# Patient Record
Sex: Male | Born: 1940
Health system: Southern US, Community
[De-identification: ages and names within clinical notes are randomized; demographics above are authoritative.]

## PROBLEM LIST (undated history)

## (undated) DIAGNOSIS — R319 Hematuria, unspecified: Secondary | ICD-10-CM

## (undated) DIAGNOSIS — N529 Male erectile dysfunction, unspecified: Secondary | ICD-10-CM

## (undated) DIAGNOSIS — M1611 Unilateral primary osteoarthritis, right hip: Secondary | ICD-10-CM

## (undated) DIAGNOSIS — M1612 Unilateral primary osteoarthritis, left hip: Secondary | ICD-10-CM

## (undated) DIAGNOSIS — K219 Gastro-esophageal reflux disease without esophagitis: Secondary | ICD-10-CM

## (undated) DIAGNOSIS — N281 Cyst of kidney, acquired: Secondary | ICD-10-CM

## (undated) DIAGNOSIS — R519 Headache, unspecified: Secondary | ICD-10-CM

## (undated) DIAGNOSIS — N400 Enlarged prostate without lower urinary tract symptoms: Secondary | ICD-10-CM

## (undated) DIAGNOSIS — R51 Headache: Secondary | ICD-10-CM

## (undated) DIAGNOSIS — C4490 Unspecified malignant neoplasm of skin, unspecified: Secondary | ICD-10-CM

## (undated) DIAGNOSIS — J45909 Unspecified asthma, uncomplicated: Secondary | ICD-10-CM

## (undated) DIAGNOSIS — M1990 Unspecified osteoarthritis, unspecified site: Secondary | ICD-10-CM

## (undated) DIAGNOSIS — I10 Essential (primary) hypertension: Secondary | ICD-10-CM

## (undated) DIAGNOSIS — C61 Malignant neoplasm of prostate: Secondary | ICD-10-CM

## (undated) DIAGNOSIS — R911 Solitary pulmonary nodule: Secondary | ICD-10-CM

## (undated) DIAGNOSIS — D179 Benign lipomatous neoplasm, unspecified: Secondary | ICD-10-CM

## (undated) DIAGNOSIS — Z974 Presence of external hearing-aid: Secondary | ICD-10-CM

## (undated) DIAGNOSIS — J302 Other seasonal allergic rhinitis: Secondary | ICD-10-CM

## (undated) DIAGNOSIS — Z87438 Personal history of other diseases of male genital organs: Secondary | ICD-10-CM

## (undated) HISTORY — PX: LIPOMA EXCISION: SHX5283

## (undated) HISTORY — PX: SHOULDER ARTHROSCOPY: SHX128

## (undated) HISTORY — PX: TONSILLECTOMY: SUR1361

## (undated) HISTORY — PX: KNEE ARTHROSCOPY: SUR90

---

## 1998-01-17 ENCOUNTER — Ambulatory Visit (HOSPITAL_BASED_OUTPATIENT_CLINIC_OR_DEPARTMENT_OTHER): Admission: RE | Admit: 1998-01-17 | Discharge: 1998-01-17 | Payer: Self-pay | Admitting: Orthopedic Surgery

## 1999-01-09 ENCOUNTER — Ambulatory Visit (HOSPITAL_BASED_OUTPATIENT_CLINIC_OR_DEPARTMENT_OTHER): Admission: RE | Admit: 1999-01-09 | Discharge: 1999-01-09 | Payer: Self-pay | Admitting: General Surgery

## 2003-01-12 DIAGNOSIS — J45909 Unspecified asthma, uncomplicated: Secondary | ICD-10-CM

## 2003-01-12 HISTORY — DX: Unspecified asthma, uncomplicated: J45.909

## 2003-02-16 ENCOUNTER — Ambulatory Visit (HOSPITAL_COMMUNITY): Admission: RE | Admit: 2003-02-16 | Discharge: 2003-02-16 | Payer: Self-pay | Admitting: Family Medicine

## 2008-04-15 DIAGNOSIS — C4491 Basal cell carcinoma of skin, unspecified: Secondary | ICD-10-CM

## 2008-04-15 HISTORY — DX: Basal cell carcinoma of skin, unspecified: C44.91

## 2009-01-27 DIAGNOSIS — C4492 Squamous cell carcinoma of skin, unspecified: Secondary | ICD-10-CM

## 2009-01-27 HISTORY — DX: Squamous cell carcinoma of skin, unspecified: C44.92

## 2012-05-05 ENCOUNTER — Other Ambulatory Visit: Payer: Self-pay | Admitting: Orthopedic Surgery

## 2012-05-17 ENCOUNTER — Encounter (HOSPITAL_COMMUNITY): Payer: Self-pay | Admitting: Pharmacy Technician

## 2012-05-17 ENCOUNTER — Other Ambulatory Visit (HOSPITAL_COMMUNITY): Payer: Self-pay | Admitting: Orthopedic Surgery

## 2012-05-17 NOTE — Patient Instructions (Addendum)
20 URAL ACREE  05/17/2012   Your procedure is scheduled on: 05-23-2012  Report to [facility] at 530 AM.  Call this number if you have problems the morning of surgery [phone]   Remember:   Do not eat food or drink liquids :After Midnight.   Monday night     Take these medicines the morning of surgery with A SIP OF WATER: Zantac                                SEE [facility] PREPARING FOR SURGERY SHEET   Do not wear jewelry, make-up or nail polish.  Do not wear lotions, powders, or perfumes. You may wear deodorant.   Men may shave face and neck.  Do not bring valuables to the hospital.  Contacts, dentures or bridgework may not be worn into surgery.  Leave suitcase in the car. After surgery it may be brought to your room.  For patients admitted to the hospital, checkout time is 11:00 AM the day of discharge.   Patients discharged the day of surgery will not be allowed to drive home.  Name and phone number of your driver:  Special Instructions: N/A   Please read over the following fact sheets that you were given: MRSA Information., blood fact sheet, incentive spirometer fact sheet Call Theodis Aguas RN pre op nurse if needed [phone]    FAILURE TO FOLLOW THESE INSTRUCTIONS MAY RESULT IN THE CANCELLATION OF YOUR SURGERY. PATIENT SIGNATURE___________________________________________

## 2012-05-18 ENCOUNTER — Ambulatory Visit (HOSPITAL_COMMUNITY)
Admission: RE | Admit: 2012-05-18 | Discharge: 2012-05-18 | Disposition: A | Discharge status: Home / Self Care | Payer: Medicare Other | Source: Ambulatory Visit | Attending: Orthopedic Surgery | Admitting: Orthopedic Surgery

## 2012-05-18 ENCOUNTER — Encounter (HOSPITAL_COMMUNITY): Payer: Self-pay

## 2012-05-18 ENCOUNTER — Encounter (HOSPITAL_COMMUNITY)
Admission: RE | Admit: 2012-05-18 | Discharge: 2012-05-18 | Disposition: A | Discharge status: Home / Self Care | Payer: Medicare Other | Source: Ambulatory Visit | Attending: Orthopedic Surgery | Admitting: Orthopedic Surgery

## 2012-05-18 DIAGNOSIS — Z01810 Encounter for preprocedural cardiovascular examination: Secondary | ICD-10-CM | POA: Insufficient documentation

## 2012-05-18 DIAGNOSIS — Z01818 Encounter for other preprocedural examination: Secondary | ICD-10-CM | POA: Insufficient documentation

## 2012-05-18 DIAGNOSIS — Z01812 Encounter for preprocedural laboratory examination: Secondary | ICD-10-CM | POA: Insufficient documentation

## 2012-05-18 DIAGNOSIS — I10 Essential (primary) hypertension: Secondary | ICD-10-CM | POA: Insufficient documentation

## 2012-05-18 DIAGNOSIS — Z0183 Encounter for blood typing: Secondary | ICD-10-CM | POA: Insufficient documentation

## 2012-05-18 HISTORY — DX: Essential (primary) hypertension: I10

## 2012-05-18 HISTORY — DX: Gastro-esophageal reflux disease without esophagitis: K21.9

## 2012-05-18 HISTORY — DX: Unspecified asthma, uncomplicated: J45.909

## 2012-05-18 HISTORY — DX: Unspecified osteoarthritis, unspecified site: M19.90

## 2012-05-18 LAB — CBC
HCT: 43 % (ref 39.0–52.0)
Hemoglobin: 14 g/dL (ref 13.0–17.0)
MCV: 95.1 fL (ref 78.0–100.0)
WBC: 7.2 10*3/uL (ref 4.0–10.5)

## 2012-05-18 LAB — BASIC METABOLIC PANEL
BUN: 15 mg/dL (ref 6–23)
CO2: 25 mEq/L (ref 19–32)
Chloride: 105 mEq/L (ref 96–112)
GFR calc Af Amer: >90 mL/min (ref >90)
Potassium: 3.9 mEq/L (ref 3.5–5.1)

## 2012-05-18 LAB — URINALYSIS, ROUTINE W REFLEX MICROSCOPIC
Ketones, ur: NEGATIVE mg/dL
Leukocytes, UA: NEGATIVE
Nitrite: NEGATIVE
Protein, ur: NEGATIVE mg/dL
Urobilinogen, UA: 0.2 mg/dL (ref 0.0–1.0)

## 2012-05-18 LAB — PROTIME-INR: INR: 0.89 (ref 0.00–1.49)

## 2012-05-18 NOTE — Progress Notes (Signed)
Faxed request for antibiotic clarification pre op to Dr Dion Saucier with confirmation that patient had anaphylatic  Reaction  to Penicillin as a child and Ancef is ordered pre op

## 2012-05-22 LAB — TYPE AND SCREEN: ABO/RH(D): B POS

## 2012-05-23 ENCOUNTER — Encounter (HOSPITAL_COMMUNITY): Payer: Self-pay | Admitting: *Deleted

## 2012-05-23 ENCOUNTER — Inpatient Hospital Stay (HOSPITAL_COMMUNITY)
Admission: RE | Admit: 2012-05-23 | Discharge: 2012-05-25 | DRG: 470 | Disposition: A | Discharge status: Home Health | Payer: Medicare Other | Source: Ambulatory Visit | Attending: Orthopedic Surgery | Admitting: Orthopedic Surgery

## 2012-05-23 ENCOUNTER — Encounter (HOSPITAL_COMMUNITY)
Admission: RE | Disposition: A | Discharge status: Home Health | Payer: Self-pay | Source: Ambulatory Visit | Attending: Orthopedic Surgery

## 2012-05-23 ENCOUNTER — Inpatient Hospital Stay (HOSPITAL_COMMUNITY): Payer: Medicare Other

## 2012-05-23 ENCOUNTER — Inpatient Hospital Stay (HOSPITAL_COMMUNITY): Payer: Medicare Other | Admitting: Certified Registered"

## 2012-05-23 ENCOUNTER — Encounter (HOSPITAL_COMMUNITY): Payer: Self-pay | Admitting: Certified Registered"

## 2012-05-23 DIAGNOSIS — Z79899 Other long term (current) drug therapy: Secondary | ICD-10-CM

## 2012-05-23 DIAGNOSIS — M1610 Unilateral primary osteoarthritis, unspecified hip: Principal | ICD-10-CM | POA: Diagnosis present

## 2012-05-23 DIAGNOSIS — K219 Gastro-esophageal reflux disease without esophagitis: Secondary | ICD-10-CM | POA: Diagnosis present

## 2012-05-23 DIAGNOSIS — M169 Osteoarthritis of hip, unspecified: Principal | ICD-10-CM | POA: Diagnosis present

## 2012-05-23 DIAGNOSIS — Z87891 Personal history of nicotine dependence: Secondary | ICD-10-CM

## 2012-05-23 DIAGNOSIS — I10 Essential (primary) hypertension: Secondary | ICD-10-CM | POA: Diagnosis present

## 2012-05-23 DIAGNOSIS — M1611 Unilateral primary osteoarthritis, right hip: Secondary | ICD-10-CM

## 2012-05-23 HISTORY — DX: Unilateral primary osteoarthritis, right hip: M16.11

## 2012-05-23 HISTORY — PX: TOTAL HIP ARTHROPLASTY: SHX124

## 2012-05-23 LAB — CBC
Hemoglobin: 13 g/dL (ref 13.0–17.0)
MCH: 31.6 pg (ref 26.0–34.0)
MCHC: 33 g/dL (ref 30.0–36.0)
RDW: 15.1 % (ref 11.5–15.5)

## 2012-05-23 LAB — CREATININE, SERUM: Creatinine, Ser: 0.78 mg/dL (ref 0.50–1.35)

## 2012-05-23 SURGERY — ARTHROPLASTY, HIP, TOTAL,POSTERIOR APPROACH
Anesthesia: General | Site: Hip | Laterality: Right | Wound class: Clean

## 2012-05-23 MED ORDER — ACETAMINOPHEN 10 MG/ML IV SOLN: 1000.0000 mg | Freq: Once | INTRAVENOUS | Status: DC | PRN

## 2012-05-23 MED ORDER — WARFARIN SODIUM 5 MG PO TABS
5.0000 mg | ORAL_TABLET | Freq: Once | ORAL | Status: AC
  Administered 2012-05-23: 5 mg via ORAL
  Filled 2012-05-23: qty 1

## 2012-05-23 MED ORDER — LIDOCAINE HCL (PF) 2 % IJ SOLN
INTRAMUSCULAR | Status: DC | PRN
  Administered 2012-05-23: 20 mg

## 2012-05-23 MED ORDER — ZOLPIDEM TARTRATE 5 MG PO TABS: 5.0000 mg | ORAL_TABLET | Freq: Every evening | ORAL | Status: DC | PRN

## 2012-05-23 MED ORDER — SENNA-DOCUSATE SODIUM 8.6-50 MG PO TABS: 1.0000 | ORAL_TABLET | Freq: Every day | ORAL | Status: DC

## 2012-05-23 MED ORDER — OXYCODONE HCL 5 MG PO TABS
5.0000 mg | ORAL_TABLET | ORAL | Status: DC | PRN
  Administered 2012-05-23 – 2012-05-25 (×15): 10 mg via ORAL
  Filled 2012-05-23 (×14): qty 2

## 2012-05-23 MED ORDER — LACTATED RINGERS IV SOLN
INTRAVENOUS | Status: DC | PRN
  Administered 2012-05-23 (×2): via INTRAVENOUS

## 2012-05-23 MED ORDER — SUCCINYLCHOLINE CHLORIDE 20 MG/ML IJ SOLN
INTRAMUSCULAR | Status: DC | PRN
  Administered 2012-05-23: 100 mg via INTRAVENOUS

## 2012-05-23 MED ORDER — POLYETHYLENE GLYCOL 3350 17 G PO PACK
17.0000 g | PACK | Freq: Every day | ORAL | Status: DC | PRN
  Administered 2012-05-25: 17 g via ORAL

## 2012-05-23 MED ORDER — POTASSIUM CHLORIDE IN NACL 20-0.45 MEQ/L-% IV SOLN
INTRAVENOUS | Status: DC
  Administered 2012-05-23: 12:00:00 via INTRAVENOUS
  Filled 2012-05-23 (×8): qty 1000

## 2012-05-23 MED ORDER — ENOXAPARIN SODIUM 30 MG/0.3ML ~~LOC~~ SOLN
30.0000 mg | Freq: Two times a day (BID) | SUBCUTANEOUS | Status: DC
  Filled 2012-05-23 (×3): qty 0.3

## 2012-05-23 MED ORDER — DOCUSATE SODIUM 100 MG PO CAPS
100.0000 mg | ORAL_CAPSULE | Freq: Two times a day (BID) | ORAL | Status: DC
  Administered 2012-05-23 – 2012-05-25 (×4): 100 mg via ORAL

## 2012-05-23 MED ORDER — ALUM & MAG HYDROXIDE-SIMETH 200-200-20 MG/5ML PO SUSP: 30.0000 mL | ORAL | Status: DC | PRN

## 2012-05-23 MED ORDER — CEFAZOLIN SODIUM-DEXTROSE 2-3 GM-% IV SOLR
2.0000 g | INTRAVENOUS | Status: DC
  Administered 2012-05-23: 2 g via INTRAVENOUS

## 2012-05-23 MED ORDER — BISACODYL 10 MG RE SUPP: 10.0000 mg | Freq: Every day | RECTAL | Status: DC | PRN

## 2012-05-23 MED ORDER — HYDROMORPHONE HCL PF 1 MG/ML IJ SOLN
0.2500 mg | INTRAMUSCULAR | Status: DC | PRN
  Administered 2012-05-23 (×4): 0.5 mg via INTRAVENOUS

## 2012-05-23 MED ORDER — METOCLOPRAMIDE HCL 10 MG PO TABS: 5.0000 mg | ORAL_TABLET | Freq: Three times a day (TID) | ORAL | Status: DC | PRN

## 2012-05-23 MED ORDER — BISACODYL 5 MG PO TBEC: 5.0000 mg | DELAYED_RELEASE_TABLET | Freq: Every day | ORAL | Status: DC | PRN

## 2012-05-23 MED ORDER — 0.9 % SODIUM CHLORIDE (POUR BTL) OPTIME
TOPICAL | Status: DC | PRN
  Administered 2012-05-23: 1000 mL

## 2012-05-23 MED ORDER — DEXAMETHASONE SODIUM PHOSPHATE 10 MG/ML IJ SOLN
INTRAMUSCULAR | Status: DC | PRN
  Administered 2012-05-23: 10 mg via INTRAVENOUS

## 2012-05-23 MED ORDER — CALCIUM CARBONATE 600 MG PO TABS
600.0000 mg | ORAL_TABLET | Freq: Every day | ORAL | Status: DC
  Filled 2012-05-23: qty 1

## 2012-05-23 MED ORDER — FENTANYL CITRATE 0.05 MG/ML IJ SOLN
INTRAMUSCULAR | Status: DC | PRN
  Administered 2012-05-23: 100 ug via INTRAVENOUS
  Administered 2012-05-23 (×2): 50 ug via INTRAVENOUS

## 2012-05-23 MED ORDER — ACETAMINOPHEN 650 MG RE SUPP: 650.0000 mg | Freq: Four times a day (QID) | RECTAL | Status: DC | PRN

## 2012-05-23 MED ORDER — ONDANSETRON HCL 4 MG PO TABS: 4.0000 mg | ORAL_TABLET | Freq: Four times a day (QID) | ORAL | Status: DC | PRN

## 2012-05-23 MED ORDER — LACTATED RINGERS IV SOLN: INTRAVENOUS | Status: DC

## 2012-05-23 MED ORDER — CALCIUM CARBONATE 1250 (500 CA) MG PO TABS
1.0000 | ORAL_TABLET | Freq: Every day | ORAL | Status: DC
  Administered 2012-05-24 – 2012-05-25 (×2): 500 mg via ORAL
  Filled 2012-05-23 (×4): qty 1

## 2012-05-23 MED ORDER — ONDANSETRON HCL 4 MG/2ML IJ SOLN
INTRAMUSCULAR | Status: DC | PRN
  Administered 2012-05-23: 4 mg via INTRAVENOUS

## 2012-05-23 MED ORDER — DIPHENHYDRAMINE HCL 12.5 MG/5ML PO ELIX
12.5000 mg | ORAL_SOLUTION | ORAL | Status: DC | PRN
  Administered 2012-05-24: 12.5 mg via ORAL
  Filled 2012-05-23: qty 10

## 2012-05-23 MED ORDER — OXYCODONE HCL 5 MG/5ML PO SOLN: 5.0000 mg | Freq: Once | ORAL | Status: DC | PRN

## 2012-05-23 MED ORDER — CEFAZOLIN SODIUM-DEXTROSE 2-3 GM-% IV SOLR
2.0000 g | Freq: Four times a day (QID) | INTRAVENOUS | Status: AC
  Administered 2012-05-23 (×2): 2 g via INTRAVENOUS
  Filled 2012-05-23 (×2): qty 50

## 2012-05-23 MED ORDER — MEPERIDINE HCL 50 MG/ML IJ SOLN: 6.2500 mg | INTRAMUSCULAR | Status: DC | PRN

## 2012-05-23 MED ORDER — LISINOPRIL 20 MG PO TABS
20.0000 mg | ORAL_TABLET | Freq: Every morning | ORAL | Status: DC
  Administered 2012-05-24 – 2012-05-25 (×2): 20 mg via ORAL
  Filled 2012-05-23 (×3): qty 1

## 2012-05-23 MED ORDER — EPHEDRINE SULFATE 50 MG/ML IJ SOLN
INTRAMUSCULAR | Status: DC | PRN
  Administered 2012-05-23 (×2): 10 mg via INTRAVENOUS

## 2012-05-23 MED ORDER — SENNA 8.6 MG PO TABS
1.0000 | ORAL_TABLET | Freq: Two times a day (BID) | ORAL | Status: DC
  Administered 2012-05-23 – 2012-05-25 (×4): 8.6 mg via ORAL
  Filled 2012-05-23 (×4): qty 1

## 2012-05-23 MED ORDER — OXYCODONE-ACETAMINOPHEN 10-325 MG PO TABS: 1.0000 | ORAL_TABLET | Freq: Four times a day (QID) | ORAL | Status: DC | PRN

## 2012-05-23 MED ORDER — ONDANSETRON HCL 4 MG/2ML IJ SOLN: 4.0000 mg | Freq: Four times a day (QID) | INTRAMUSCULAR | Status: DC | PRN

## 2012-05-23 MED ORDER — METOCLOPRAMIDE HCL 5 MG/ML IJ SOLN: 5.0000 mg | Freq: Three times a day (TID) | INTRAMUSCULAR | Status: DC | PRN

## 2012-05-23 MED ORDER — HYDROMORPHONE HCL PF 1 MG/ML IJ SOLN: 0.5000 mg | INTRAMUSCULAR | Status: DC | PRN

## 2012-05-23 MED ORDER — NEOSTIGMINE METHYLSULFATE 1 MG/ML IJ SOLN
INTRAMUSCULAR | Status: DC | PRN
  Administered 2012-05-23: 3.5 mg via INTRAVENOUS

## 2012-05-23 MED ORDER — PROMETHAZINE HCL 25 MG/ML IJ SOLN: 6.2500 mg | INTRAMUSCULAR | Status: DC | PRN

## 2012-05-23 MED ORDER — ADULT MULTIVITAMIN W/MINERALS CH
1.0000 | ORAL_TABLET | Freq: Every day | ORAL | Status: DC
  Administered 2012-05-24 – 2012-05-25 (×2): 1 via ORAL
  Filled 2012-05-23 (×3): qty 1

## 2012-05-23 MED ORDER — GLYCOPYRROLATE 0.2 MG/ML IJ SOLN
INTRAMUSCULAR | Status: DC | PRN
  Administered 2012-05-23: .5 mg via INTRAVENOUS

## 2012-05-23 MED ORDER — WARFARIN - PHARMACIST DOSING INPATIENT: Freq: Every day | Status: DC

## 2012-05-23 MED ORDER — OXYCODONE HCL 5 MG PO TABS: 5.0000 mg | ORAL_TABLET | Freq: Once | ORAL | Status: DC | PRN

## 2012-05-23 MED ORDER — MENTHOL 3 MG MT LOZG: 1.0000 | LOZENGE | OROMUCOSAL | Status: DC | PRN

## 2012-05-23 MED ORDER — PROPOFOL 10 MG/ML IV BOLUS
INTRAVENOUS | Status: DC | PRN
  Administered 2012-05-23: 200 mg via INTRAVENOUS

## 2012-05-23 MED ORDER — ACETAMINOPHEN 10 MG/ML IV SOLN
1000.0000 mg | Freq: Four times a day (QID) | INTRAVENOUS | Status: AC
  Administered 2012-05-23 – 2012-05-24 (×4): 1000 mg via INTRAVENOUS
  Filled 2012-05-23 (×5): qty 100

## 2012-05-23 MED ORDER — DEXTROSE 5 % IV SOLN
500.0000 mg | Freq: Four times a day (QID) | INTRAVENOUS | Status: DC | PRN
  Filled 2012-05-23: qty 5

## 2012-05-23 MED ORDER — WARFARIN SODIUM 5 MG PO TABS: 5.0000 mg | ORAL_TABLET | Freq: Every day | ORAL | Status: DC

## 2012-05-23 MED ORDER — MIDAZOLAM HCL 5 MG/5ML IJ SOLN
INTRAMUSCULAR | Status: DC | PRN
  Administered 2012-05-23: 2 mg via INTRAVENOUS

## 2012-05-23 MED ORDER — FAMOTIDINE 20 MG PO TABS
20.0000 mg | ORAL_TABLET | Freq: Every day | ORAL | Status: DC
  Administered 2012-05-24 – 2012-05-25 (×2): 20 mg via ORAL
  Filled 2012-05-23 (×4): qty 1

## 2012-05-23 MED ORDER — ACETAMINOPHEN 325 MG PO TABS: 650.0000 mg | ORAL_TABLET | Freq: Four times a day (QID) | ORAL | Status: DC | PRN

## 2012-05-23 MED ORDER — COUMADIN BOOK
Freq: Once | Status: AC
  Administered 2012-05-23: 18:00:00
  Filled 2012-05-23: qty 1

## 2012-05-23 MED ORDER — KETOROLAC TROMETHAMINE 15 MG/ML IJ SOLN
7.5000 mg | Freq: Four times a day (QID) | INTRAMUSCULAR | Status: AC
  Administered 2012-05-23 – 2012-05-24 (×4): 7.5 mg via INTRAVENOUS
  Filled 2012-05-23 (×4): qty 1

## 2012-05-23 MED ORDER — ROCURONIUM BROMIDE 100 MG/10ML IV SOLN
INTRAVENOUS | Status: DC | PRN
  Administered 2012-05-23: 10 mg via INTRAVENOUS
  Administered 2012-05-23: 50 mg via INTRAVENOUS

## 2012-05-23 MED ORDER — PHENOL 1.4 % MT LIQD: 1.0000 | OROMUCOSAL | Status: DC | PRN

## 2012-05-23 MED ORDER — ACETAMINOPHEN 10 MG/ML IV SOLN
INTRAVENOUS | Status: DC | PRN
  Administered 2012-05-23: 1000 mg via INTRAVENOUS

## 2012-05-23 MED ORDER — WARFARIN VIDEO: Freq: Once | Status: DC

## 2012-05-23 MED ORDER — HYDROMORPHONE HCL PF 1 MG/ML IJ SOLN
INTRAMUSCULAR | Status: DC | PRN
  Administered 2012-05-23 (×2): 1 mg via INTRAVENOUS

## 2012-05-23 MED ORDER — METHOCARBAMOL 500 MG PO TABS
500.0000 mg | ORAL_TABLET | Freq: Four times a day (QID) | ORAL | Status: DC | PRN
  Administered 2012-05-23 – 2012-05-25 (×7): 500 mg via ORAL
  Filled 2012-05-23 (×7): qty 1

## 2012-05-23 SURGICAL SUPPLY — 74 items
BENZOIN TINCTURE PRP APPL 2/3 (GAUZE/BANDAGES/DRESSINGS) ×2 IMPLANT
BIT DRILL 2.4X128 (BIT) ×2 IMPLANT
BLADE SAW SAG 73X25 THK (BLADE) ×1
BLADE SAW SGTL 73X25 THK (BLADE) ×1 IMPLANT
BLADE SURG SZ10 CARB STEEL (BLADE) ×2 IMPLANT
BRUSH FEMORAL CANAL (MISCELLANEOUS) IMPLANT
CANISTER SUCTION 2500CC (MISCELLANEOUS) ×2 IMPLANT
CLOTH BEACON ORANGE TIMEOUT ST (SAFETY) ×2 IMPLANT
DRAPE INCISE IOBAN 66X45 STRL (DRAPES) ×2 IMPLANT
DRAPE ORTHO SPLIT 77X108 STRL (DRAPES) ×2
DRAPE POUCH INSTRU U-SHP 10X18 (DRAPES) ×2 IMPLANT
DRAPE SURG ORHT 6 SPLT 77X108 (DRAPES) ×2 IMPLANT
DRAPE U-SHAPE 47X51 STRL (DRAPES) ×2 IMPLANT
DRSG MEPILEX BORDER 4X8 (GAUZE/BANDAGES/DRESSINGS) ×2 IMPLANT
DRSG PAD ABDOMINAL 8X10 ST (GAUZE/BANDAGES/DRESSINGS) ×4 IMPLANT
DURAPREP 26ML APPLICATOR (WOUND CARE) ×2 IMPLANT
ELECT BLADE 6.5 EXT (BLADE) ×2 IMPLANT
ELECT BLADE TIP CTD 4 INCH (ELECTRODE) ×2 IMPLANT
ELECT CAUTERY BLADE 6.4 (BLADE) IMPLANT
ELECT REM PT RETURN 9FT ADLT (ELECTROSURGICAL) ×2
ELECTRODE REM PT RTRN 9FT ADLT (ELECTROSURGICAL) ×1 IMPLANT
EVACUATOR 1/8 PVC DRAIN (DRAIN) IMPLANT
FACESHIELD LNG OPTICON STERILE (SAFETY) ×6 IMPLANT
GAUZE XEROFORM 5X9 LF (GAUZE/BANDAGES/DRESSINGS) IMPLANT
GLOVE BIOGEL PI IND STRL 8 (GLOVE) ×2 IMPLANT
GLOVE BIOGEL PI INDICATOR 8 (GLOVE) ×2
GLOVE ECLIPSE 7.5 STRL STRAW (GLOVE) ×2 IMPLANT
GLOVE ORTHO TXT STRL SZ7.5 (GLOVE) IMPLANT
GLOVE SURG ORTHO 8.0 STRL STRW (GLOVE) ×2 IMPLANT
GLOVE SURG SS PI 7.5 STRL IVOR (GLOVE) ×8 IMPLANT
GOWN BRE IMP PREV XXLGXLNG (GOWN DISPOSABLE) ×2 IMPLANT
GOWN STRL NON-REIN LRG LVL3 (GOWN DISPOSABLE) ×2 IMPLANT
GOWN STRL REIN XL XLG (GOWN DISPOSABLE) ×4 IMPLANT
HANDPIECE INTERPULSE COAX TIP (DISPOSABLE)
HOOD PEEL AWAY FACE SHEILD DIS (HOOD) ×2 IMPLANT
KIT BASIN OR (CUSTOM PROCEDURE TRAY) ×2 IMPLANT
MANIFOLD NEPTUNE II (INSTRUMENTS) ×2 IMPLANT
NEEDLE HYPO 22GX1.5 SAFETY (NEEDLE) IMPLANT
NEEDLE MAYO .5 CIRCLE (NEEDLE) ×2 IMPLANT
NS IRRIG 1000ML POUR BTL (IV SOLUTION) ×4 IMPLANT
PACK TOTAL JOINT (CUSTOM PROCEDURE TRAY) ×2 IMPLANT
PASSER SUT SWANSON 36MM LOOP (INSTRUMENTS) ×2 IMPLANT
PILLOW ABDUCTION HIP (SOFTGOODS) ×2 IMPLANT
POSITIONER SURGICAL ARM (MISCELLANEOUS) ×2 IMPLANT
PRESSURIZER FEMORAL UNIV (MISCELLANEOUS) IMPLANT
SET HNDPC FAN SPRY TIP SCT (DISPOSABLE) IMPLANT
SPONGE GAUZE 4X4 12PLY (GAUZE/BANDAGES/DRESSINGS) ×2 IMPLANT
SPONGE LAP 18X18 X RAY DECT (DISPOSABLE) IMPLANT
SPONGE LAP 4X18 X RAY DECT (DISPOSABLE) IMPLANT
STAPLER VISISTAT 35W (STAPLE) IMPLANT
STRIP CLOSURE SKIN 1/2X4 (GAUZE/BANDAGES/DRESSINGS) ×2 IMPLANT
SUCTION FRAZIER 12FR DISP (SUCTIONS) ×2 IMPLANT
SUT ETHIBOND NAB CT1 #1 30IN (SUTURE) ×4 IMPLANT
SUT FIBERWIRE #2 38 T-5 BLUE (SUTURE) ×10
SUT MNCRL AB 4-0 PS2 18 (SUTURE) ×2 IMPLANT
SUT VIC AB 0 CT1 18XCR BRD 8 (SUTURE) IMPLANT
SUT VIC AB 0 CT1 36 (SUTURE) ×2 IMPLANT
SUT VIC AB 0 CT1 8-18 (SUTURE)
SUT VIC AB 1 CT1 27 (SUTURE)
SUT VIC AB 1 CT1 27XBRD ANTBC (SUTURE) IMPLANT
SUT VIC AB 1 CTX 36 (SUTURE)
SUT VIC AB 1 CTX36XBRD ANBCTR (SUTURE) IMPLANT
SUT VIC AB 2-0 CT1 18 (SUTURE) IMPLANT
SUT VIC AB 2-0 CT1 27 (SUTURE) ×1
SUT VIC AB 2-0 CT1 TAPERPNT 27 (SUTURE) ×1 IMPLANT
SUT VIC AB 3-0 SH 27 (SUTURE)
SUT VIC AB 3-0 SH 27XBRD (SUTURE) IMPLANT
SUT VIC AB 3-0 SH 8-18 (SUTURE) ×2 IMPLANT
SUTURE FIBERWR #2 38 T-5 BLUE (SUTURE) ×5 IMPLANT
SYR 30ML LL (SYRINGE) IMPLANT
TOWEL OR 17X26 10 PK STRL BLUE (TOWEL DISPOSABLE) ×4 IMPLANT
TOWER CARTRIDGE SMART MIX (DISPOSABLE) IMPLANT
TRAY FOLEY CATH 14FRSI W/METER (CATHETERS) ×2 IMPLANT
WATER STERILE IRR 1500ML POUR (IV SOLUTION) ×2 IMPLANT

## 2012-05-23 NOTE — Plan of Care (Signed)
Problem: Consults Goal: Diagnosis- Total Joint Replacement Right total hip     

## 2012-05-23 NOTE — Progress Notes (Signed)
ANTICOAGULATION CONSULT NOTE - Initial Consult  Pharmacy Consult for Coumadin Indication: VTE prophylaxis  Allergies  Allergen Reactions  . Penicillins Anaphylaxis    Childhood allergy     Patient Measurements:    Vital Signs: Temp: 97.8 F (36.6 C) (05/13 1127) Temp src: Oral (05/13 0525) BP: 147/85 mmHg (05/13 1127) Pulse Rate: 71 (05/13 1127)  Labs: No results found for this basename: HGB, HCT, PLT, APTT, LABPROT, INR, HEPARINUNFRC, CREATININE, CKTOTAL, CKMB, TROPONINI,  in the last 72 hours  CrCl is unknown because there is no height on file for the current visit.   Medical History: Past Medical History  Diagnosis Date  . Hypertension   . Asthma 2005    "attack x 1"  . GERD (gastroesophageal reflux disease)   . Arthritis   . Osteoarthritis of right hip 05/23/2012    Assessment:  72 yom not on any anticoagulant PTA presented for THA 5/13.  MD ordered to start Coumadin post-op for VTE prophylaxis.  Pt also has Lovenox 30 mg sq q12h ordered to start 5/14 AM until INR >/= 1.8  . INR and CBC wnl on 5/8.  Coumadin score = 4.   Coumadin 5mg  po x 1 tonight  Of note, patient has Coumadin 5mg  po daily ordered for discharge - f/u and adjust as needed   Goal of Therapy:  INR 2-3 Monitor platelets by anticoagulation protocol: Yes   Plan:   Coumadin 5mg  po x 1 tonight  Continue Lovenox 30 mg sq q12h until INR >/= 1.8  Daily PT/INR  Coumadin book and video  Pharmacy will f/u with Coumadin education in AM  [place], PharmD, [place] Pager: [phone] 11:44 AM Pharmacy #: [id number]

## 2012-05-23 NOTE — Op Note (Signed)
05/23/2012  9:40 AM  PATIENT:  Frederick Hall   MRN: [id number]  PRE-OPERATIVE DIAGNOSIS:  DJD RIGHT HIP  POST-OPERATIVE DIAGNOSIS:  DJD RIGHT HIP  PROCEDURE:  Procedure(s): TOTAL HIP ARTHROPLASTY  PREOPERATIVE INDICATIONS:    Frederick Hall is an 72 y.o. male who has a diagnosis of Osteoarthritis of right hip and elected for surgical management after failing conservative treatment.  The risks benefits and alternatives were discussed with the patient including but not limited to the risks of nonoperative treatment, versus surgical intervention including infection, bleeding, nerve injury, periprosthetic fracture, the need for revision surgery, dislocation, leg length discrepancy, blood clots, cardiopulmonary complications, morbidity, mortality, among others, and they were willing to proceed.     OPERATIVE REPORT     SURGEON:  Teryl Lucy, MD    ASSISTANT:  Janace Litten, OPA-C  (Present throughout the entire procedure,  necessary for completion of procedure in a timely manner, assisting with retraction, instrumentation, and closure)     ANESTHESIA:  General    COMPLICATIONS:  None.     COMPONENTS:  [employer] fit femur size 5 high off set with a 36 mm +8.5 head ball and a gription acetabular shell size 56 with a 10 degree lipped polyethylene liner and a single size 30 mm cancellus screw, with a hole eliminator for the cup.    PROCEDURE IN DETAIL:   The patient was met in the holding area and  identified.  The appropriate hip was identified and marked at the operative site.  The patient was then transported to the OR  and  placed under general anesthesia.  At that point, the patient was  placed in the lateral decubitus position with the operative side up and  secured to the operating room table and all bony prominences padded.     The operative lower extremity was prepped from the iliac crest to the distal leg.  Sterile draping was performed.  Time out was performed  prior to incision.      A routine posterolateral approach was utilized via sharp dissection  carried down to the subcutaneous tissue.  Gross bleeders were Bovie coagulated.  The iliotibial band was identified and incised along the length of the skin incision.  Self-retaining retractors were  inserted.  With the hip internally rotated, the short external rotators  were identified. The piriformis and capsule was tagged with FiberWire, and the hip capsule released in a T-type fashion.  The femoral neck was exposed, and I resected the femoral neck using the appropriate jig. This was performed at approximately a thumb's breadth above the lesser trochanter. The neck itself had fairly significant offset, and the head was quite large, measuring at least a 51.    I then exposed the deep acetabulum, cleared out any tissue including the ligamentum teres.  After adequate visualization, I excised the labrum, and then sequentially reamed.  The acetabulum itself was fairly dysplastic, and the superior shelf was very vertical. I placed the trial acetabulum, which seated nicely, and then impacted the real cup into place.  Appropriate version and inclination was confirmed clinically matching their bony anatomy, and also with the use of the jig.  A trial polyethylene liner was placed and the retractors removed.    I then prepared the proximal femur using the cookie-cutter, the lateralizing reamer, and then sequentially reamed and broached.  A trial broach, neck, and head was utilized, and I reduced the hip and it was found to have excellent stability with  functional range of motion. The trial components were then removed, and I inspected the cup. Interestingly, the cup had loosened during the trialing process, and I was not satisfied with this, so I went back, exposed the acetabulum, reamed deeper into the pelvis, and then impacted the cup again. I did have cancellus bone this time, and excellent seating, and placed a 30  mm screw in the superior posterior position to ensure fixation of the cup. The drill seemed to go bicortically, although I used the short drill length. I did confirm that I was in the safe zone, and placed the screw. I had excellent purchase. The apex hole in the near was placed, and the real polyethylene liner was placed with the lip directed posteriorly.  I then impacted the real femoral prosthesis into place into the appropriate version, slightly anteverted to the normal anatomy, and I impacted the real head ball into place. The hip was then reduced and taken through functional range of motion and found to have excellent stability. Leg lengths were restored, and in fact the right leg was slightly longer than the left, as would be expected given the left-sided coexisting osteoarthritis.  I then used a 2 mm drill bit to pass the FiberWire suture from the capsule and piriformis through the greater trochanter, and secured this. It was actually somewhat of a reach, to get the capsule back to the insertion, which may reflect slight increased soft tissue tension. I actually repaired this once, but was not happy with the soft tissue tension, and then took to repair out, and revise this. Satisfactory posterior capsular repair was then achieved. I also closed the T in the capsule.  I then irrigated the hip copiously again with pulse lavage, and repaired the fascia with Vicryl, followed by Vicryl for the subcutaneous tissue, Monocryl for the skin, Steri-Strips and sterile gauze. The wounds were injected. The patient was then awakened and returned to PACU in stable and satisfactory condition. There were no complications.  Teryl Lucy, MD Orthopedic Surgeon [phone]   05/23/2012 9:40 AM

## 2012-05-23 NOTE — Evaluation (Signed)
Physical Therapy Evaluation Patient Details Name: Frederick Hall MRN: [id number] DOB: 1940-06-18 Today's Date: 05/23/2012 Time: 9147-8295 PT Time Calculation (min): 17 min  PT Assessment / Plan / Recommendation Clinical Impression  Pt is a 72 year old male s/p R THR with posterior hip precautions.  Pt and spouse report pt being very active prior to surgery and hope to return to physically active lifestyle.  Pt would benefit from acute PT services in order to improve independence with transfers, ambulation and stairs while maintaining THP to prepare for d/c home with spouse.    PT Assessment  Patient needs continued PT services    Follow Up Recommendations  Home health PT    Does the patient have the potential to tolerate intense rehabilitation      Barriers to Discharge        Equipment Recommendations  None recommended by PT;Rolling walker with 5" wheels (pt reports RW and BSC to be delivered to room)    Recommendations for Other Services     Frequency 7X/week    Precautions / Restrictions Precautions Precautions: Posterior Hip Precaution Comments: pt educated on posterior hip precautions and given handout Restrictions RLE Weight Bearing: Weight bearing as tolerated   Pertinent Vitals/Pain Pt reports minimal pain with mobility, ice pack applied to R hip     Mobility  Bed Mobility Bed Mobility: Supine to Sit Supine to Sit: 4: Min assist;HOB elevated Details for Bed Mobility Assistance: verbal cues of technique and precautions, assist for R LE Transfers Transfers: Sit to Stand;Stand to Sit Sit to Stand: 4: Min assist;With upper extremity assist;From bed Stand to Sit: 4: Min guard;With upper extremity assist;To chair/3-in-1 Details for Transfer Assistance: verbal cues for safe technique, assist to steady with rise Ambulation/Gait Ambulation/Gait Assistance: 4: Min guard Ambulation Distance (Feet): 60 Feet Assistive device: Rolling walker Ambulation/Gait Assistance  Details: verbal cues for sequence and RW distance, encouraged WBing through RW to prevent increased discomfort/soreness tomorrow Gait Pattern: Step-to pattern;Antalgic Gait velocity: decreased    Exercises     PT Diagnosis: Difficulty walking;Acute pain  PT Problem List: Decreased strength;Decreased mobility;Decreased range of motion;Decreased knowledge of use of DME;Decreased knowledge of precautions;Pain PT Treatment Interventions: Stair training;Gait training;DME instruction;Functional mobility training;Therapeutic activities;Therapeutic exercise;Patient/family education   PT Goals Acute Rehab PT Goals PT Goal Formulation: With patient Time For Goal Achievement: 05/30/12 Potential to Achieve Goals: Good Pt will go Supine/Side to Sit: with modified independence PT Goal: Supine/Side to Sit - Progress: Goal set today Pt will go Sit to Supine/Side: with modified independence PT Goal: Sit to Supine/Side - Progress: Goal set today Pt will go Sit to Stand: with modified independence PT Goal: Sit to Stand - Progress: Goal set today Pt will go Stand to Sit: with modified independence PT Goal: Stand to Sit - Progress: Goal set today Pt will Ambulate: 51 - 150 feet;with modified independence;with least restrictive assistive device PT Goal: Ambulate - Progress: Goal set today Pt will Go Up / Down Stairs: 6-9 stairs;with least restrictive assistive device;with rail(s);with supervision PT Goal: Up/Down Stairs - Progress: Goal set today Pt will Perform Home Exercise Program: with supervision, verbal cues required/provided PT Goal: Perform Home Exercise Program - Progress: Goal set today  Visit Information  Last PT Received On: 05/23/12 Assistance Needed: +1    Subjective Data  Subjective: Let's see how much weight I can put down.  (discussed WBAT and using UEs to prevent pain/soreness tomorrow)   Prior Functioning  Home Living Lives With: Spouse Type  of Home: House Home Access: Stairs to  enter [employer] of Steps: 8 Entrance Stairs-Rails: Right Home Layout: One level Home Adaptive Equipment: Crutches Additional Comments: RW and BSC to be delivered to room per pt Prior Function Level of Independence: Independent Communication Communication: No difficulties    Cognition  Cognition Arousal/Alertness: Awake/alert Behavior During Therapy: WFL for tasks assessed/performed Overall Cognitive Status: Within Functional Limits for tasks assessed    Extremity/Trunk Assessment Right Upper Extremity Assessment RUE ROM/Strength/Tone: [facility] for tasks assessed Left Upper Extremity Assessment LUE ROM/Strength/Tone: WFL for tasks assessed Right Lower Extremity Assessment RLE ROM/Strength/Tone: Deficits RLE ROM/Strength/Tone Deficits: unable to lift against gravitly for hip flexion and abduction requiring assist for bed mobility Left Lower Extremity Assessment LLE ROM/Strength/Tone: [facility] for tasks assessed   Balance    End of Session PT - End of Session Activity Tolerance: Patient tolerated treatment well Patient left: in chair;with call bell/phone within reach;with family/visitor present  GP     Shaun Zuccaro,KATHrine E 05/23/2012, 4:38 PM Zenovia Jarred, PT, DPT 05/23/2012 Pager: [phone]

## 2012-05-23 NOTE — Preoperative (Signed)
Beta Blockers   Reason not to administer Beta Blockers:Not Applicable 

## 2012-05-23 NOTE — Anesthesia Preprocedure Evaluation (Addendum)
Anesthesia Evaluation  Patient identified by MRN, date of birth, ID band Patient awake    Reviewed: Allergy & Precautions, H&P , NPO status , Patient's Chart, lab work & pertinent test results  Airway Mallampati: II TM Distance: >3 FB Neck ROM: Full    Dental  (+) Dental Advisory Given and Teeth Intact   Pulmonary asthma , former smoker,  breath sounds clear to auscultation        Cardiovascular hypertension, Pt. on medications Rhythm:Regular Rate:Normal     Neuro/Psych negative neurological ROS  negative psych ROS   GI/Hepatic Neg liver ROS, GERD-  Medicated,  Endo/Other  negative endocrine ROS  Renal/GU negative Renal ROS     Musculoskeletal negative musculoskeletal ROS (+)   Abdominal   Peds  Hematology negative hematology ROS (+)   Anesthesia Other Findings   Reproductive/Obstetrics                          Anesthesia Physical Anesthesia Plan  ASA: II  Anesthesia Plan: General   Post-op Pain Management:    Induction: Intravenous  Airway Management Planned:   Additional Equipment:   Intra-op Plan:   Post-operative Plan: Extubation in OR  Informed Consent: I have reviewed the patients History and Physical, chart, labs and discussed the procedure including the risks, benefits and alternatives for the proposed anesthesia with the patient or authorized representative who has indicated his/her understanding and acceptance.   Dental advisory given  Plan Discussed with: CRNA  Anesthesia Plan Comments:         Anesthesia Quick Evaluation

## 2012-05-23 NOTE — H&P (Signed)
  PREOPERATIVE H&P  Chief Complaint: DJD RIGHT HIP  HPI: Frederick Hall is a 72 y.o. male who presents for preoperative history and physical with a diagnosis of DJD RIGHT HIP. Symptoms are rated as moderate to severe, and have been worsening.  This is significantly impairing activities of daily living.  He has elected for surgical management.   Past Medical History  Diagnosis Date  . Hypertension   . Asthma 2005    "attack x 1"  . GERD (gastroesophageal reflux disease)   . Arthritis    Past Surgical History  Procedure Laterality Date  . Knee arthroscopy Left   . Shoulder arthroscopy Right   . Lipoma excision      multiple   History   Social History  . Marital Status: Married    Spouse Name: N/A    Number of Children: N/A  . Years of Education: N/A   Social History Main Topics  . Smoking status: Former Smoker -- 15 years    Quit date: 05/18/1972  . Smokeless tobacco: Never Used  . Alcohol Use: Yes     Comment: 3  wine or beer  . Drug Use: No  . Sexually Active: None   Other Topics Concern  . None   Social History Narrative  . None   History reviewed. No pertinent family history. Allergies  Allergen Reactions  . Penicillins Anaphylaxis    Childhood allergy    Prior to Admission medications   Medication Sig Start Date End Date Taking? Authorizing Provider  calcium carbonate (OS-CAL) 600 MG TABS Take 600 mg by mouth daily.   Yes Historical Provider, MD  lisinopril (PRINIVIL,ZESTRIL) 20 MG tablet Take 20 mg by mouth every morning.   Yes Historical Provider, MD  Multiple Vitamin (MULTIVITAMIN WITH MINERALS) TABS Take 1 tablet by mouth daily.   Yes Historical Provider, MD  naproxen sodium (ANAPROX) 220 MG tablet Take 220 mg by mouth 2 (two) times daily as needed (for pain). States will stop 48 hours pre op   Yes Historical Provider, MD  ranitidine (ZANTAC) 150 MG tablet Take 150 mg by mouth every morning.   Yes Historical Provider, MD     Positive ROS: All  other systems have been reviewed and were otherwise negative with the exception of those mentioned in the HPI and as above.  Physical Exam: General: Alert, no acute distress Cardiovascular: No pedal edema Respiratory: No cyanosis, no use of accessory musculature GI: No organomegaly, abdomen is soft and non-tender Skin: No lesions in the area of chief complaint Neurologic: Sensation intact distally Psychiatric: Patient is competent for consent with normal mood and affect Lymphatic: No axillary or cervical lymphadenopathy  MUSCULOSKELETAL: right hip with pain and loss of motion, 0-80 with no ER.  EHL firing.  Assessment: DJD RIGHT HIP  Plan: Plan for Procedure(s): TOTAL HIP ARTHROPLASTY  The risks benefits and alternatives were discussed with the patient including but not limited to the risks of nonoperative treatment, versus surgical intervention including infection, bleeding, nerve injury,  blood clots, cardiopulmonary complications, morbidity, mortality, among others, and they were willing to proceed.   Eulas Post, MD Cell [phone]   05/23/2012 7:33 AM

## 2012-05-23 NOTE — Anesthesia Postprocedure Evaluation (Signed)
Anesthesia Post Note  Patient: Frederick Hall  Procedure(s) Performed: Procedure(s) (LRB): TOTAL HIP ARTHROPLASTY (Right)  Anesthesia type: General  Patient location: PACU  Post pain: Pain level controlled  Post assessment: Post-op Vital signs reviewed  Last Vitals: BP 103/67  Pulse 62  Temp(Src) 36.5 C (Oral)  Resp 16  Ht 5\' 9"  (1.753 m)  Wt 184 lb (83.462 kg)  BMI 27.16 kg/m2  SpO2 95%  Post vital signs: Reviewed  Level of consciousness: sedated  Complications: No apparent anesthesia complications

## 2012-05-23 NOTE — Transfer of Care (Signed)
Immediate Anesthesia Transfer of Care Note  Patient: Frederick Hall  Procedure(s) Performed: Procedure(s) (LRB): TOTAL HIP ARTHROPLASTY (Right)  Patient Location: PACU  Anesthesia Type: General  Level of Consciousness: sedated, patient cooperative and responds to stimulaton  Airway & Oxygen Therapy: Patient Spontanous Breathing and Patient connected to face mask oxgen  Post-op Assessment: Report given to PACU RN and Post -op Vital signs reviewed and stable  Post vital signs: Reviewed and stable  Complications: No apparent anesthesia complications

## 2012-05-23 NOTE — Progress Notes (Signed)
Received orders for rw and commode.  Will be delivered to patients room prior to d/c. °

## 2012-05-23 NOTE — Progress Notes (Signed)
Utilization review completed.  

## 2012-05-24 ENCOUNTER — Encounter (HOSPITAL_COMMUNITY): Payer: Self-pay | Admitting: Orthopedic Surgery

## 2012-05-24 LAB — CBC
MCH: 31.3 pg (ref 26.0–34.0)
MCV: 94 fL (ref 78.0–100.0)
Platelets: 193 10*3/uL (ref 150–400)
RBC: 3.67 MIL/uL — ABNORMAL LOW (ref 4.22–5.81)

## 2012-05-24 LAB — BASIC METABOLIC PANEL
CO2: 24 mEq/L (ref 19–32)
Calcium: 8.4 mg/dL (ref 8.4–10.5)
Creatinine, Ser: 0.78 mg/dL (ref 0.50–1.35)
Glucose, Bld: 129 mg/dL — ABNORMAL HIGH (ref 70–99)

## 2012-05-24 LAB — PROTIME-INR: Prothrombin Time: 13.5 seconds (ref 11.6–15.2)

## 2012-05-24 MED ORDER — RIVAROXABAN 10 MG PO TABS: 10.0000 mg | ORAL_TABLET | Freq: Every day | ORAL | Status: DC

## 2012-05-24 MED ORDER — RIVAROXABAN 10 MG PO TABS
10.0000 mg | ORAL_TABLET | Freq: Every day | ORAL | Status: DC
  Administered 2012-05-24 – 2012-05-25 (×2): 10 mg via ORAL
  Filled 2012-05-24 (×2): qty 1

## 2012-05-24 NOTE — Progress Notes (Signed)
Physical Therapy Treatment Note   05/24/12 1600  PT Visit Information  Last PT Received On 05/24/12  Assistance Needed +1  PT Time Calculation  PT Start Time 1441  PT Stop Time 1456  PT Time Calculation (min) 15 min  Subjective Data  Subjective Oh yeah thats much better.  (crutch and rail vs just rail)  Precautions  Precautions Posterior Hip  Precaution Comments pt able to verbalize all hip precautions  Restrictions  RLE Weight Bearing WBAT  Cognition  Arousal/Alertness Awake/alert  Behavior During Therapy WFL for tasks assessed/performed  Overall Cognitive Status Within Functional Limits for tasks assessed  Bed Mobility  Bed Mobility Not assessed  Transfers  Transfers Sit to Stand;Stand to Sit  Sit to Stand 5: Supervision;With armrests;From chair/3-in-1  Stand to Sit With upper extremity assist;To chair/3-in-1;5: Supervision  Ambulation/Gait  Ambulation/Gait Assistance 5: Supervision  Ambulation Distance (Feet) 200 Feet  Assistive device Rolling walker  Ambulation/Gait Assistance Details verbal cues for RW distance  Gait Pattern Step-through pattern;Antalgic  Stairs Yes  Stairs Assistance 4: Min assist;4: Min guard  Stairs Assistance Details (indicate cue type and reason) pt attempted with only rail with min assist and given crutch, verbal cues for safety and technique, sequence  Stair Management Technique One rail Right;Step to pattern;Forwards;With crutches  Number of Stairs 4  PT - End of Session  Activity Tolerance Patient tolerated treatment well  Patient left in chair;with call bell/phone within reach  Nurse Communication Patient requests pain meds  PT - Assessment/Plan  Comments on Treatment Session Pt ambulated in hallway and performed stairs with crutch and rail.  Pt progressing very well with mobility and plans to d/c home tomorrow.  Ice packs applied.  PT Plan Discharge plan remains appropriate;Frequency remains appropriate  Follow Up Recommendations Home  health PT  PT equipment None recommended by PT  Acute Rehab PT Goals  PT Goal: Sit to Stand - Progress Progressing toward goal  PT Goal: Stand to Sit - Progress Progressing toward goal  PT Goal: Ambulate - Progress Progressing toward goal  PT Goal: Up/Down Stairs - Progress Progressing toward goal  PT General Charges  $$ ACUTE PT VISIT 1 Procedure  PT Treatments  $Gait Training 8-22 mins   Zenovia Jarred, PT, DPT 05/24/2012 Pager: [phone]

## 2012-05-24 NOTE — Progress Notes (Signed)
Physical Therapy Treatment Patient Details Name: Frederick Hall MRN: [id number] DOB: 02/17/1940 Today's Date: 05/24/2012 Time: 7829-5621 PT Time Calculation (min): 24 min  PT Assessment / Plan / Recommendation Comments on Treatment Session  Pt ambulated in hallway and performed exercises.  Pt progressing well with mobility.  Pt agreeable to attempt stairs this afternoon.    Follow Up Recommendations  Home health PT     Does the patient have the potential to tolerate intense rehabilitation     Barriers to Discharge        Equipment Recommendations  None recommended by PT (BSC and RW brought to room)    Recommendations for Other Services    Frequency     Plan Discharge plan remains appropriate;Frequency remains appropriate    Precautions / Restrictions Precautions Precautions: Posterior Hip Precaution Comments: pt able to verbalize all hip precautions Restrictions Weight Bearing Restrictions: No RLE Weight Bearing: Weight bearing as tolerated   Pertinent Vitals/Pain C/o soreness during ambulation, ice pack applied, premedicated    Mobility  Bed Mobility Bed Mobility: Supine to Sit Supine to Sit: HOB elevated;5: Supervision Details for Bed Mobility Assistance: verbal cues of technique and precautions Transfers Transfers: Sit to Stand;Stand to Sit Sit to Stand: With upper extremity assist;From bed;4: Min guard Stand to Sit: 4: Min guard;With upper extremity assist;To chair/3-in-1 Details for Transfer Assistance: verbal cues for safe technique Ambulation/Gait Ambulation/Gait Assistance: 4: Min guard;5: Supervision Ambulation Distance (Feet): 140 Feet Assistive device: Rolling walker Ambulation/Gait Assistance Details: verbal cues for step length and posture also using upper body through RW  Gait Pattern: Step-to pattern;Antalgic Gait velocity: decreased    Exercises Total Joint Exercises Ankle Circles/Pumps: AROM;20 reps;Both Quad Sets: AROM;Both;20 reps Gluteal  Sets: AROM;Both;20 reps Heel Slides: AAROM;Right;10 reps;Limitations Heel Slides Limitations: within precautions Hip ABduction/ADduction: AAROM;Right;10 reps;Limitations Hip Abduction/Adduction Limitations: within precautions Straight Leg Raises: AAROM;10 reps;Limitations;Right Straight Leg Raises Limitations: within precautions Long Arc Quad: AROM;Right;15 reps;Seated   PT Diagnosis:    PT Problem List:   PT Treatment Interventions:     PT Goals Acute Rehab PT Goals PT Goal: Supine/Side to Sit - Progress: Progressing toward goal PT Goal: Sit to Stand - Progress: Progressing toward goal PT Goal: Stand to Sit - Progress: Progressing toward goal PT Goal: Ambulate - Progress: Progressing toward goal PT Goal: Perform Home Exercise Program - Progress: Progressing toward goal  Visit Information  Last PT Received On: 05/24/12 Assistance Needed: +1    Subjective Data  Subjective: I can do isometric exercises right? That won't hurt anything?   Cognition  Cognition Arousal/Alertness: Awake/alert Behavior During Therapy: WFL for tasks assessed/performed Overall Cognitive Status: Within Functional Limits for tasks assessed    Balance     End of Session PT - End of Session Activity Tolerance: Patient tolerated treatment well Patient left: in chair;with call bell/phone within reach   GP     Frederick Hall,Frederick Hall 05/24/2012, 10:53 AM Zenovia Jarred, PT, DPT 05/24/2012 Pager: [phone]

## 2012-05-24 NOTE — Evaluation (Signed)
Occupational Therapy Evaluation Patient Details Name: Frederick Hall MRN: [id number] DOB: 03/28/1940 Today's Date: 05/24/2012 Time: 4098-1191 OT Time Calculation (min): 19 min  OT Assessment / Plan / Recommendation Clinical Impression  This 72 year old man was admitted for R posterior approach THA.  All education was completed and pt verbalizes understanding of adls and bathroom transfers with precautions.  No further OT is needed.    OT Assessment  Patient does not need any further OT services    Follow Up Recommendations  No OT follow up    Barriers to Discharge      Equipment Recommendations  3 in 1 bedside comode (delivered)    Recommendations for Other Services    Frequency       Precautions / Restrictions Precautions Precautions: Posterior Hip Precaution Comments: pt able to verbalize all hip precautions Restrictions RLE Weight Bearing: Weight bearing as tolerated   Pertinent Vitals/Pain No c/o pain    ADL  Grooming: Supervision/safety;Wash/dry hands;Teeth care Where Assessed - Grooming: Supported standing Toilet Transfer: Buyer, retail Method: Sit to Barista:  (back to recliner) Equipment Used: Rolling walker Transfers/Ambulation Related to ADLs: ambulated to bathroom with supervision.  Pt did not need any cues for thps:  verbalizes all.  Educated on tub transfer:  he may borrow a seat or bench from his church.  He plans to sponge bathe initially ADL Comments: Pt has a reacher at home and long sponge.  He verbalizes understanding of using for pants but did not feel he needed to practice this.  He was able to stand and use urinal with supervision.  No cues needed during session.    OT Diagnosis:    OT Problem List:   OT Treatment Interventions:     OT Goals    Visit Information  Last OT Received On: 05/24/12 Assistance Needed: +1    Subjective Data  Subjective: I'll just have my wife put my socks  on Patient Stated Goal: Get back to playing golf   Prior Functioning     Home Living Lives With: Spouse Bathroom Shower/Tub: Engineer, manufacturing systems: Standard Additional Comments: 3:1 was delivered to room Prior Function Level of Independence: Independent Communication Communication: No difficulties         Vision/Perception     Cognition  Cognition Arousal/Alertness: Awake/alert Behavior During Therapy: WFL for tasks assessed/performed Overall Cognitive Status: Within Functional Limits for tasks assessed    Extremity/Trunk Assessment Right Upper Extremity Assessment RUE ROM/Strength/Tone: Within functional levels Left Upper Extremity Assessment LUE ROM/Strength/Tone: Within functional levels     Mobility  Transfers Sit to Stand: 5: Supervision;With armrests;From chair/3-in-1 Stand to Sit: 4: Min guard;With upper extremity assist;To chair/3-in-1 Details for Transfer Assistance:  (no cues needed today)     Exercise    Balance     End of Session OT - End of Session Activity Tolerance: Patient tolerated treatment well Patient left: in chair;with call bell/phone within reach  GO     [facility] 05/24/2012, 11:49 AM Marica Otter, OTR/L [phone] 05/24/2012

## 2012-05-24 NOTE — Progress Notes (Signed)
CSW consulted for SNF placement. PN reviewed. PT is recommending HHPT following hospital d/c. RNCM will assist with d/c planning. CSW is available to assist if plan changes and SNF placement is needed.  Kolbey Teichert LCSW [phone] 

## 2012-05-24 NOTE — Progress Notes (Signed)
Patient ID: Frederick Hall, male   DOB: 17-Aug-1940, 72 y.o.   MRN: [id number]     Subjective:  Patient reports pain as mild.  Patient denies any CP or SOB and is sitting up in bed having coffee.  States that he would like to go home tomorrow.  Objective:   VITALS:   Filed Vitals:   05/24/12 0000 05/24/12 0200 05/24/12 0400 05/24/12 0531  BP:  107/71  99/62  Pulse:  76  64  Temp:  98.6 F (37 C)  98.7 F (37.1 C)  TempSrc:      Resp: 18 18 16 16   Height:      Weight:      SpO2: 96% 96% 95% 98%    ABD soft Sensation intact distally Dorsiflexion/Plantar flexion intact Incision: dressing C/D/I and scant drainage   Lab Results  Component Value Date   WBC 9.8 05/24/2012   HGB 11.5* 05/24/2012   HCT 34.5* 05/24/2012   MCV 94.0 05/24/2012   PLT 193 05/24/2012     Assessment/Plan: 1 Day Post-Op   Principal Problem:   Osteoarthritis of right hip   Advance diet Up with therapy Plan for DC tomorrow if passes PT Plan for Dressing change tomorrow DC coumadin and put on xerelto per patient request.   Haskel Khan 05/24/2012, 7:36 AM   Teryl Lucy, MD Cell [phone]

## 2012-05-25 LAB — BASIC METABOLIC PANEL
BUN: 11 mg/dL (ref 6–23)
CO2: 29 mEq/L (ref 19–32)
Chloride: 99 mEq/L (ref 96–112)
Creatinine, Ser: 0.94 mg/dL (ref 0.50–1.35)
GFR calc Af Amer: >90 mL/min (ref >90)
Glucose, Bld: 122 mg/dL — ABNORMAL HIGH (ref 70–99)
Potassium: 3.9 mEq/L (ref 3.5–5.1)

## 2012-05-25 LAB — CBC
HCT: 34.3 % — ABNORMAL LOW (ref 39.0–52.0)
Hemoglobin: 10.7 g/dL — ABNORMAL LOW (ref 13.0–17.0)
MCV: 94.8 fL (ref 78.0–100.0)
RBC: 3.62 MIL/uL — ABNORMAL LOW (ref 4.22–5.81)
RDW: 15.3 % (ref 11.5–15.5)
WBC: 9.4 10*3/uL (ref 4.0–10.5)

## 2012-05-25 NOTE — Discharge Summary (Signed)
Physician Discharge Summary  Patient ID: Frederick Hall MRN: [id number] DOB/AGE: [birth year] 72 y.o.  Admit date: 05/23/2012 Discharge date: 05/25/2012  Admission Diagnoses:  Osteoarthritis of right hip  Discharge Diagnoses:  Principal Problem:   Osteoarthritis of right hip   Past Medical History  Diagnosis Date  . Hypertension   . Asthma 2005    "attack x 1"  . GERD (gastroesophageal reflux disease)   . Arthritis   . Osteoarthritis of right hip 05/23/2012    Surgeries: Procedure(s): TOTAL HIP ARTHROPLASTY on 05/23/2012   Consultants (if any):    Discharged Condition: Improved  Hospital Course: Frederick Hall is an 72 y.o. male who was admitted 05/23/2012 with a diagnosis of Osteoarthritis of right hip and went to the operating room on 05/23/2012 and underwent the above named procedures.    He was given perioperative antibiotics:  Anti-infectives   Start     Dose/Rate Route Frequency Ordered Stop   05/23/12 1400  ceFAZolin (ANCEF) IVPB 2 g/50 mL premix     2 g 100 mL/hr over 30 Minutes Intravenous Every 6 hours 05/23/12 1136 05/23/12 1941   05/23/12 0528  ceFAZolin (ANCEF) IVPB 2 g/50 mL premix  Status:  Discontinued     2 g 100 mL/hr over 30 Minutes Intravenous On call to O.R. 05/23/12 4098 05/23/12 0745    .  He was given sequential compression devices, early ambulation, and xarelto for DVT prophylaxis.  He benefited maximally from the hospital stay and there were no complications.    Recent vital signs:  Filed Vitals:   05/25/12 0724  BP: 103/67  Pulse:   Temp:   Resp:     Recent laboratory studies:  Lab Results  Component Value Date   HGB 10.7* 05/25/2012   HGB 11.5* 05/24/2012   HGB 13.0 05/23/2012   Lab Results  Component Value Date   WBC 9.4 05/25/2012   PLT 171 05/25/2012   Lab Results  Component Value Date   INR 1.02 05/25/2012   Lab Results  Component Value Date   NA 134* 05/25/2012   K 3.9 05/25/2012   CL 99 05/25/2012   CO2 29  05/25/2012   BUN 11 05/25/2012   CREATININE 0.94 05/25/2012   GLUCOSE 122* 05/25/2012    Discharge Medications:     Medication List    STOP taking these medications       naproxen sodium 220 MG tablet  Commonly known as:  ANAPROX      TAKE these medications       bisacodyl 5 MG EC tablet  Commonly known as:  DULCOLAX  Take 1 tablet (5 mg total) by mouth daily as needed for constipation.     calcium carbonate 600 MG Tabs  Commonly known as:  OS-CAL  Take 600 mg by mouth daily.     lisinopril 20 MG tablet  Commonly known as:  PRINIVIL,ZESTRIL  Take 20 mg by mouth every morning.     multivitamin with minerals Tabs  Take 1 tablet by mouth daily.     oxyCODONE-acetaminophen 10-325 MG per tablet  Commonly known as:  PERCOCET  Take 1-2 tablets by mouth every 6 (six) hours as needed for pain. MAXIMUM TOTAL ACETAMINOPHEN DOSE IS 4000 MG PER DAY     ranitidine 150 MG tablet  Commonly known as:  ZANTAC  Take 150 mg by mouth every morning.     rivaroxaban 10 MG Tabs tablet  Commonly known as:  XARELTO  Take 1 tablet (  10 mg total) by mouth daily.     sennosides-docusate sodium 8.6-50 MG tablet  Commonly known as:  SENOKOT-S  Take 1 tablet by mouth daily.        Diagnostic Studies: Dg Chest 2 View  05/18/2012   *RADIOLOGY REPORT*  Clinical Data: Preop hip replacement  CHEST - 2 VIEW  Comparison: 07/10/2007  Findings: Heart size is upper normal.  Negative for heart failure. Lungs remain clear without infiltrate effusion or mass.  No change from the prior study.  IMPRESSION: No active cardiopulmonary abnormality.   Original Report Authenticated By: Janeece Riggers, M.D.   Dg Pelvis Portable  05/23/2012   *RADIOLOGY REPORT*  Clinical Data: Right hip replacement.  PORTABLE PELVIS  Comparison: None.  Findings: The patient has a new right total hip arthroplasty.  The device is located and there is no fracture.  Gas in the soft tissues from surgery is noted.  Severe degenerative disease  about the left hip is seen.  IMPRESSION: Right total hip replacement without evidence of complication.  No acute abnormality.   Original Report Authenticated By: Holley Dexter, M.D.   Dg Hip Portable 1 View Right  05/23/2012   *RADIOLOGY REPORT*  Clinical Data: Status post right hip replacement.  PORTABLE RIGHT HIP - 1 VIEW  Comparison: None.  Findings: Right total hip arthroplasty is in place.  The device is located and there is no fracture. Gas in the soft tissues from surgery is noted.  IMPRESSION: Right total hip replacement without evidence of complication.   Original Report Authenticated By: Holley Dexter, M.D.    Disposition: 01-Home or Self Care      Discharge Orders   Future Orders Complete By Expires     Call MD / Call 911  As directed     Comments:      If you experience chest pain or shortness of breath, CALL 911 and be transported to the hospital emergency room.  If you develope a fever above 101 F, pus (white drainage) or increased drainage or redness at the wound, or calf pain, call your surgeon's office.    Change dressing  As directed     Comments:      You may change your dressing in 3 days, then change the dressing daily with sterile 4 x 4 inch gauze dressing and paper tape.  You may clean the incision with alcohol prior to redressing    Constipation Prevention  As directed     Comments:      Drink plenty of fluids.  Prune juice may be helpful.  You may use a stool softener, such as Colace (over the counter) 100 mg twice a day.  Use MiraLax (over the counter) for constipation as needed.    Diet general  As directed     Discharge instructions  As directed     Comments:      Change dressing in 3 days and reapply fresh dressing, unless you have a splint (half cast).  If you have a splint/cast, just leave in place until your follow-up appointment.    Keep wounds dry for 3 weeks.  Leave steri-strips in place on skin.  Do not apply lotion or anything to the wound.     Follow the hip precautions as taught in Physical Therapy  As directed     Posterior total hip precautions  As directed     TED hose  As directed     Comments:      Use stockings (  TED hose) for 2 weeks on both leg(s).  You may remove them at night for sleeping.    Weight bearing as tolerated  As directed        Follow-up Information   Follow up with Eulas Post, MD. Schedule an appointment as soon as possible for a visit in 2 weeks.   Contact information:   [address] [place] [address] [phone]        Signed: Eulas Post 05/25/2012, 10:44 AM

## 2012-05-25 NOTE — Progress Notes (Signed)
Patient ID: Frederick Hall, male   DOB: [birth year], 72 y.o.   MRN: [id number]     Subjective:  Patient reports pain as mild.  Patient had more pain yesterday after PT He states that he still feels good.  Objective:   VITALS:   Filed Vitals:   05/24/12 2054 05/25/12 0000 05/25/12 0400 05/25/12 0500  BP: 121/77   101/66  Pulse: 64   69  Temp: 97.8 F (36.6 C)   98.2 F (36.8 C)  TempSrc: Oral   Oral  Resp: 16 18 16 16   Height:      Weight:      SpO2: 96% 98% 98% 96%    ABD soft Sensation intact distally Dorsiflexion/Plantar flexion intact Incision: dressing C/D/I and scant drainage Wound clean and dry no sign of infection   Lab Results  Component Value Date   WBC 9.4 05/25/2012   HGB 10.7* 05/25/2012   HCT 34.3* 05/25/2012   MCV 94.8 05/25/2012   PLT 171 05/25/2012     Assessment/Plan: 2 Days Post-Op   Principal Problem:   Osteoarthritis of right hip   Advance diet Up with therapy Discharge home with home health WBAT Dry dressing PRN Follow up with Jassmine Vandruff in Two weeks.   Haskel Khan 05/25/2012, 7:13 AM   Teryl Lucy, MD Cell [phone]

## 2012-05-25 NOTE — Progress Notes (Signed)
Physical Therapy Treatment Patient Details Name: Frederick Hall MRN: [id number] DOB: [birth year] Today's Date: 05/25/2012 Time: 0921-0949 PT Time Calculation (min): 28 min  PT Assessment / Plan / Recommendation Comments on Treatment Session  Pt ambulated in hallway and performed exercises.  Discussed exercise handout and also reviewed and provided hanout for car transfers.  Verbally reviewed stair technique as pt preferred not to practice again as he felt comfortable with performance yesterday.  Pt feels ready for d/c home today.  No further questions/concerns from pt or spouse.    Follow Up Recommendations  Home health PT     Does the patient have the potential to tolerate intense rehabilitation     Barriers to Discharge        Equipment Recommendations  None recommended by PT    Recommendations for Other Services    Frequency     Plan Discharge plan remains appropriate;Frequency remains appropriate    Precautions / Restrictions Precautions Precautions: Posterior Hip Precaution Comments: pt able to verbalize all hip precautions Restrictions RLE Weight Bearing: Weight bearing as tolerated   Pertinent Vitals/Pain Pt reports pain with activity but tolerable also reports premedicated.    Mobility  Bed Mobility Bed Mobility: Supine to Sit Supine to Sit: 4: Min assist Details for Bed Mobility Assistance: pt felt a little sore and asked for assist for R LE so had spouse assist Transfers Transfers: Sit to Stand;Stand to Sit Sit to Stand: 5: Supervision;With armrests;From bed Stand to Sit: With upper extremity assist;To chair/3-in-1;5: Supervision Ambulation/Gait Ambulation/Gait Assistance: 5: Supervision Ambulation Distance (Feet): 160 Feet Assistive device: Rolling walker Ambulation/Gait Assistance Details: doing well with RW, used UEs on RW to assist with pain control for R hip Gait Pattern: Step-through pattern;Antalgic Gait velocity: decreased    Exercises Total Joint  Exercises Ankle Circles/Pumps: AROM;20 reps;Both Quad Sets: AROM;Both;20 reps Gluteal Sets: AROM;Both;20 reps Heel Slides: Right;Limitations;20 reps;AAROM Heel Slides Limitations: within precautions Hip ABduction/ADduction: AAROM;Right;Limitations;20 reps Hip Abduction/Adduction Limitations: within precautions Straight Leg Raises: AAROM;10 reps;Limitations;Right Straight Leg Raises Limitations: within precautions Long Arc Quad: AROM;Right;Seated;20 reps   PT Diagnosis:    PT Problem List:   PT Treatment Interventions:     PT Goals Acute Rehab PT Goals PT Goal: Supine/Side to Sit - Progress: Progressing toward goal PT Goal: Sit to Stand - Progress: Progressing toward goal PT Goal: Stand to Sit - Progress: Progressing toward goal PT Goal: Ambulate - Progress: Progressing toward goal PT Goal: Perform Home Exercise Program - Progress: Progressing toward goal  Visit Information  Last PT Received On: 05/25/12 Assistance Needed: +1    Subjective Data  Subjective: He said no golf for a year.   Cognition  Cognition Arousal/Alertness: Awake/alert Behavior During Therapy: WFL for tasks assessed/performed Overall Cognitive Status: Within Functional Limits for tasks assessed    Balance     End of Session PT - End of Session Activity Tolerance: Patient tolerated treatment well Patient left: in chair;with call bell/phone within reach;with family/visitor present   GP     Fleta Borgeson,KATHrine E 05/25/2012, 12:11 PM Zenovia Jarred, PT, DPT 05/25/2012 Pager: [phone]

## 2012-07-24 ENCOUNTER — Encounter (HOSPITAL_COMMUNITY): Payer: Self-pay | Admitting: Pharmacy Technician

## 2012-07-27 NOTE — Pre-Procedure Instructions (Signed)
Frederick Hall  07/27/2012   Your procedure is scheduled on:   Monday  08/07/12   Report to [facility] at 1100 AM.  Call this number if you have problems the morning of surgery: [phone]   Remember:   Do not eat food or drink liquids after midnight.   Take these medicines the morning of surgery with A SIP OF WATER: zantac   Do not wear jewelry, make-up or nail polish.  Do not wear lotions, powders, or perfumes. You may wear deodorant.  Do not shave 48 hours prior to surgery. Men may shave face and neck.  Do not bring valuables to the hospital.  [facility] is not responsible                   for any belongings or valuables.  Contacts, dentures or bridgework may not be worn into surgery.  Leave suitcase in the car. After surgery it may be brought to your room.  For patients admitted to the hospital, checkout time is 11:00 AM the day of  discharge.   Patients discharged the day of surgery will not be allowed to drive  home.  Name and phone number of your driver:   Special Instructions: Shower using CHG 2 nights before surgery and the night before surgery.  If you shower the day of surgery use CHG.  Use special wash - you have one bottle of CHG for all showers.  You should use approximately 1/3 of the bottle for each shower.   Please read over the following fact sheets that you were given: Pain Booklet, Coughing and Deep Breathing, Blood Transfusion Information, Total Joint Packet, MRSA Information and Surgical Site Infection Prevention

## 2012-07-28 ENCOUNTER — Encounter (HOSPITAL_COMMUNITY): Payer: Self-pay

## 2012-07-28 ENCOUNTER — Encounter (HOSPITAL_COMMUNITY)
Admission: RE | Admit: 2012-07-28 | Discharge: 2012-07-28 | Disposition: A | Discharge status: Home / Self Care | Payer: Medicare Other | Source: Ambulatory Visit | Attending: Orthopedic Surgery | Admitting: Orthopedic Surgery

## 2012-07-28 DIAGNOSIS — Z01812 Encounter for preprocedural laboratory examination: Secondary | ICD-10-CM | POA: Insufficient documentation

## 2012-07-28 DIAGNOSIS — Z01818 Encounter for other preprocedural examination: Secondary | ICD-10-CM | POA: Insufficient documentation

## 2012-07-28 LAB — BASIC METABOLIC PANEL
CO2: 26 mEq/L (ref 19–32)
Chloride: 103 mEq/L (ref 96–112)
Creatinine, Ser: 0.85 mg/dL (ref 0.50–1.35)
GFR calc Af Amer: >90 mL/min (ref >90)
Sodium: 138 mEq/L (ref 135–145)

## 2012-07-28 LAB — CBC
HCT: 43.7 % (ref 39.0–52.0)
Platelets: 174 10*3/uL (ref 150–400)
RBC: 4.64 MIL/uL (ref 4.22–5.81)
RDW: 15 % (ref 11.5–15.5)
WBC: 10.7 10*3/uL — ABNORMAL HIGH (ref 4.0–10.5)

## 2012-07-28 LAB — SURGICAL PCR SCREEN
MRSA, PCR: NEGATIVE
Staphylococcus aureus: NEGATIVE

## 2012-07-28 LAB — TYPE AND SCREEN: Antibody Screen: NEGATIVE

## 2012-07-28 LAB — ABO/RH: ABO/RH(D): B POS

## 2012-07-28 NOTE — Pre-Procedure Instructions (Signed)
Frederick Hall  07/28/2012   Your procedure is scheduled on:   Monday  08/07/12   Report to [facility] at 1100 AM.  Call this number if you have problems the morning of surgery: [phone]   Remember:   Do not eat food or drink liquids after midnight.   Take these medicines the morning of surgery with A SIP OF WATER: zantac   Do not wear jewelry, make-up or nail polish.  Do not wear lotions, powders, or perfumes. You may wear deodorant.  Do not shave 48 hours prior to surgery. Men may shave face and neck.  Do not bring valuables to the hospital.  [facility] is not responsible                   for any belongings or valuables.  Contacts, dentures or bridgework may not be worn into surgery.  Leave suitcase in the car. After surgery it may be brought to your room.  For patients admitted to the hospital, checkout time is 11:00 AM the day of  discharge.   Patients discharged the day of surgery will not be allowed to drive  home.  Name and phone number of your driver:   Special Instructions: Shower using CHG 2 nights before surgery and the night before surgery.  If you shower the day of surgery use CHG.  Use special wash - you have one bottle of CHG for all showers.  You should use approximately 1/3 of the bottle for each shower.   Please read over the following fact sheets that you were given: Pain Booklet, Coughing and Deep Breathing, Blood Transfusion Information, Total Joint Packet, MRSA Information and Surgical Site Infection Prevention

## 2012-08-02 ENCOUNTER — Other Ambulatory Visit: Payer: Self-pay | Admitting: Orthopedic Surgery

## 2012-08-03 ENCOUNTER — Other Ambulatory Visit (HOSPITAL_COMMUNITY): Payer: Medicare Other

## 2012-08-06 MED ORDER — VANCOMYCIN HCL IN DEXTROSE 1-5 GM/200ML-% IV SOLN
1000.0000 mg | INTRAVENOUS | Status: AC
  Administered 2012-08-07: 1000 mg via INTRAVENOUS
  Filled 2012-08-06: qty 200

## 2012-08-07 ENCOUNTER — Inpatient Hospital Stay (HOSPITAL_COMMUNITY)
Admission: RE | Admit: 2012-08-07 | Discharge: 2012-08-11 | DRG: 470 | Disposition: A | Discharge status: Home Health | Payer: Medicare Other | Source: Ambulatory Visit | Attending: Orthopedic Surgery | Admitting: Orthopedic Surgery

## 2012-08-07 ENCOUNTER — Encounter (HOSPITAL_COMMUNITY): Payer: Self-pay | Admitting: *Deleted

## 2012-08-07 ENCOUNTER — Encounter (HOSPITAL_COMMUNITY)
Admission: RE | Disposition: A | Discharge status: Home Health | Payer: Self-pay | Source: Ambulatory Visit | Attending: Orthopedic Surgery

## 2012-08-07 ENCOUNTER — Inpatient Hospital Stay (HOSPITAL_COMMUNITY): Payer: Medicare Other

## 2012-08-07 ENCOUNTER — Inpatient Hospital Stay (HOSPITAL_COMMUNITY): Payer: Medicare Other | Admitting: Anesthesiology

## 2012-08-07 ENCOUNTER — Encounter (HOSPITAL_COMMUNITY): Payer: Self-pay | Admitting: Vascular Surgery

## 2012-08-07 DIAGNOSIS — Z87891 Personal history of nicotine dependence: Secondary | ICD-10-CM

## 2012-08-07 DIAGNOSIS — I10 Essential (primary) hypertension: Secondary | ICD-10-CM | POA: Diagnosis present

## 2012-08-07 DIAGNOSIS — M1610 Unilateral primary osteoarthritis, unspecified hip: Principal | ICD-10-CM | POA: Diagnosis present

## 2012-08-07 DIAGNOSIS — Z7901 Long term (current) use of anticoagulants: Secondary | ICD-10-CM

## 2012-08-07 DIAGNOSIS — K219 Gastro-esophageal reflux disease without esophagitis: Secondary | ICD-10-CM | POA: Diagnosis present

## 2012-08-07 DIAGNOSIS — J45909 Unspecified asthma, uncomplicated: Secondary | ICD-10-CM | POA: Diagnosis present

## 2012-08-07 DIAGNOSIS — M1612 Unilateral primary osteoarthritis, left hip: Secondary | ICD-10-CM

## 2012-08-07 DIAGNOSIS — Z79899 Other long term (current) drug therapy: Secondary | ICD-10-CM

## 2012-08-07 DIAGNOSIS — Z880 Allergy status to penicillin: Secondary | ICD-10-CM

## 2012-08-07 DIAGNOSIS — Z96649 Presence of unspecified artificial hip joint: Secondary | ICD-10-CM

## 2012-08-07 DIAGNOSIS — M169 Osteoarthritis of hip, unspecified: Principal | ICD-10-CM | POA: Diagnosis present

## 2012-08-07 HISTORY — DX: Unilateral primary osteoarthritis, left hip: M16.12

## 2012-08-07 HISTORY — PX: TOTAL HIP ARTHROPLASTY: SHX124

## 2012-08-07 LAB — APTT: aPTT: 29 seconds (ref 24–37)

## 2012-08-07 LAB — PROTIME-INR: INR: 0.93 (ref 0.00–1.49)

## 2012-08-07 SURGERY — ARTHROPLASTY, HIP, TOTAL,POSTERIOR APPROACH
Anesthesia: General | Site: Hip | Laterality: Left | Wound class: Clean

## 2012-08-07 MED ORDER — LISINOPRIL 20 MG PO TABS
20.0000 mg | ORAL_TABLET | Freq: Every morning | ORAL | Status: DC
  Administered 2012-08-08 – 2012-08-11 (×4): 20 mg via ORAL
  Filled 2012-08-07 (×4): qty 1

## 2012-08-07 MED ORDER — METOCLOPRAMIDE HCL 10 MG PO TABS: 5.0000 mg | ORAL_TABLET | Freq: Three times a day (TID) | ORAL | Status: DC | PRN

## 2012-08-07 MED ORDER — ARTIFICIAL TEARS OP OINT
TOPICAL_OINTMENT | OPHTHALMIC | Status: DC | PRN
  Administered 2012-08-07: 1 via OPHTHALMIC

## 2012-08-07 MED ORDER — BUPIVACAINE HCL (PF) 0.25 % IJ SOLN
INTRAMUSCULAR | Status: AC
  Filled 2012-08-07: qty 30

## 2012-08-07 MED ORDER — METHOCARBAMOL 100 MG/ML IJ SOLN
500.0000 mg | Freq: Four times a day (QID) | INTRAVENOUS | Status: DC | PRN
  Filled 2012-08-07: qty 5

## 2012-08-07 MED ORDER — OXYCODONE-ACETAMINOPHEN 10-325 MG PO TABS: 1.0000 | ORAL_TABLET | Freq: Four times a day (QID) | ORAL | Status: DC | PRN

## 2012-08-07 MED ORDER — OXYCODONE HCL 5 MG PO TABS
ORAL_TABLET | ORAL | Status: AC
  Filled 2012-08-07: qty 1

## 2012-08-07 MED ORDER — GLYCOPYRROLATE 0.2 MG/ML IJ SOLN
INTRAMUSCULAR | Status: DC | PRN
  Administered 2012-08-07 (×2): 0.4 mg via INTRAVENOUS

## 2012-08-07 MED ORDER — ACETAMINOPHEN 650 MG RE SUPP: 650.0000 mg | Freq: Four times a day (QID) | RECTAL | Status: DC | PRN

## 2012-08-07 MED ORDER — POTASSIUM CHLORIDE IN NACL 20-0.45 MEQ/L-% IV SOLN
INTRAVENOUS | Status: DC
  Administered 2012-08-07 – 2012-08-08 (×2): via INTRAVENOUS
  Filled 2012-08-07 (×8): qty 1000

## 2012-08-07 MED ORDER — ONDANSETRON HCL 4 MG/2ML IJ SOLN
4.0000 mg | Freq: Four times a day (QID) | INTRAMUSCULAR | Status: DC | PRN
  Administered 2012-08-08: 4 mg via INTRAVENOUS
  Filled 2012-08-07: qty 2

## 2012-08-07 MED ORDER — RIVAROXABAN 10 MG PO TABS
10.0000 mg | ORAL_TABLET | Freq: Every day | ORAL | Status: DC
  Administered 2012-08-08 – 2012-08-11 (×4): 10 mg via ORAL
  Filled 2012-08-07 (×5): qty 1

## 2012-08-07 MED ORDER — BISACODYL 10 MG RE SUPP: 10.0000 mg | Freq: Every day | RECTAL | Status: DC | PRN

## 2012-08-07 MED ORDER — HYDROMORPHONE HCL PF 1 MG/ML IJ SOLN
INTRAMUSCULAR | Status: AC
  Administered 2012-08-07: 0.5 mg via INTRAVENOUS
  Filled 2012-08-07: qty 1

## 2012-08-07 MED ORDER — ACETAMINOPHEN 325 MG PO TABS
650.0000 mg | ORAL_TABLET | Freq: Four times a day (QID) | ORAL | Status: DC | PRN
  Administered 2012-08-07: 650 mg via ORAL
  Filled 2012-08-07: qty 2

## 2012-08-07 MED ORDER — MENTHOL 3 MG MT LOZG: 1.0000 | LOZENGE | OROMUCOSAL | Status: DC | PRN

## 2012-08-07 MED ORDER — VANCOMYCIN HCL IN DEXTROSE 1-5 GM/200ML-% IV SOLN
1000.0000 mg | Freq: Two times a day (BID) | INTRAVENOUS | Status: AC
  Administered 2012-08-07: 1000 mg via INTRAVENOUS
  Filled 2012-08-07: qty 200

## 2012-08-07 MED ORDER — METHOCARBAMOL 500 MG PO TABS: 500.0000 mg | ORAL_TABLET | Freq: Four times a day (QID) | ORAL | Status: DC

## 2012-08-07 MED ORDER — DOCUSATE SODIUM 100 MG PO CAPS
100.0000 mg | ORAL_CAPSULE | Freq: Two times a day (BID) | ORAL | Status: DC
  Administered 2012-08-07 – 2012-08-11 (×8): 100 mg via ORAL
  Filled 2012-08-07 (×8): qty 1

## 2012-08-07 MED ORDER — METOCLOPRAMIDE HCL 5 MG/ML IJ SOLN: 5.0000 mg | Freq: Three times a day (TID) | INTRAMUSCULAR | Status: DC | PRN

## 2012-08-07 MED ORDER — EPHEDRINE SULFATE 50 MG/ML IJ SOLN
INTRAMUSCULAR | Status: DC | PRN
  Administered 2012-08-07: 10 mg via INTRAVENOUS

## 2012-08-07 MED ORDER — METHOCARBAMOL 500 MG PO TABS
500.0000 mg | ORAL_TABLET | Freq: Four times a day (QID) | ORAL | Status: DC | PRN
  Administered 2012-08-07 – 2012-08-11 (×11): 500 mg via ORAL
  Filled 2012-08-07 (×12): qty 1

## 2012-08-07 MED ORDER — ALUM & MAG HYDROXIDE-SIMETH 200-200-20 MG/5ML PO SUSP: 30.0000 mL | ORAL | Status: DC | PRN

## 2012-08-07 MED ORDER — NEOSTIGMINE METHYLSULFATE 1 MG/ML IJ SOLN
INTRAMUSCULAR | Status: DC | PRN
  Administered 2012-08-07 (×2): 2.5 mg via INTRAVENOUS

## 2012-08-07 MED ORDER — LACTATED RINGERS IV SOLN
INTRAVENOUS | Status: DC | PRN
  Administered 2012-08-07 (×2): via INTRAVENOUS

## 2012-08-07 MED ORDER — ONDANSETRON HCL 4 MG PO TABS: 4.0000 mg | ORAL_TABLET | Freq: Four times a day (QID) | ORAL | Status: DC | PRN

## 2012-08-07 MED ORDER — SODIUM CHLORIDE 0.9 % IR SOLN
Status: DC | PRN
  Administered 2012-08-07: 1000 mL

## 2012-08-07 MED ORDER — BUPIVACAINE HCL (PF) 0.25 % IJ SOLN
INTRAMUSCULAR | Status: DC | PRN
  Administered 2012-08-07: 10 mL

## 2012-08-07 MED ORDER — RIVAROXABAN 10 MG PO TABS: 10.0000 mg | ORAL_TABLET | Freq: Every day | ORAL | Status: DC

## 2012-08-07 MED ORDER — GLYCOPYRROLATE 0.2 MG/ML IJ SOLN: INTRAMUSCULAR | Status: DC | PRN

## 2012-08-07 MED ORDER — POLYETHYLENE GLYCOL 3350 17 G PO PACK: 17.0000 g | PACK | Freq: Every day | ORAL | Status: DC | PRN

## 2012-08-07 MED ORDER — PROMETHAZINE HCL 25 MG PO TABS: 25.0000 mg | ORAL_TABLET | Freq: Four times a day (QID) | ORAL | Status: DC | PRN

## 2012-08-07 MED ORDER — LIDOCAINE HCL (CARDIAC) 20 MG/ML IV SOLN
INTRAVENOUS | Status: DC | PRN
  Administered 2012-08-07: 50 mg via INTRAVENOUS

## 2012-08-07 MED ORDER — METHOCARBAMOL 500 MG PO TABS
ORAL_TABLET | ORAL | Status: AC
  Administered 2012-08-08: 500 mg
  Filled 2012-08-07: qty 1

## 2012-08-07 MED ORDER — FENTANYL CITRATE 0.05 MG/ML IJ SOLN
INTRAMUSCULAR | Status: DC | PRN
  Administered 2012-08-07: 50 ug via INTRAVENOUS
  Administered 2012-08-07: 100 ug via INTRAVENOUS
  Administered 2012-08-07 (×3): 50 ug via INTRAVENOUS
  Administered 2012-08-07 (×2): 100 ug via INTRAVENOUS
  Administered 2012-08-07: 50 ug via INTRAVENOUS

## 2012-08-07 MED ORDER — CALCIUM CARBONATE 600 MG PO TABS: 600.0000 mg | ORAL_TABLET | Freq: Every day | ORAL | Status: DC

## 2012-08-07 MED ORDER — ROCURONIUM BROMIDE 100 MG/10ML IV SOLN
INTRAVENOUS | Status: DC | PRN
  Administered 2012-08-07: 50 mg via INTRAVENOUS
  Administered 2012-08-07: 10 mg via INTRAVENOUS

## 2012-08-07 MED ORDER — FAMOTIDINE 20 MG PO TABS
20.0000 mg | ORAL_TABLET | Freq: Two times a day (BID) | ORAL | Status: DC
  Administered 2012-08-07 – 2012-08-11 (×8): 20 mg via ORAL
  Filled 2012-08-07 (×9): qty 1

## 2012-08-07 MED ORDER — CALCIUM CARBONATE 1250 (500 CA) MG PO TABS
1.0000 | ORAL_TABLET | Freq: Every day | ORAL | Status: DC
  Administered 2012-08-08 – 2012-08-11 (×4): 500 mg via ORAL
  Filled 2012-08-07 (×5): qty 1

## 2012-08-07 MED ORDER — LACTATED RINGERS IV SOLN
Freq: Once | INTRAVENOUS | Status: AC
  Administered 2012-08-07: 12:00:00 via INTRAVENOUS

## 2012-08-07 MED ORDER — HYDROMORPHONE HCL PF 1 MG/ML IJ SOLN
0.5000 mg | INTRAMUSCULAR | Status: DC | PRN
  Administered 2012-08-07 – 2012-08-09 (×6): 0.5 mg via INTRAVENOUS
  Filled 2012-08-07 (×6): qty 1

## 2012-08-07 MED ORDER — HYDROMORPHONE HCL PF 1 MG/ML IJ SOLN
0.2500 mg | INTRAMUSCULAR | Status: DC | PRN
  Administered 2012-08-07 (×2): 0.5 mg via INTRAVENOUS

## 2012-08-07 MED ORDER — DEXTROSE 5 % IV SOLN
INTRAVENOUS | Status: DC | PRN
  Administered 2012-08-07: 14:00:00 via INTRAVENOUS

## 2012-08-07 MED ORDER — DIPHENHYDRAMINE HCL 12.5 MG/5ML PO ELIX: 12.5000 mg | ORAL_SOLUTION | ORAL | Status: DC | PRN

## 2012-08-07 MED ORDER — ADULT MULTIVITAMIN W/MINERALS CH
1.0000 | ORAL_TABLET | Freq: Every day | ORAL | Status: DC
  Administered 2012-08-08 – 2012-08-11 (×4): 1 via ORAL
  Filled 2012-08-07 (×4): qty 1

## 2012-08-07 MED ORDER — PHENOL 1.4 % MT LIQD: 1.0000 | OROMUCOSAL | Status: DC | PRN

## 2012-08-07 MED ORDER — OXYCODONE HCL 5 MG PO TABS
5.0000 mg | ORAL_TABLET | Freq: Once | ORAL | Status: AC | PRN
  Administered 2012-08-07: 5 mg via ORAL

## 2012-08-07 MED ORDER — PROPOFOL 10 MG/ML IV BOLUS
INTRAVENOUS | Status: DC | PRN
  Administered 2012-08-07: 160 mg via INTRAVENOUS

## 2012-08-07 MED ORDER — OXYCODONE HCL 5 MG/5ML PO SOLN: 5.0000 mg | Freq: Once | ORAL | Status: AC | PRN

## 2012-08-07 MED ORDER — OXYCODONE HCL 5 MG PO TABS
5.0000 mg | ORAL_TABLET | ORAL | Status: DC | PRN
  Administered 2012-08-07 – 2012-08-11 (×22): 10 mg via ORAL
  Filled 2012-08-07 (×25): qty 2

## 2012-08-07 MED ORDER — MAGNESIUM CITRATE PO SOLN
1.0000 | Freq: Once | ORAL | Status: AC | PRN
  Filled 2012-08-07: qty 296

## 2012-08-07 MED ORDER — SENNA 8.6 MG PO TABS
1.0000 | ORAL_TABLET | Freq: Two times a day (BID) | ORAL | Status: DC
  Administered 2012-08-07 – 2012-08-11 (×8): 8.6 mg via ORAL
  Filled 2012-08-07 (×9): qty 1

## 2012-08-07 MED ORDER — ONDANSETRON HCL 4 MG/2ML IJ SOLN
INTRAMUSCULAR | Status: DC | PRN
  Administered 2012-08-07: 4 mg via INTRAVENOUS

## 2012-08-07 SURGICAL SUPPLY — 59 items
BENZOIN TINCTURE PRP APPL 2/3 (GAUZE/BANDAGES/DRESSINGS) ×2 IMPLANT
BLADE SAW SAG 73X25 THK (BLADE) ×1
BLADE SAW SGTL 73X25 THK (BLADE) ×1 IMPLANT
BRUSH FEMORAL CANAL (MISCELLANEOUS) IMPLANT
CAPT HIP PF MOP ×2 IMPLANT
CLOTH BEACON ORANGE TIMEOUT ST (SAFETY) ×2 IMPLANT
CLSR STERI-STRIP ANTIMIC 1/2X4 (GAUZE/BANDAGES/DRESSINGS) ×2 IMPLANT
COVER BACK TABLE 24X17X13 BIG (DRAPES) IMPLANT
COVER SURGICAL LIGHT HANDLE (MISCELLANEOUS) ×2 IMPLANT
DRAPE INCISE IOBAN 66X45 STRL (DRAPES) ×2 IMPLANT
DRAPE ORTHO SPLIT 77X108 STRL (DRAPES) ×2
DRAPE PROXIMA HALF (DRAPES) ×2 IMPLANT
DRAPE SURG ORHT 6 SPLT 77X108 (DRAPES) ×2 IMPLANT
DRAPE U-SHAPE 47X51 STRL (DRAPES) ×2 IMPLANT
DRILL BIT 5/64 (BIT) ×2 IMPLANT
DRSG MEPILEX BORDER 4X12 (GAUZE/BANDAGES/DRESSINGS) ×2 IMPLANT
DRSG MEPILEX BORDER 4X8 (GAUZE/BANDAGES/DRESSINGS) IMPLANT
DRSG PAD ABDOMINAL 8X10 ST (GAUZE/BANDAGES/DRESSINGS) ×4 IMPLANT
DURAPREP 26ML APPLICATOR (WOUND CARE) ×2 IMPLANT
ELECT CAUTERY BLADE 6.4 (BLADE) ×2 IMPLANT
ELECT REM PT RETURN 9FT ADLT (ELECTROSURGICAL) ×2
ELECTRODE REM PT RTRN 9FT ADLT (ELECTROSURGICAL) ×1 IMPLANT
GLOVE BIOGEL PI ORTHO PRO SZ8 (GLOVE) ×1
GLOVE ORTHO TXT STRL SZ7.5 (GLOVE) ×2 IMPLANT
GLOVE PI ORTHO PRO STRL SZ8 (GLOVE) ×1 IMPLANT
GLOVE SURG ORTHO 8.0 STRL STRW (GLOVE) ×4 IMPLANT
GOWN STRL NON-REIN LRG LVL3 (GOWN DISPOSABLE) ×2 IMPLANT
GOWN STRL REIN XL XLG (GOWN DISPOSABLE) ×6 IMPLANT
HANDPIECE INTERPULSE COAX TIP (DISPOSABLE)
HOOD PEEL AWAY FACE SHEILD DIS (HOOD) ×4 IMPLANT
KIT BASIN OR (CUSTOM PROCEDURE TRAY) ×2 IMPLANT
KIT ROOM TURNOVER OR (KITS) ×2 IMPLANT
MANIFOLD NEPTUNE II (INSTRUMENTS) ×2 IMPLANT
NEEDLE HYPO 25GX1X1/2 BEV (NEEDLE) ×2 IMPLANT
NS IRRIG 1000ML POUR BTL (IV SOLUTION) ×2 IMPLANT
PACK TOTAL JOINT (CUSTOM PROCEDURE TRAY) ×2 IMPLANT
PAD ARMBOARD 7.5X6 YLW CONV (MISCELLANEOUS) ×4 IMPLANT
PILLOW ABDUCTION HIP (SOFTGOODS) ×2 IMPLANT
PRESSURIZER FEMORAL UNIV (MISCELLANEOUS) IMPLANT
RETRIEVER SUT HEWSON (MISCELLANEOUS) ×2 IMPLANT
SET HNDPC FAN SPRY TIP SCT (DISPOSABLE) IMPLANT
SPONGE GAUZE 4X4 12PLY (GAUZE/BANDAGES/DRESSINGS) ×2 IMPLANT
SPONGE LAP 4X18 X RAY DECT (DISPOSABLE) IMPLANT
STRIP CLOSURE SKIN 1/2X4 (GAUZE/BANDAGES/DRESSINGS) ×4 IMPLANT
SUCTION FRAZIER TIP 10 FR DISP (SUCTIONS) ×2 IMPLANT
SUT FIBERWIRE #2 38 REV NDL BL (SUTURE) ×4
SUT MNCRL AB 4-0 PS2 18 (SUTURE) IMPLANT
SUT VIC AB 0 CT1 27 (SUTURE) ×1
SUT VIC AB 0 CT1 27XBRD ANBCTR (SUTURE) ×1 IMPLANT
SUT VIC AB 2-0 CT1 27 (SUTURE) ×1
SUT VIC AB 2-0 CT1 TAPERPNT 27 (SUTURE) ×1 IMPLANT
SUT VIC AB 3-0 SH 18 (SUTURE) ×2 IMPLANT
SUTURE FIBERWR#2 38 REV NDL BL (SUTURE) ×2 IMPLANT
SYR CONTROL 10ML LL (SYRINGE) ×2 IMPLANT
TOWEL OR 17X24 6PK STRL BLUE (TOWEL DISPOSABLE) ×2 IMPLANT
TOWEL OR 17X26 10 PK STRL BLUE (TOWEL DISPOSABLE) ×2 IMPLANT
TOWER CARTRIDGE SMART MIX (DISPOSABLE) IMPLANT
TRAY FOLEY CATH 16FRSI W/METER (SET/KITS/TRAYS/PACK) ×2 IMPLANT
WATER STERILE IRR 1000ML POUR (IV SOLUTION) ×4 IMPLANT

## 2012-08-07 NOTE — Preoperative (Signed)
Beta Blockers   Reason not to administer Beta Blockers:Not Applicable 

## 2012-08-07 NOTE — Anesthesia Procedure Notes (Signed)
Procedure Name: Intubation Date/Time: 08/07/2012 2:08 PM Performed by: Darcey Nora B Pre-anesthesia Checklist: Patient identified, Suction available, Patient being monitored and Emergency Drugs available Patient Re-evaluated:Patient Re-evaluated prior to inductionOxygen Delivery Method: Circle system utilized Preoxygenation: Pre-oxygenation with 100% oxygen Intubation Type: IV induction Ventilation: Mask ventilation without difficulty and Oral airway inserted - appropriate to patient size Laryngoscope Size: Mac and 4 Grade View: Grade II Tube type: Oral Tube size: 7.5 mm Number of attempts: 1 Airway Equipment and Method: Stylet Placement Confirmation: ETT inserted through vocal cords under direct vision,  breath sounds checked- equal and bilateral and positive ETCO2 Secured at: 22 (cm at teeth) cm Tube secured with: Tape Dental Injury: Teeth and Oropharynx as per pre-operative assessment  Comments: Neck extension limited even after asleep.Marland KitchenMarland Kitchen

## 2012-08-07 NOTE — Transfer of Care (Signed)
Immediate Anesthesia Transfer of Care Note  Patient: Frederick Hall  Procedure(s) Performed: Procedure(s): TOTAL HIP ARTHROPLASTY- left  (Left)  Patient Location: PACU  Anesthesia Type:General  Level of Consciousness: awake, alert  and oriented  Airway & Oxygen Therapy: Patient Spontanous Breathing and Patient connected to nasal cannula oxygen  Post-op Assessment: Report given to PACU RN and Post -op Vital signs reviewed and stable  Post vital signs: Reviewed and stable  Complications: No apparent anesthesia complications

## 2012-08-07 NOTE — Anesthesia Preprocedure Evaluation (Addendum)
Anesthesia Evaluation  Patient identified by MRN, date of birth, ID band Patient awake    Reviewed: Allergy & Precautions, H&P , NPO status , Patient's Chart, lab work & pertinent test results  Airway Mallampati: II TM Distance: >3 FB Neck ROM: Full    Dental no notable dental hx. (+) Teeth Intact and Dental Advisory Given   Pulmonary asthma , former smoker,  breath sounds clear to auscultation  Pulmonary exam normal       Cardiovascular hypertension, On Medications Rhythm:Regular Rate:Normal     Neuro/Psych negative neurological ROS  negative psych ROS   GI/Hepatic Neg liver ROS, GERD-  Medicated and Controlled,  Endo/Other  negative endocrine ROS  Renal/GU negative Renal ROS  negative genitourinary   Musculoskeletal   Abdominal   Peds  Hematology negative hematology ROS (+)   Anesthesia Other Findings   Reproductive/Obstetrics negative OB ROS                          Anesthesia Physical Anesthesia Plan  ASA: II  Anesthesia Plan: General   Post-op Pain Management:    Induction: Intravenous  Airway Management Planned: Oral ETT  Additional Equipment:   Intra-op Plan:   Post-operative Plan: Extubation in OR  Informed Consent: I have reviewed the patients History and Physical, chart, labs and discussed the procedure including the risks, benefits and alternatives for the proposed anesthesia with the patient or authorized representative who has indicated his/her understanding and acceptance.   Dental advisory given and Dental Advisory Given  Plan Discussed with: CRNA and Anesthesiologist  Anesthesia Plan Comments:        Anesthesia Quick Evaluation

## 2012-08-07 NOTE — Op Note (Signed)
08/07/2012  4:09 PM  PATIENT:  Frederick Hall   MRN: [id number]  PRE-OPERATIVE DIAGNOSIS:  LEFT HIP DJD  POST-OPERATIVE DIAGNOSIS:  LEFT HIP DJD  PROCEDURE:  Procedure(s): TOTAL HIP ARTHROPLASTY- left   PREOPERATIVE INDICATIONS:    BARNARD SHARPS is an 72 y.o. male who has a diagnosis of Osteoarthritis of left hip and elected for surgical management after failing conservative treatment.  The risks benefits and alternatives were discussed with the patient including but not limited to the risks of nonoperative treatment, versus surgical intervention including infection, bleeding, nerve injury, periprosthetic fracture, the need for revision surgery, dislocation, leg length discrepancy, blood clots, cardiopulmonary complications, morbidity, mortality, among others, and they were willing to proceed.     OPERATIVE REPORT     SURGEON:  Teryl Lucy, MD    ASSISTANT:  Janace Litten, OPA-C  (Present throughout the entire procedure,  necessary for completion of procedure in a timely manner, assisting with retraction, instrumentation, and closure)     ANESTHESIA:  General    COMPLICATIONS:  None.     COMPONENTS:  [employer] fit femur size 6 with a 36 mm +8.5 head ball and a gription acetabular shell size 54 with a 10 degree lipped polyethylene liner    PROCEDURE IN DETAIL:   The patient was met in the holding area and  identified.  The appropriate hip was identified and marked at the operative site.  The patient was then transported to the OR  and  placed under general anesthesia.  At that point, the patient was  placed in the lateral decubitus position with the operative side up and  secured to the operating room table and all bony prominences padded.     The operative lower extremity was prepped from the iliac crest to the distal leg.  Sterile draping was performed.  Time out was performed prior to incision.      A routine posterolateral approach was utilized via sharp  dissection  carried down to the subcutaneous tissue.  Gross bleeders were Bovie coagulated.  The iliotibial band was identified and incised along the length of the skin incision.  Self-retaining retractors were  inserted.  With the hip internally rotated, the short external rotators  were identified. The piriformis and capsule was tagged with FiberWire, and the hip capsule released in a T-type fashion.  The femoral neck was exposed, and I resected the femoral neck using the appropriate jig. This was performed at approximately a thumb's breadth above the lesser trochanter. Given the fact that I used an 8.5 neck length on the opposite side, I actually made the cut slightly higher, in order to preserve neck length. This was actually more broad than my standard cut, and it was smaller than a thumb's breadth.    I then exposed the deep acetabulum, cleared out any tissue including the ligamentum teres.  A wing retractor was placed.  After adequate visualization, I excised the labrum, and then sequentially reamed.  The contralateral side was slightly uncovered, and somewhat proud, and so I makes her to medialize deeper on this side, and the bone quality was quite good, and reaming was fairly challenging, but I was able to get adequate seating. There was circumferential osteophytes, particularly anteriorly and posteriorly. These were removed with a rongeur. I placed the trial acetabulum, which seated nicely, and then impacted the real cup into place.  Appropriate version and inclination was confirmed clinically matching their bony anatomy, and also with the use of  the jig. Initially, I was slightly flat, and readjusted in order to have appropriate inclination.  A trial polyethylene liner was placed and the wing retractor removed.    I then prepared the proximal femur using the cookie-cutter, the lateralizing reamer, and then sequentially reamed and broached. On the previous side, the greater trochanter was fairly  over home, and the stem went in slight varus. This time, I took an extra steps to make sure that I completely lateralize the stem, which ultimately allowed a 6, rather than 5.  A trial broach, neck, and head was utilized, and I reduced the hip and with the +1.5 it was too short, with the 5 mm it was better, but still slightly short, with positive shuck, and it did not restore leg length, and was still not optimally stable. I went to the 8.5, and it was found to have excellent stability with functional range of motion. The trial components were then removed, and the real polyethylene liner was placed with the lip directed posteriorly.  I then impacted the real femoral prosthesis into place into the appropriate version, matching the normal anatomy, and I impacted the real head ball into place. The hip was then reduced and taken through functional range of motion and found to have excellent stability. Leg lengths were restored.  I then used a 2 mm drill bits to pass the FiberWire suture from the capsule and piriformis through the greater trochanter, and secured this. Excellent posterior capsular repair was achieved. I also closed the T in the capsule.  I then irrigated the hip copiously again with pulse lavage, and repaired the fascia with Vicryl, followed by Vicryl for the subcutaneous tissue, Monocryl for the skin, Steri-Strips and sterile gauze. The wounds were injected. The patient was then awakened and returned to PACU in stable and satisfactory condition. There were no complications.  Teryl Lucy, MD Orthopedic Surgeon [phone]   08/07/2012 4:09 PM

## 2012-08-07 NOTE — H&P (Signed)
PREOPERATIVE H&P  Chief Complaint: LEFT HIP DJD  HPI: Frederick Hall is a 72 y.o. male who presents for preoperative history and physical with a diagnosis of LEFT HIP DJD. Symptoms are rated as moderate to severe, and have been worsening.  This is significantly impairing activities of daily living.  He has elected for surgical management. He had a right total hip replacement performed by myself about 6 weeks ago, and had excellent improvement in symptoms, and wishes for his left side to be done as well. He says the pain has progressed significantly over the past couple of weeks, to where he now can no longer walk without significant limp, and has required assistive devices as well as medications.  Past Medical History  Diagnosis Date  . Hypertension   . Asthma 2005    "attack x 1"  . GERD (gastroesophageal reflux disease)   . Arthritis   . Osteoarthritis of right hip 05/23/2012  . Cancer     skin   Past Surgical History  Procedure Laterality Date  . Knee arthroscopy Left   . Shoulder arthroscopy Right   . Lipoma excision      multiple  . Total hip arthroplasty Right 05/23/2012    Procedure: TOTAL HIP ARTHROPLASTY;  Surgeon: Eulas Post, MD;  Location: [facility];  Service: Orthopedics;  Laterality: Right;  . Tonsillectomy     History   Social History  . Marital Status: Married    Spouse Name: N/A    Number of Children: N/A  . Years of Education: N/A   Social History Main Topics  . Smoking status: Former Smoker -- 1.00 packs/day for 15 years    Types: Cigarettes    Quit date: 05/18/1972  . Smokeless tobacco: Never Used     Comment: social alcohol  . Alcohol Use: Yes     Comment: 3  wine or beer  . Drug Use: No  . Sexually Active: None   Other Topics Concern  . None   Social History Narrative  . None   History reviewed. No pertinent family history. Allergies  Allergen Reactions  . Penicillins Anaphylaxis    Childhood allergy. Tolerated cefazolin 05/23/12.     Prior to Admission medications   Medication Sig Start Date End Date Taking? Authorizing Provider  calcium carbonate (OS-CAL) 600 MG TABS Take 600 mg by mouth daily.   Yes Historical Provider, MD  lisinopril (PRINIVIL,ZESTRIL) 20 MG tablet Take 20 mg by mouth every morning.   Yes Historical Provider, MD  Multiple Vitamin (MULTIVITAMIN WITH MINERALS) TABS Take 1 tablet by mouth daily.   Yes Historical Provider, MD  ranitidine (ZANTAC) 150 MG tablet Take 150 mg by mouth every morning.   Yes Historical Provider, MD  bisacodyl (DULCOLAX) 5 MG EC tablet Take 1 tablet (5 mg total) by mouth daily as needed for constipation. 05/23/12   Eulas Post, MD  sennosides-docusate sodium (SENOKOT-S) 8.6-50 MG tablet Take 1 tablet by mouth daily as needed for constipation.    Historical Provider, MD     Positive ROS: All other systems have been reviewed and were otherwise negative with the exception of those mentioned in the HPI and as above.  Physical Exam: General: Alert, no acute distress Cardiovascular: No pedal edema Respiratory: No cyanosis, no use of accessory musculature GI: No organomegaly, abdomen is soft and non-tender Skin: No lesions in the area of chief complaint Neurologic: Sensation intact distally Psychiatric: Patient is competent for consent with normal mood and affect Lymphatic:  No axillary or cervical lymphadenopathy  MUSCULOSKELETAL: Left hip has range of motion 0-80, with very limited internal and external rotation. EHL and FHL are firing.  Assessment: LEFT HIP DJD  Plan: Plan for Procedure(s): TOTAL HIP ARTHROPLASTY  The risks benefits and alternatives were discussed with the patient including but not limited to the risks of nonoperative treatment, versus surgical intervention including infection, bleeding, nerve injury, periprosthetic fracture, the need for revision surgery, dislocation, leg length discrepancy, blood clots, cardiopulmonary complications, morbidity,  mortality, among others, and they were willing to proceed.     Eulas Post, MD Cell [phone]   08/07/2012 1:04 PM

## 2012-08-08 ENCOUNTER — Encounter (HOSPITAL_COMMUNITY): Payer: Self-pay | Admitting: Orthopedic Surgery

## 2012-08-08 LAB — CBC
HCT: 37 % — ABNORMAL LOW (ref 39.0–52.0)
Platelets: 191 10*3/uL (ref 150–400)
RDW: 15.1 % (ref 11.5–15.5)
WBC: 8.2 10*3/uL (ref 4.0–10.5)

## 2012-08-08 LAB — BASIC METABOLIC PANEL
BUN: 12 mg/dL (ref 6–23)
Chloride: 101 mEq/L (ref 96–112)
GFR calc Af Amer: >90 mL/min (ref >90)
Potassium: 4.4 mEq/L (ref 3.5–5.1)
Sodium: 136 mEq/L (ref 135–145)

## 2012-08-08 NOTE — Evaluation (Signed)
Physical Therapy Evaluation Patient Details Name: Frederick Hall MRN: [id number] DOB: [birth year] Today's Date: 08/08/2012 Time: 4401-0272 PT Time Calculation (min): 28 min  PT Assessment / Plan / Recommendation History of Present Illness  s/p posterior left THA  Clinical Impression  Pt very motivated and willing to get OOB with PT.  Pt will benefit from acute PT services to improve overall mobility and prepare for safe d/c home with wife.     PT Assessment  Patient needs continued PT services    Follow Up Recommendations  Home health PT;Supervision/Assistance - 24 hour    Equipment Recommendations  None recommended by PT    Frequency 7X/week    Precautions / Restrictions Precautions Precautions: Posterior Hip;Fall Restrictions Weight Bearing Restrictions: Yes LLE Weight Bearing: Weight bearing as tolerated   Pertinent Vitals/Pain 6/10 left hip pain; premedicated       Mobility  Bed Mobility Bed Mobility: Supine to Sit Supine to Sit: 4: Min assist;With rails;HOB flat Details for Bed Mobility Assistance: (A) with LLE OOB and cues for proper technique to prevent left LE internal rotation Transfers Transfers: Sit to Stand;Stand to Sit Sit to Stand: 4: Min assist;From bed Stand to Sit: 4: Min assist;To chair/3-in-1 Details for Transfer Assistance: (A) to initiate transfer with max cues for hand placement Ambulation/Gait Ambulation/Gait Assistance: 4: Min guard Ambulation Distance (Feet): 25 Feet Assistive device: Rolling walker Ambulation/Gait Assistance Details: Minguard for safety with max cues for step sequence and RW placement Gait Pattern: Step-through pattern;Decreased stride length;Decreased stance time - left;Shuffle;Trunk flexed Gait velocity: decreased due to pain Stairs: No    Exercises Total Joint Exercises Ankle Circles/Pumps: Both;10 reps   PT Diagnosis: Difficulty walking;Generalized weakness;Acute pain  PT Problem List: Decreased  strength;Decreased range of motion;Decreased activity tolerance;Decreased balance;Decreased mobility;Decreased knowledge of use of DME;Decreased knowledge of precautions;Pain PT Treatment Interventions: DME instruction;Gait training;Stair training;Functional mobility training;Therapeutic activities;Therapeutic exercise;Balance training;Patient/family education     PT Goals(Current goals can be found in the care plan section) Acute Rehab PT Goals Patient Stated Goal: To go home PT Goal Formulation: With patient/family Time For Goal Achievement: 08/15/12 Potential to Achieve Goals: Good  Visit Information  Last PT Received On: 08/08/12 History of Present Illness: s/p posterior left THA       Prior Functioning  Home Living Family/patient expects to be discharged to:: Private residence Living Arrangements: Spouse/significant other Available Help at Discharge: Family Type of Home: House Home Access: Stairs to enter Secretary/administrator of Steps: 8 Entrance Stairs-Rails: Right Home Layout: One level Home Equipment: Crutches;Walker - 2 wheels;Bedside commode Additional Comments: 3:1 was delivered to room Prior Function Level of Independence: Independent Communication Communication: No difficulties Dominant Hand: Right    Cognition  Cognition Arousal/Alertness: Awake/alert Behavior During Therapy: WFL for tasks assessed/performed Overall Cognitive Status: Within Functional Limits for tasks assessed    Extremity/Trunk Assessment Lower Extremity Assessment Lower Extremity Assessment: LLE deficits/detail LLE: Unable to fully assess due to pain   Balance    End of Session PT - End of Session Equipment Utilized During Treatment: Gait belt Activity Tolerance: Patient tolerated treatment well Patient left: in chair;with call bell/phone within reach;with family/visitor present Nurse Communication: Mobility status  GP     Christine Morton 08/08/2012, 9:39 AM Jake Shark, PT  DPT [phone]

## 2012-08-08 NOTE — Progress Notes (Signed)
UR COMPLETED  

## 2012-08-08 NOTE — Evaluation (Addendum)
Occupational Therapy Evaluation Patient Details Name: Frederick Hall MRN: [id number] DOB: 1940/07/05 Today's Date: 08/08/2012 Time: 4098-1191 OT Time Calculation (min): 27 min  OT Assessment / Plan / Recommendation History of present illness s/p posterior left THA   Clinical Impression   Pt presents with below problem list. Pt will benefit from acute OT services to increase independence and safety prior to d/c.     OT Assessment  Patient needs continued OT Services    Follow Up Recommendations  Home health OT;Supervision/Assistance - 24 hour    Barriers to Discharge      Equipment Recommendations  Other (comment) (tbd)    Recommendations for Other Services    Frequency  Min 2X/week    Precautions / Restrictions Precautions Precautions: Posterior Hip;Fall Restrictions Weight Bearing Restrictions: Yes LLE Weight Bearing: Weight bearing as tolerated   Pertinent Vitals/Pain Pain 7/10 in hip. Nurse notified. Repositioned.     ADL  Eating/Feeding: Independent Where Assessed - Eating/Feeding: Chair Grooming: Set up;Supervision/safety Where Assessed - Grooming: Supported sitting Upper Body Bathing: Set up;Supervision/safety Where Assessed - Upper Body Bathing: Supported sitting Lower Body Bathing: Moderate assistance Where Assessed - Lower Body Bathing: Supported sit to stand Upper Body Dressing: Set up;Supervision/safety Where Assessed - Upper Body Dressing: Supported sitting Lower Body Dressing: Moderate assistance Where Assessed - Lower Body Dressing: Supported sit to stand Toilet Transfer: Counsellor Method: Sit to Barista: Other (comment) (from recliner chair) Tub/Shower Transfer Method: Not assessed Transfers/Ambulation Related to ADLs: Minguard ADL Comments: Pt at overall Mod A level for LB ADLs without AE. Pt states wife helps him and he has a Sports administrator he uses sometimes for LB dressing. Pt states he would like to  review and practice with reacher tomorrow. OT also educated on tub transfer techniques and planning to practice this tomorrow.  Educated on dressing technique.     OT Diagnosis: Acute pain  OT Problem List: Decreased range of motion;Decreased activity tolerance;Impaired balance (sitting and/or standing);Decreased knowledge of use of DME or AE;Pain;Decreased knowledge of precautions;Decreased safety awareness OT Treatment Interventions: Self-care/ADL training;DME and/or AE instruction;Therapeutic activities;Patient/family education;Balance training   OT Goals(Current goals can be found in the care plan section) Acute Rehab OT Goals Patient Stated Goal: To urinate OT Goal Formulation: With patient Time For Goal Achievement: 08/15/12 Potential to Achieve Goals: Good ADL Goals Pt Will Perform Lower Body Dressing: with supervision;sit to/from stand;with adaptive equipment Pt Will Transfer to Toilet: with modified independence;ambulating;bedside commode Pt Will Perform Toileting - Clothing Manipulation and hygiene: with modified independence;sit to/from stand Pt Will Perform Tub/Shower Transfer: Tub transfer;ambulating;with supervision;shower seat Additional ADL Goal #1: Pt will independently verbalize and demonstrate 3/3 hip precautions.  Visit Information  Last OT Received On: 08/08/12 Assistance Needed: +1 History of Present Illness: s/p posterior left THA       Prior Functioning     Home Living Family/patient expects to be discharged to:: Private residence Living Arrangements: Spouse/significant other Available Help at Discharge: Family Type of Home: House Home Access: Stairs to enter Secretary/administrator of Steps: 8 Entrance Stairs-Rails: Right Home Layout: One level Home Equipment: Crutches;Walker - 2 wheels;Bedside commode; reacher Additional Comments: 3:1 was delivered to room Prior Function Level of Independence: Independent Communication Communication: No  difficulties Dominant Hand: Right         Vision/Perception     Cognition  Cognition Arousal/Alertness: Awake/alert Behavior During Therapy: WFL for tasks assessed/performed Overall Cognitive Status: Within Functional Limits for tasks assessed  Extremity/Trunk Assessment Upper Extremity Assessment Upper Extremity Assessment: Overall WFL for tasks assessed     Mobility Bed Mobility Bed Mobility: Sit to Supine;Scooting to HOB Sit to Supine: 4: Min assist;HOB flat;With rail Scooting to [facility]: 5: Supervision;With rail Details for Bed Mobility Assistance: A with LLE Transfers Transfers: Sit to Stand;Stand to Sit Sit to Stand: 4: Min guard;With upper extremity assist;From chair/3-in-1 Stand to Sit: 4: Min guard;With upper extremity assist;To bed Details for Transfer Assistance: Minguard for safety. Cues to have walker near and in front of him before standing.        Balance     End of Session OT - End of Session Equipment Utilized During Treatment: Gait belt;Rolling walker Activity Tolerance: Patient limited by pain Patient left: in bed;with call bell/phone within reach Nurse Communication: Mobility status;Other (comment) (pain level)  GO     Earlie Raveling OTR/L [phone] 08/08/2012, 5:18 PM

## 2012-08-08 NOTE — Progress Notes (Signed)
Physical Therapy Treatment Patient Details Name: Frederick Hall MRN: [id number] DOB: [birth year] Today's Date: 08/08/2012 Time: 1525-1550 PT Time Calculation (min): 25 min  PT Assessment / Plan / Recommendation  History of Present Illness s/p posterior left THA   PT Comments   Pt willing to ambulate however continues to be focused on the fact pt is unable to urinate.  Pt with increased pain at end of session therefore limited HEP.     Follow Up Recommendations  Home health PT;Supervision/Assistance - 24 hour     Equipment Recommendations  None recommended by PT    Frequency 7X/week   Progress towards PT Goals Progress towards PT goals: Progressing toward goals  Plan Current plan remains appropriate    Precautions / Restrictions Precautions Precautions: Posterior Hip;Fall Restrictions Weight Bearing Restrictions: Yes LLE Weight Bearing: Weight bearing as tolerated   Pertinent Vitals/Pain 7/10 left hip pain; RN notified    Mobility  Bed Mobility Bed Mobility: Not assessed (Pt sitting EOB when entering room.) Transfers Transfers: Sit to Stand;Stand to Sit Sit to Stand: 4: Min assist;From bed Stand to Sit: 4: Min assist;To chair/3-in-1 Details for Transfer Assistance: (A) to initiate transfer with max cues for hand placement Ambulation/Gait Ambulation/Gait Assistance: 4: Min guard Ambulation Distance (Feet): 100 Feet Assistive device: Rolling walker Ambulation/Gait Assistance Details: Minguard for safety with max cues for step sequence and RW placement Gait Pattern: Step-through pattern;Decreased stride length;Decreased stance time - left;Shuffle;Trunk flexed Gait velocity: decreased due to pain Stairs: No    Exercises Total Joint Exercises Ankle Circles/Pumps: Both;10 reps Quad Sets: Strengthening;Both;10 reps   PT Diagnosis:    PT Problem List:   PT Treatment Interventions:     PT Goals (current goals can now be found in the care plan section) Acute Rehab PT  Goals Patient Stated Goal: To go home PT Goal Formulation: With patient/family Time For Goal Achievement: 08/15/12 Potential to Achieve Goals: Good  Visit Information  Last PT Received On: 08/08/12 Assistance Needed: +1 History of Present Illness: s/p posterior left THA    Subjective Data  Subjective: "I'm so worried about not being able to urniate." Patient Stated Goal: To go home   Cognition  Cognition Arousal/Alertness: Awake/alert Behavior During Therapy: WFL for tasks assessed/performed Overall Cognitive Status: Within Functional Limits for tasks assessed    Balance     End of Session PT - End of Session Equipment Utilized During Treatment: Gait belt Activity Tolerance: Patient tolerated treatment well Patient left: in chair;with call bell/phone within reach;with family/visitor present Nurse Communication: Mobility status   GP     Frederick Hall 08/08/2012, 4:34 PM Jake Shark, PT DPT [phone]

## 2012-08-08 NOTE — Progress Notes (Signed)
Patient ID: Frederick Hall, male   DOB: July 20, 1940, 72 y.o.   MRN: [id number]     Subjective:  Patient reports pain as mild to moderate.  Patient denies any CP or SOB  Objective:   VITALS:   Filed Vitals:   08/08/12 0000 08/08/12 0200 08/08/12 0400 08/08/12 0507  BP:  115/69  121/76  Pulse:  68  79  Temp:  98.3 F (36.8 C)  97.7 F (36.5 C)  TempSrc:      Resp: 17 16 18 18   SpO2: 100% 100% 100% 100%    ABD soft Sensation intact distally Dorsiflexion/Plantar flexion intact Incision: dressing C/D/I and no drainage   Lab Results  Component Value Date   WBC 8.2 08/08/2012   HGB 11.6* 08/08/2012   HCT 37.0* 08/08/2012   MCV 95.6 08/08/2012   PLT 191 08/08/2012     Assessment/Plan: 1 Day Post-Op   Principal Problem:   Osteoarthritis of left hip   Advance diet Up with therapy WBAT  Okay to DC abduction pillow Plan dressing change tomorrow    Haskel Khan 08/08/2012, 7:07 AM   Teryl Lucy, MD Cell [phone]

## 2012-08-08 NOTE — Progress Notes (Signed)
08/08/12 Patient set up with [facility] for HHPT, OT and RN by MD office. Spoke with patient, no change in discharge plan. Patient already has rolling walker and 3N1, no equipment needs identified.Patient's wife will be available to assist after discharge. Jacquelynn Cree  RN, BSN, CCM

## 2012-08-08 NOTE — Anesthesia Postprocedure Evaluation (Signed)
  Anesthesia Post-op Note  Patient: Frederick Hall  Procedure(s) Performed: Procedure(s): TOTAL HIP ARTHROPLASTY- left  (Left)  Patient Location: Nursing Unit  Anesthesia Type:General  Level of Consciousness: awake, alert  and oriented  Airway and Oxygen Therapy: Patient Spontanous Breathing  Post-op Pain: mild  Post-op Assessment: Post-op Vital signs reviewed and Patient's Cardiovascular Status Stable  Post-op Vital Signs: Reviewed and stable  Complications: No apparent anesthesia complications

## 2012-08-09 LAB — CBC
HCT: 29.3 % — ABNORMAL LOW (ref 39.0–52.0)
Hemoglobin: 9.8 g/dL — ABNORMAL LOW (ref 13.0–17.0)
RBC: 3.19 MIL/uL — ABNORMAL LOW (ref 4.22–5.81)
WBC: 10.7 10*3/uL — ABNORMAL HIGH (ref 4.0–10.5)

## 2012-08-09 LAB — BASIC METABOLIC PANEL
BUN: 14 mg/dL (ref 6–23)
CO2: 23 mEq/L (ref 19–32)
Chloride: 93 mEq/L — ABNORMAL LOW (ref 96–112)
Glucose, Bld: 115 mg/dL — ABNORMAL HIGH (ref 70–99)
Potassium: 3.9 mEq/L (ref 3.5–5.1)

## 2012-08-09 NOTE — Progress Notes (Signed)
Patient ID: Frederick Hall, male   DOB: [birth year], 72 y.o.   MRN: [id number]     Subjective:  Patient reports pain as mild to moderate.  Patient states that he is in more pain but is doing okay. Denies any CP or SOB.  Bowels and bladder now working.  Objective:   VITALS:   Filed Vitals:   08/08/12 1400 08/08/12 1600 08/08/12 2221 08/09/12 0544  BP: 100/60  140/77 100/54  Pulse: 69  115 87  Temp: 97.6 F (36.4 C)  98.7 F (37.1 C) 100.1 F (37.8 C)  TempSrc:      Resp: 17 15 16 16   SpO2: 95% 98% 92% 97%    ABD soft Sensation intact distally Dorsiflexion/Plantar flexion intact Incision: dressing C/D/I and scant drainage   Lab Results  Component Value Date   WBC 8.2 08/08/2012   HGB 11.6* 08/08/2012   HCT 37.0* 08/08/2012   MCV 95.6 08/08/2012   PLT 191 08/08/2012     Assessment/Plan: 2 Days Post-Op   Principal Problem:   Osteoarthritis of left hip   Advance diet Up with therapy Plan for discharge tomorrow Leotis Shames 08/09/2012, 7:20 AM   Teryl Lucy, MD Cell [phone]

## 2012-08-09 NOTE — Progress Notes (Signed)
Occupational Therapy Treatment Patient Details Name: Frederick Hall MRN: [id number] DOB: [birth year] Today's Date: 08/09/2012 Time: 3086-5784 OT Time Calculation (min): 27 min  OT Assessment / Plan / Recommendation  History of present illness s/p posterior left THA   OT comments  Pt very lethargic during session. Pt ambulated in room and OT educated/demonstrated on use of reacher for LB dressing. Pt not wanting to practice this today as he was too tired. Plan to practice tub transfer, LB ADLs, and toileting tomorrow.   Follow Up Recommendations  Home health OT;Supervision/Assistance - 24 hour    Barriers to Discharge       Equipment Recommendations  Other (comment)    Recommendations for Other Services    Frequency Min 2X/week   Progress towards OT Goals Progress towards OT goals: Not progressing toward goals - comment  Plan Discharge plan remains appropriate    Precautions / Restrictions Precautions Precautions: Posterior Hip;Fall Precaution Booklet Issued: Yes (comment) Precaution Comments: Pt stated 1/3 precautions. Restrictions Weight Bearing Restrictions: Yes LLE Weight Bearing: Weight bearing as tolerated   Pertinent Vitals/Pain Pain 3/10. Repositioned. Increased activity.    ADL  Toilet Transfer: Simulated;Moderate assistance Toilet Transfer Method: Sit to stand Toilet Transfer Equipment: Other (comment) (from recliner chair) Equipment Used: Rolling walker;Reacher Transfers/Ambulation Related to ADLs: Minguard for ambulation. Mod A to stand (pt lethargic) ADL Comments: Pt very lethargic and taking a long time to respond to therapist. Nurse notifed of this. Pt stood from chair and ambulated around bed. OT demonstrated use of reacher for donning shorts and educated on dressing technique.  Pt needing to practice tub transfer tomorrow as well as LB ADLs, and toileting.    OT Diagnosis:    OT Problem List:   OT Treatment Interventions:     OT Goals(current goals  can now be found in the care plan section) Acute Rehab OT Goals Patient Stated Goal: not stated OT Goal Formulation: With patient Time For Goal Achievement: 08/15/12 Potential to Achieve Goals: Good ADL Goals Pt Will Perform Lower Body Dressing: with supervision;sit to/from stand;with adaptive equipment Pt Will Transfer to Toilet: with modified independence;ambulating;bedside commode Pt Will Perform Toileting - Clothing Manipulation and hygiene: with modified independence;sit to/from stand Pt Will Perform Tub/Shower Transfer: Tub transfer;ambulating;with supervision;shower seat Additional ADL Goal #1: Pt will independently verbalize and demonstrate 3/3 hip precautions.  Visit Information  Last OT Received On: 08/09/12 Assistance Needed: +1 History of Present Illness: s/p posterior left THA    Subjective Data      Prior Functioning       Cognition  Cognition Arousal/Alertness: Lethargic;Suspect due to medications Behavior During Therapy: [facility] for tasks assessed/performed Overall Cognitive Status: Within Functional Limits for tasks assessed    Mobility  Bed Mobility Bed Mobility: Not assessed Transfers Transfers: Sit to Stand;Stand to Sit Sit to Stand: 3: Mod assist;With upper extremity assist;From chair/3-in-1 Stand to Sit: 4: Min assist;With upper extremity assist;To chair/3-in-1 Details for Transfer Assistance: Mod A to stand from recliner chair. Suspect more assistance required due to pt being lethargic from meds.          End of Session OT - End of Session Equipment Utilized During Treatment: Gait belt;Rolling walker Activity Tolerance: Patient limited by lethargy Patient left: in chair;with call bell/phone within reach Nurse Communication: Mobility status;Other (comment) (lethargic)  GO     Earlie Raveling OTR/L [phone] 08/09/2012, 4:42 PM

## 2012-08-09 NOTE — Progress Notes (Signed)
Physical Therapy Treatment Patient Details Name: Frederick Hall MRN: [id number] DOB: [birth year] Today's Date: 08/09/2012 Time: 1411-1436 PT Time Calculation (min): 25 min  PT Assessment / Plan / Recommendation  History of Present Illness s/p posterior left THA   PT Comments   Pt progressing with therapy,plans to D/C home tomorrow. Able to increase amb distance and theraex this afternoon. Reports pain is more well controlled today. Will need to address stair goal next therapy session.   Follow Up Recommendations  Home health PT;Supervision/Assistance - 24 hour     Does the patient have the potential to tolerate intense rehabilitation     Barriers to Discharge        Equipment Recommendations  None recommended by PT    Recommendations for Other Services    Frequency 7X/week   Progress towards PT Goals Progress towards PT goals: Progressing toward goals  Plan Current plan remains appropriate    Precautions / Restrictions Precautions Precautions: Posterior Hip;Fall Precaution Booklet Issued: Yes (comment) Precaution Comments: pt able to recall 3/3 hip precautions  Restrictions Weight Bearing Restrictions: Yes LLE Weight Bearing: Weight bearing as tolerated   Pertinent Vitals/Pain 3-4/10; pt premedicated with IV pain medicine by RN prior to session    Mobility  Bed Mobility Bed Mobility: Supine to Sit Supine to Sit: HOB flat;With rails;4: Min assist Details for Bed Mobility Assistance: pt requries increased time and hand rails; min (A) to advance L LE to EOB; states "my wife will help me, i dont need to be able to do that"  Transfers Transfers: Sit to Stand;Stand to Sit Sit to Stand: 5: Supervision;From bed Stand to Sit: 5: Supervision;To chair/3-in-1;With armrests Details for Transfer Assistance: no physical (A) required; supervision for safety and min cues for hand placement and to maintain hip precautions  Ambulation/Gait Ambulation/Gait Assistance: 5:  Supervision Ambulation Distance (Feet): 200 Feet Assistive device: Rolling walker Ambulation/Gait Assistance Details: min cues for gt sequencing to increase step length on L LE and heel strike; pt progressing to step through gt more consistently; cues to maintain hip precautions with turns Gait Pattern: Step-to pattern;Step-through pattern;Decreased stance time - left;Decreased step length - right Gait velocity: decreased due to pain Stairs: No Wheelchair Mobility Wheelchair Mobility: No    Exercises Total Joint Exercises Ankle Circles/Pumps: 10 reps;Both Quad Sets: Strengthening;Both;10 reps Gluteal Sets: 10 reps;Seated Heel Slides: AAROM;Left;10 reps;Seated Hip ABduction/ADduction: AAROM;Left;10 reps;Seated Long Arc Quad: AROM;10 reps;Left   PT Diagnosis:    PT Problem List:   PT Treatment Interventions:     PT Goals (current goals can now be found in the care plan section) Acute Rehab PT Goals Patient Stated Goal: to do better on this hip  PT Goal Formulation: With patient Time For Goal Achievement: 08/15/12 Potential to Achieve Goals: Good  Visit Information  Last PT Received On: 08/09/12 Assistance Needed: +1 History of Present Illness: s/p posterior left THA    Subjective Data  Subjective: "ive been waiting on you. i need the good stuff then we can roll"  Patient Stated Goal: to do better on this hip    Cognition  Cognition Arousal/Alertness: Awake/alert Behavior During Therapy: WFL for tasks assessed/performed Overall Cognitive Status: Within Functional Limits for tasks assessed    Balance  Balance Balance Assessed: No  End of Session PT - End of Session Equipment Utilized During Treatment: Gait belt Activity Tolerance: Patient tolerated treatment well Patient left: in chair;with call bell/phone within reach Nurse Communication: Mobility status   GP  [name], [name] [phone] 08/09/2012, 3:11 PM

## 2012-08-09 NOTE — Progress Notes (Signed)
Physical Therapy Treatment Patient Details Name: Frederick Hall MRN: [id number] DOB: 1940/10/31 Today's Date: 08/09/2012 Time: 4098-1191 PT Time Calculation (min): 24 min  PT Assessment / Plan / Recommendation  History of Present Illness s/p posterior left THA   PT Comments   Pt progressing well with therapy. Pt stated he had excruciating pain last night and did not sleep well. Was able to increase amb distance and theraex today. Will cont to f/u with pt to maximize functional mobility prior to D/C home. Pt hopes to D/C home tomorrow.   Follow Up Recommendations  Home health PT;Supervision/Assistance - 24 hour     Does the patient have the potential to tolerate intense rehabilitation     Barriers to Discharge        Equipment Recommendations  None recommended by PT    Recommendations for Other Services    Frequency 7X/week   Progress towards PT Goals Progress towards PT goals: Progressing toward goals  Plan Current plan remains appropriate    Precautions / Restrictions Precautions Precautions: Posterior Hip;Fall Precaution Booklet Issued: Yes (comment) Precaution Comments: pt able to recall 3/3 hip precautions  Restrictions Weight Bearing Restrictions: Yes LLE Weight Bearing: Weight bearing as tolerated   Pertinent Vitals/Pain 7/10; given ice pack for pain/edema     Mobility  Bed Mobility Bed Mobility: Not assessed Transfers Transfers: Sit to Stand;Stand to Sit Sit to Stand: 5: Supervision;With armrests;From chair/3-in-1 Stand to Sit: 4: Min assist;To chair/3-in-1;With armrests Details for Transfer Assistance: pt requires (A) to maintain hip precautions when transferring from stand to sit; supervision for safety and min cues for hand placement  Ambulation/Gait Ambulation/Gait Assistance: 5: Supervision Ambulation Distance (Feet): 150 Feet Assistive device: Rolling walker Ambulation/Gait Assistance Details: pt required 2 standing rest breaks due to pain/fatigued;  supervision for safety and vc's to maintain hip precautions with turns and not IR L LE; mod cues for gt sequencing and to progress to step through gt Gait Pattern: Step-to pattern;Step-through pattern;Decreased stance time - left;Decreased step length - right Gait velocity: decreased due to pain Stairs: No Wheelchair Mobility Wheelchair Mobility: No    Exercises Total Joint Exercises Ankle Circles/Pumps: 10 reps;Both Quad Sets: Strengthening;Both;10 reps Gluteal Sets: 10 reps;Seated Hip ABduction/ADduction: AAROM;Left;10 reps;Seated Long Arc Quad: AROM;10 reps;Left   PT Diagnosis:    PT Problem List:   PT Treatment Interventions:     PT Goals (current goals can now be found in the care plan section) Acute Rehab PT Goals Patient Stated Goal: to do better on this hip  PT Goal Formulation: With patient/family Time For Goal Achievement: 08/15/12 Potential to Achieve Goals: Good  Visit Information  Last PT Received On: 08/09/12 Assistance Needed: +1 History of Present Illness: s/p posterior left THA    Subjective Data  Subjective: "im not doing as good with this hip as i was with the right hip.  Patient Stated Goal: to do better on this hip    Cognition  Cognition Arousal/Alertness: Awake/alert Behavior During Therapy: WFL for tasks assessed/performed Overall Cognitive Status: Within Functional Limits for tasks assessed    Balance  Balance Balance Assessed: Yes Static Standing Balance Static Standing - Balance Support: No upper extremity supported;During functional activity Static Standing - Level of Assistance: 5: Stand by assistance  End of Session PT - End of Session Equipment Utilized During Treatment: Gait belt Activity Tolerance: Patient tolerated treatment well Patient left: in chair;with call bell/phone within reach Nurse Communication: Mobility status   GP     [place], [place]  N, PT [phone] 08/09/2012, 9:47 AM

## 2012-08-10 LAB — CBC
HCT: 27.4 % — ABNORMAL LOW (ref 39.0–52.0)
Hemoglobin: 9.5 g/dL — ABNORMAL LOW (ref 13.0–17.0)
MCH: 31.7 pg (ref 26.0–34.0)
MCHC: 34.7 g/dL (ref 30.0–36.0)
RBC: 3 MIL/uL — ABNORMAL LOW (ref 4.22–5.81)

## 2012-08-10 NOTE — Progress Notes (Signed)
Occupational Therapy Treatment Patient Details Name: Frederick Hall MRN: [id number] DOB: Feb 14, 1940 Today's Date: 08/10/2012 Time: 4098-1191 OT Time Calculation (min): 12 min  OT Assessment / Plan / Recommendation  History of present illness s/p posterior left THA   OT comments  Pt declined OOB activity wth OT this a.m. Pt and wife stated that he walked with PT this morning and wife helped him in bathroom with ADLs. Pt agreeable to education and demo of tub bench. PT to return this afternoon and pt may d/c home depending on progress  Follow Up Recommendations  Home health OT;Supervision/Assistance - 24 hour    Barriers to Discharge   None    Equipment Recommendations   tub bench, most likely will not purchase per pt and his wife   Recommendations for Other Services    Frequency Min 2X/week   Progress towards OT Goals    Plan Discharge plan remains appropriate    Precautions / Restrictions Precautions Precautions: Posterior Hip;Fall Precaution Comments: pt able to recall 3/3 hip precautions independently  Restrictions Weight Bearing Restrictions: Yes LLE Weight Bearing: Weight bearing as tolerated   Pertinent Vitals/Pain     ADL  ADL Comments: pt in bed upon entering room and declined tub shower transfer practice, agreeable to education and demo from OT. Pt and his wife stated that they most likey will conitune with sponge baths until he is well enough to step into shower again and not interested in tub bench    OT Diagnosis:    OT Problem List:   OT Treatment Interventions:     OT Goals(current goals can now be found in the care plan section) Acute Rehab OT Goals Patient Stated Goal: to get more range of motion in this hip  Visit Information  Last OT Received On: 08/10/12 Assistance Needed: +1 History of Present Illness: s/p posterior left THA    Subjective Data      Prior Functioning       Cognition  Cognition Arousal/Alertness: Awake/alert Behavior  During Therapy: WFL for tasks assessed/performed Overall Cognitive Status: Within Functional Limits for tasks assessed    Mobility  Bed Mobility Bed Mobility: Not assessed Details for Bed Mobility Assistance: pt sitting in chair; returned to chair Transfers Transfers: Not assessed Sit to Stand: 5: Supervision;From chair/3-in-1 Stand to Sit: 5: Supervision;To chair/3-in-1 Details for Transfer Assistance: min vc's for hand placement and safety; pt able to independently advance L LE and maintain hip precautions with stand to sit; no physical (A) required        Balance Balance Balance Assessed: No   End of Session    GO     Frederick Hall 08/10/2012, 11:02 AM

## 2012-08-10 NOTE — Progress Notes (Signed)
Patient ID: Frederick Hall, male   DOB: 02-22-1940, 72 y.o.   MRN: [id number]     Subjective:  Patient reports pain as mild to moderate.  Patient still having pain.  No CP or SOB  Objective:   VITALS:   Filed Vitals:   08/09/12 0544 08/09/12 1335 08/09/12 2004 08/10/12 0649  BP: 100/54 92/54 106/65 106/63  Pulse: 87 92 97 85  Temp: 100.1 F (37.8 C) 98.7 F (37.1 C) 98.5 F (36.9 C) 99.2 F (37.3 C)  TempSrc:   Oral Oral  Resp: 16 18 16 16   SpO2: 97% 98% 97% 94%    ABD soft Sensation intact distally Dorsiflexion/Plantar flexion intact Incision: dressing C/D/I and no drainage   Lab Results  Component Value Date   WBC 9.2 08/10/2012   HGB 9.5* 08/10/2012   HCT 27.4* 08/10/2012   MCV 91.3 08/10/2012   PLT 155 08/10/2012     Assessment/Plan: 3 Days Post-Op   Principal Problem:   Osteoarthritis of left hip   Advance diet Up with therapy Patient may need to be in house one more day but will see how well he does with PT  ADDENDUM:  Still struggling with pain control.  Will keep one more night and get a little extra PT in before discharge Friday.   Haskel Khan 08/10/2012, 7:13 AM   Teryl Lucy, MD Cell [phone]

## 2012-08-10 NOTE — Progress Notes (Signed)
Physical Therapy Treatment Patient Details Name: Frederick Hall MRN: [id number] DOB: 1940-06-17 Today's Date: 08/10/2012 Time: 3086-5784 PT Time Calculation (min): 25 min  PT Assessment / Plan / Recommendation  History of Present Illness s/p posterior left THA   PT Comments   Pt progressing well with therapy. Is supervision for mobility and transfers. Pt concerned about wife assisting him at home and with his bed mobility. Will see pt again today for family education and to review bed mobility techniques. Pt should be ready for D/C from mobility standpoint after second session today.   Follow Up Recommendations  Home health PT;Supervision/Assistance - 24 hour     Does the patient have the potential to tolerate intense rehabilitation     Barriers to Discharge        Equipment Recommendations  None recommended by PT    Recommendations for Other Services    Frequency 7X/week   Progress towards PT Goals Progress towards PT goals: Progressing toward goals  Plan Current plan remains appropriate    Precautions / Restrictions Precautions Precautions: Posterior Hip;Fall Precaution Comments: pt able to recall 2/3 hip precautions independently  Restrictions Weight Bearing Restrictions: Yes LLE Weight Bearing: Weight bearing as tolerated   Pertinent Vitals/Pain 5-6/10; pt premedicated     Mobility  Bed Mobility Bed Mobility: Not assessed Details for Bed Mobility Assistance: pt sitting in chair; returned to chair Transfers Transfers: Sit to Stand;Stand to Sit Sit to Stand: 5: Supervision;From chair/3-in-1 Stand to Sit: 5: Supervision;To chair/3-in-1 Details for Transfer Assistance: min vc's for hand placement and safety; pt able to independently advance L LE and maintain hip precautions with stand to sit; no physical (A) required  Ambulation/Gait Ambulation/Gait Assistance: 5: Supervision Ambulation Distance (Feet): 200 Feet Assistive device: Rolling walker Ambulation/Gait  Assistance Details: supervision for safety and cues for gt sequencing and RW safety; vc's for upright postre, to maintain hip precautions and to increase heel strike on bil LEs Gait Pattern: Step-to pattern;Step-through pattern;Decreased stance time - left;Decreased step length - right Gait velocity: decreased due to pain Stairs: Yes Stairs Assistance: 5: Supervision Stairs Assistance Details (indicate cue type and reason): vc's for gt sequencing; supervision for safety Stair Management Technique: One rail Right;Forwards;Step to pattern Number of Stairs: 5 Wheelchair Mobility Wheelchair Mobility: No    Exercises Total Joint Exercises Ankle Circles/Pumps: 10 reps;Both Quad Sets: Left;10 reps;Seated Gluteal Sets: 10 reps;Seated Heel Slides: AAROM;Left;10 reps;Seated Hip ABduction/ADduction: AAROM;Left;10 reps;Seated Long Arc Quad: AROM;10 reps;Left Knee Flexion: AROM;Left;10 reps;Standing Marching in Standing: AROM;Both;10 reps;Standing   PT Diagnosis:    PT Problem List:   PT Treatment Interventions:     PT Goals (current goals can now be found in the care plan section) Acute Rehab PT Goals Patient Stated Goal: to get more range of motion in this hip PT Goal Formulation: With patient Time For Goal Achievement: 08/15/12 Potential to Achieve Goals: Good  Visit Information  Last PT Received On: 08/10/12 Assistance Needed: +1 History of Present Illness: s/p posterior left THA    Subjective Data  Subjective: pt sitting in the chair; "i want you to tell me the truth, if you think im ready to go or not"  Patient Stated Goal: to get more range of motion in this hip   Cognition  Cognition Arousal/Alertness: Awake/alert Behavior During Therapy: WFL for tasks assessed/performed Overall Cognitive Status: Within Functional Limits for tasks assessed    Balance  Balance Balance Assessed: No  End of Session PT - End of Session Activity Tolerance:  Patient tolerated treatment  well Patient left: in chair;with call bell/phone within reach Nurse Communication: Mobility status   GP     [name], [place] [phone] 08/10/2012, 9:03 AM

## 2012-08-10 NOTE — Progress Notes (Signed)
Physical Therapy Treatment Patient Details Name: Frederick Hall MRN: [id number] DOB: July 05, 1940 Today's Date: 08/10/2012 Time: 4098-1191 PT Time Calculation (min): 11 min  PT Assessment / Plan / Recommendation  History of Present Illness s/p posterior left THA   PT Comments   Pt limited in therapy this afternoon due to pain. Agreeable to bed exercises only. Hopes to D/C tomorrow. Will cont to f/u with pt to maximize functional mobility.  Follow Up Recommendations  Home health PT;Supervision/Assistance - 24 hour     Does the patient have the potential to tolerate intense rehabilitation     Barriers to Discharge        Equipment Recommendations  None recommended by PT    Recommendations for Other Services    Frequency 7X/week   Progress towards PT Goals Progress towards PT goals: Progressing toward goals  Plan Current plan remains appropriate    Precautions / Restrictions Precautions Precautions: Posterior Hip;Fall Precaution Comments: pt recalled 2/3 hip precautions independently  Restrictions Weight Bearing Restrictions: Yes LLE Weight Bearing: Weight bearing as tolerated   Pertinent Vitals/Pain 7/10; pt premedicated    Mobility  Bed Mobility Bed Mobility: Not assessed Transfers Transfers: Not assessed Ambulation/Gait Ambulation/Gait Assistance: Not tested (comment) Stairs: No Wheelchair Mobility Wheelchair Mobility: No    Exercises Total Joint Exercises Ankle Circles/Pumps: 10 reps;Both Quad Sets: Left;10 reps;Seated Gluteal Sets: 10 reps;Seated Short Arc Quad: AROM;Left;10 reps;Supine Heel Slides: AAROM;Left;10 reps;Seated Hip ABduction/ADduction: AAROM;Left;10 reps;Seated   PT Diagnosis:    PT Problem List:   PT Treatment Interventions:     PT Goals (current goals can now be found in the care plan section) Acute Rehab PT Goals Patient Stated Goal: to get this twitching to stop and get home tomorrow  PT Goal Formulation: With patient Time For Goal  Achievement: 08/15/12 Potential to Achieve Goals: Good  Visit Information  Last PT Received On: 08/10/12 Assistance Needed: +1 History of Present Illness: s/p posterior left THA    Subjective Data  Subjective: pt lying supine; states "i just dont think we should walk, not the way my leg is twitching." agreeable to exercises  Patient Stated Goal: to get this twitching to stop and get home tomorrow    Cognition  Cognition Arousal/Alertness: Awake/alert Behavior During Therapy: WFL for tasks assessed/performed Overall Cognitive Status: Within Functional Limits for tasks assessed    Balance  Balance Balance Assessed: No  End of Session PT - End of Session Activity Tolerance: Patient limited by pain  Patient left: in bed;with call bell/phone within reach Nurse Communication: Mobility status   GP     [name], [place] [phone] 08/10/2012, 3:30 PM

## 2012-08-11 NOTE — Discharge Summary (Signed)
Physician Discharge Summary  Patient ID: Frederick Hall MRN: [id number] DOB/AGE: 02/10/1940 72 y.o.  Admit date: 08/07/2012 Discharge date: 08/11/2012  Admission Diagnoses:  Osteoarthritis of left hip  Discharge Diagnoses:  Principal Problem:   Osteoarthritis of left hip   Past Medical History  Diagnosis Date  . Hypertension   . Asthma 2005    "attack x 1"  . GERD (gastroesophageal reflux disease)   . Arthritis   . Osteoarthritis of right hip 05/23/2012  . Cancer     skin  . Osteoarthritis of left hip 08/07/2012    Surgeries: Procedure(s): TOTAL HIP ARTHROPLASTY- left  on 08/07/2012   Consultants (if any):    Discharged Condition: Improved  Hospital Course: Frederick Hall is an 72 y.o. male who was admitted 08/07/2012 with a diagnosis of Osteoarthritis of left hip and went to the operating room on 08/07/2012 and underwent the above named procedures.    He was given perioperative antibiotics:  Anti-infectives   Start     Dose/Rate Route Frequency Ordered Stop   08/07/12 2000  vancomycin (VANCOCIN) IVPB 1000 mg/200 mL premix     1,000 mg 200 mL/hr over 60 Minutes Intravenous Every 12 hours 08/07/12 1848 08/07/12 2106   08/07/12 0600  vancomycin (VANCOCIN) IVPB 1000 mg/200 mL premix     1,000 mg 200 mL/hr over 60 Minutes Intravenous On call to O.R. 08/06/12 1248 08/07/12 1350    .  He was given sequential compression devices, early ambulation, and lovenox for DVT prophylaxis.  He benefited maximally from the hospital stay and there were no complications.    Recent vital signs:  Filed Vitals:   08/11/12 0441  BP: 110/66  Pulse: 80  Temp: 98.9 F (37.2 C)  Resp: 16    Recent laboratory studies:  Lab Results  Component Value Date   HGB 9.5* 08/10/2012   HGB 9.8* 08/09/2012   HGB 11.6* 08/08/2012   Lab Results  Component Value Date   WBC 9.2 08/10/2012   PLT 155 08/10/2012   Lab Results  Component Value Date   INR 0.93 08/07/2012   Lab Results   Component Value Date   NA 126* 08/09/2012   K 3.9 08/09/2012   CL 93* 08/09/2012   CO2 23 08/09/2012   BUN 14 08/09/2012   CREATININE 0.99 08/09/2012   GLUCOSE 115* 08/09/2012    Discharge Medications:     Medication List         bisacodyl 5 MG EC tablet  Commonly known as:  DULCOLAX  Take 1 tablet (5 mg total) by mouth daily as needed for constipation.     calcium carbonate 600 MG Tabs  Commonly known as:  OS-CAL  Take 600 mg by mouth daily.     lisinopril 20 MG tablet  Commonly known as:  PRINIVIL,ZESTRIL  Take 20 mg by mouth every morning.     methocarbamol 500 MG tablet  Commonly known as:  ROBAXIN  Take 1 tablet (500 mg total) by mouth 4 (four) times daily.     multivitamin with minerals Tabs  Take 1 tablet by mouth daily.     oxyCODONE-acetaminophen 10-325 MG per tablet  Commonly known as:  PERCOCET  Take 1-2 tablets by mouth every 6 (six) hours as needed for pain. MAXIMUM TOTAL ACETAMINOPHEN DOSE IS 4000 MG PER DAY     promethazine 25 MG tablet  Commonly known as:  PHENERGAN  Take 1 tablet (25 mg total) by mouth every 6 (six) hours as  needed for nausea.     ranitidine 150 MG tablet  Commonly known as:  ZANTAC  Take 150 mg by mouth every morning.     rivaroxaban 10 MG Tabs tablet  Commonly known as:  XARELTO  Take 1 tablet (10 mg total) by mouth daily.     sennosides-docusate sodium 8.6-50 MG tablet  Commonly known as:  SENOKOT-S  Take 1 tablet by mouth daily as needed for constipation.        Diagnostic Studies: Dg Pelvis Portable  08/07/2012   *RADIOLOGY REPORT*  Clinical Data: Postop left hip replacement.  PORTABLE PELVIS  Comparison: 05/23/2012  Findings: Remote changes of right hip replacement.  New left hip replacement.  No hardware or bony complicating feature.  Normal AP alignment.  IMPRESSION: Left hip replacement without complicating feature.   Original Report Authenticated By: Charlett Nose, M.D.   Dg Hip Portable 1 View Left  08/07/2012    *RADIOLOGY REPORT*  Clinical Data: Postop left hip arthroplasty.  PORTABLE LEFT HIP - 1 VIEW 08/07/2012 1708 hours:  Comparison: Portable AP pelvis x-ray obtained concurrently at 1656 hours.  Findings: Left hip arthroplasty with anatomic alignment.  No acute complicating features.  IMPRESSION: Anatomic alignment post left hip arthroplasty without acute complicating features.   Original Report Authenticated By: Hulan Saas, M.D.    Disposition: 06-Home-Health Care Svc      Discharge Orders   Future Orders Complete By Expires     Call MD / Call 911  As directed     Comments:      If you experience chest pain or shortness of breath, CALL 911 and be transported to the hospital emergency room.  If you develope a fever above 101 F, pus (white drainage) or increased drainage or redness at the wound, or calf pain, call your surgeon's office.    Change dressing  As directed     Comments:      You may change your dressing in 3 days, then change the dressing daily with sterile 4 x 4 inch gauze dressing and paper tape.  You may clean the incision with alcohol prior to redressing    Constipation Prevention  As directed     Comments:      Drink plenty of fluids.  Prune juice may be helpful.  You may use a stool softener, such as Colace (over the counter) 100 mg twice a day.  Use MiraLax (over the counter) for constipation as needed.    Diet general  As directed     Discharge instructions  As directed     Comments:      Change dressing in 3 days and reapply fresh dressing, unless you have a splint (half cast).  If you have a splint/cast, just leave in place until your follow-up appointment.    Keep wounds dry for 3 weeks.  Leave steri-strips in place on skin.  Do not apply lotion or anything to the wound.    Follow the hip precautions as taught in Physical Therapy  As directed     Posterior total hip precautions  As directed     TED hose  As directed     Comments:      Use stockings (TED hose) for 2  weeks on both leg(s).  You may remove them at night for sleeping.    Weight bearing as tolerated  As directed        Follow-up Information   Follow up with Eulas Post, MD. Schedule an  appointment as soon as possible for a visit in 2 weeks.   Contact information:   [address] [place] [address] [phone]        Signed: Eulas Post 08/11/2012, 8:53 AM

## 2012-08-11 NOTE — Progress Notes (Signed)
Patient ID: Frederick Hall, male   DOB: July 12, 1940, 72 y.o.   MRN: [id number]     Subjective:  Patient reports pain as mild.  He states that he is much better and is able to get up by himself and is ready to get home  Objective:   VITALS:   Filed Vitals:   08/10/12 0649 08/10/12 1338 08/10/12 2203 08/11/12 0441  BP: 106/63 90/61 100/61 110/66  Pulse: 85 89 84 80  Temp: 99.2 F (37.3 C) 98.1 F (36.7 C) 99.2 F (37.3 C) 98.9 F (37.2 C)  TempSrc: Oral   Oral  Resp: 16 17 16 16   SpO2: 94% 98% 94% 98%    ABD soft Sensation intact distally Dorsiflexion/Plantar flexion intact Incision: dressing C/D/I and no drainage   Lab Results  Component Value Date   WBC 9.2 08/10/2012   HGB 9.5* 08/10/2012   HCT 27.4* 08/10/2012   MCV 91.3 08/10/2012   PLT 155 08/10/2012     Assessment/Plan: 4 Days Post-Op   Principal Problem:   Osteoarthritis of left hip   Advance diet Up with therapy Discharge home with home health WBAT Dry dressing PRN    Haskel Khan 08/11/2012, 7:58 AM   Teryl Lucy, MD Cell [phone]

## 2012-08-11 NOTE — Progress Notes (Signed)
Physical Therapy Treatment Patient Details Name: Frederick Hall MRN: [id number] DOB: 1940-09-27 Today's Date: 08/11/2012 Time: 0825-0850 PT Time Calculation (min): 25 min  PT Assessment / Plan / Recommendation  History of Present Illness s/p posterior left THA   PT Comments   Pt reports pain is much better today and well controled. Reviewed HEP with pt thoroughly and instructed pt to use beach towel or belt for hip abduction exercise due to lack of ROM and strength. Pt demo good technique with exercises. Pt able to increase amb distance and is at mod i for mobility. Requires supervision (A) with steps for cues and safety. Pt is ready from mobility standpoint to D/C home with wife and HHPT. All education and and goals met at this time.   Follow Up Recommendations  Home health PT;Supervision/Assistance - 24 hour     Does the patient have the potential to tolerate intense rehabilitation     Barriers to Discharge        Equipment Recommendations  None recommended by PT    Recommendations for Other Services    Frequency 7X/week   Progress towards PT Goals Progress towards PT goals: Progressing toward goals  Plan Current plan remains appropriate    Precautions / Restrictions Precautions Precautions: Posterior Hip Precaution Comments: pt able to independently recall 1/3 hip precautions  Restrictions Weight Bearing Restrictions: Yes LLE Weight Bearing: Weight bearing as tolerated   Pertinent Vitals/Pain 4/10; pt premedicated.    Mobility  Bed Mobility Bed Mobility: Not assessed Transfers Transfers: Sit to Stand;Stand to Sit Sit to Stand: 6: Modified independent (Device/Increase time);From chair/3-in-1;From bed Stand to Sit: 6: Modified independent (Device/Increase time);To chair/3-in-1;With armrests Details for Transfer Assistance: pt demo good technique; able to demo ability to maintain hip precautions. no physical (A) needed   Ambulation/Gait Ambulation/Gait Assistance: 6:  Modified independent (Device/Increase time);5: Supervision Ambulation Distance (Feet): 400 Feet Assistive device: Rolling walker Ambulation/Gait Assistance Details: pt able to maintain step through gt today; min cues to maintain hip precautions with turns and keep RW on ground when turning  Gait Pattern: Step-through pattern;Decreased trunk rotation Gait velocity: decreased  Stairs: Yes Stairs Assistance: 5: Supervision Stairs Assistance Details (indicate cue type and reason): min vc's for gt sequencing and supervision for safety  Stair Management Technique: One rail Right;Forwards;Step to pattern Number of Stairs: 5 Wheelchair Mobility Wheelchair Mobility: No    Exercises Total Joint Exercises Ankle Circles/Pumps: 10 reps;Both Quad Sets: Left;10 reps;Seated Gluteal Sets: 10 reps;Seated Heel Slides: AAROM;Left;10 reps;Seated Hip ABduction/ADduction: AAROM;Left;10 reps;Seated Long Arc Quad: AROM;10 reps;Left   PT Diagnosis:    PT Problem List:   PT Treatment Interventions:     PT Goals (current goals can now be found in the care plan section) Acute Rehab PT Goals Patient Stated Goal: to go home today PT Goal Formulation: With patient Time For Goal Achievement: 08/15/12 Potential to Achieve Goals: Good  Visit Information  Last PT Received On: 08/11/12 Assistance Needed: +1 History of Present Illness: s/p posterior left THA    Subjective Data  Subjective: pt sitting in chair; "oh good you came to wake me up. i need to go to the bathroom then we can tackle the steps again"  Patient Stated Goal: to go home today   Cognition  Cognition Arousal/Alertness: Awake/alert Behavior During Therapy: WFL for tasks assessed/performed Overall Cognitive Status: Within Functional Limits for tasks assessed    Balance  Balance Balance Assessed: No  End of Session PT - End of Session Activity Tolerance:  Patient tolerated treatment well Patient left: in chair;with call bell/phone within  reach Nurse Communication: Mobility status   GP     [name], [place] [phone] 08/11/2012, 8:58 AM

## 2013-07-27 DIAGNOSIS — D099 Carcinoma in situ, unspecified: Secondary | ICD-10-CM

## 2013-07-27 HISTORY — DX: Carcinoma in situ, unspecified: D09.9

## 2014-02-07 ENCOUNTER — Encounter: Payer: Self-pay | Admitting: Diagnostic Neuroimaging

## 2014-02-07 ENCOUNTER — Other Ambulatory Visit: Payer: Self-pay | Admitting: *Deleted

## 2014-02-07 ENCOUNTER — Telehealth: Payer: Self-pay | Admitting: *Deleted

## 2014-02-07 ENCOUNTER — Ambulatory Visit (INDEPENDENT_AMBULATORY_CARE_PROVIDER_SITE_OTHER): Payer: Medicare Other | Admitting: Diagnostic Neuroimaging

## 2014-02-07 VITALS — BP 152/92 | HR 61 | Temp 96.7°F | Ht 69.0 in | Wt 192.2 lb

## 2014-02-07 DIAGNOSIS — G4485 Primary stabbing headache: Secondary | ICD-10-CM

## 2014-02-07 NOTE — Progress Notes (Signed)
[facility]  PATIENT: Frederick Hall DOB: January 22, 1940  REFERRING CLINICIAN: Tillman Sers HISTORY FROM: patient and wife  REASON FOR VISIT: new consult    HISTORICAL  CHIEF COMPLAINT:  Chief Complaint  Patient presents with  . New Evaluation    stabbing headaches above the right ear that have been going on since last week and last a second at a time     HISTORY OF PRESENT ILLNESS:   74 year old left-handed male with hypertension here for evaluation of right-sided headaches. Last Saturday patient developed onset of right parietal, round stabbing pain, lasting 1 second at a time, repeating every 10 seconds 4-5 times. The cycle would repeat every 1-2 minutes. Symptoms increased on Sunday. On Monday patient saw his PCP. These attacks would range from mild discomfort to severe debilitating pain. Patient received steroid injection, hydrocodone prescription, indomethacin prescription. Patient felt better on Monday evening. She is due morning the symptoms recurred. On Wednesday patient took first dose of indomethacin, and this seemed to help. Patient has been taking naproxen every 8-12 hours with good response. Last attack was this morning at 7 AM.   No associated, triggering or alleviating factors. No nausea, vomiting, photophobia, phonophobia, numbness, tingling, weakness. No recent accidents, trauma, infection. No similar pain or headache in the past.  Of note patient has multiple lipomas throughout his body, some of which have been resected in the past. Patient notes a small lipoma lesion in the same location as the current pain in the right parietal region.   REVIEW OF SYSTEMS: Full 14 system review of systems performed and notable only for headache, hearing loss.  ALLERGIES: Allergies  Allergen Reactions  . Penicillins Anaphylaxis    Childhood allergy. Tolerated cefazolin 05/23/12.     HOME MEDICATIONS: Prior to Admission medications   Medication Sig Start Date  End Date Taking? Authorizing Provider  aspirin 81 MG tablet Take 81 mg by mouth daily.   Yes Historical Provider, MD  indomethacin (INDOCIN) 50 MG capsule Take 50 mg by mouth 3 (three) times daily.   Yes Historical Provider, MD  lisinopril (PRINIVIL,ZESTRIL) 20 MG tablet Take 20 mg by mouth every morning.   Yes Historical Provider, MD  ranitidine (ZANTAC) 150 MG tablet Take 150 mg by mouth every morning.   Yes Historical Provider, MD  oxyCODONE-acetaminophen (PERCOCET/ROXICET) 5-325 MG per tablet Take 1-2 tablets by mouth every 6 (six) hours as needed for severe pain.    Historical Provider, MD     PAST MEDICAL HISTORY: Past Medical History  Diagnosis Date  . Hypertension   . Asthma 2005    "attack x 1"  . GERD (gastroesophageal reflux disease)   . Arthritis   . Osteoarthritis of right hip 05/23/2012  . Cancer     skin  . Osteoarthritis of left hip 08/07/2012    PAST SURGICAL HISTORY: Past Surgical History  Procedure Laterality Date  . Knee arthroscopy Left   . Shoulder arthroscopy Right   . Lipoma excision      multiple  . Total hip arthroplasty Right 05/23/2012    Procedure: TOTAL HIP ARTHROPLASTY;  Surgeon: Johnny Bridge, MD;  Location: [facility];  Service: Orthopedics;  Laterality: Right;  . Tonsillectomy    . Total hip arthroplasty Left 08/07/2012    Procedure: TOTAL HIP ARTHROPLASTY- left ;  Surgeon: Johnny Bridge, MD;  Location: [place];  Service: Orthopedics;  Laterality: Left;    FAMILY HISTORY: Family History  Problem Relation Age of Onset  .  Cancer Mother   . Cancer Father     SOCIAL HISTORY:  History   Social History  . Marital Status: Married    Spouse Name: [name]    Number of Children: N/A  . Years of Education: college   Occupational History  . retired    Social History Main Topics  . Smoking status: Former Smoker -- 1.00 packs/day for 15 years    Types: Cigarettes    Quit date: 05/18/1972  . Smokeless tobacco: Never Used     Comment: social  alcohol  . Alcohol Use: 4.2 oz/week    7 Not specified per week     Comment: 3  wine or beer  . Drug Use: No  . Sexual Activity: Not on file   Other Topics Concern  . Not on file   Social History Narrative   Lives at home with his wife    Drink 2-3 cups of coffee a day     PHYSICAL EXAM  Filed Vitals:   02/07/14 1258  BP: 152/92  Pulse: 61  Temp: 96.7 F (35.9 C)  TempSrc: Oral  Height: 5\' 9"  (1.753 m)  Weight: 192 lb 3.2 oz (87.181 kg)    Body mass index is 28.37 kg/(m^2).   Visual Acuity Screening   Right eye Left eye Both eyes  Without correction:     With correction: 20/20 20/20     No flowsheet data found.  GENERAL EXAM: Patient is in no distress; well developed, nourished and groomed; neck is supple; SMALL LIPOMA IN RIGHT PARIETAL SCALP REGION, IN SAME LOCATION OF THE STABBING HEADACHE PAINS.  CARDIOVASCULAR: Regular rate and rhythm, no murmurs, no carotid bruits  NEUROLOGIC: MENTAL STATUS: awake, alert, oriented to person, place and time, recent and remote memory intact, normal attention and concentration, language fluent, comprehension intact, naming intact, fund of knowledge appropriate CRANIAL NERVE: no papilledema on fundoscopic exam, pupils equal and reactive to light, visual fields full to confrontation, extraocular muscles intact, no nystagmus, facial sensation and strength symmetric, hearing intact, palate elevates symmetrically, uvula midline, shoulder shrug symmetric, tongue midline. MOTOR: normal bulk and tone, full strength in the BUE, BLE SENSORY: normal and symmetric to light touch, temperature, vibration  COORDINATION: finger-nose-finger, fine finger movements normal REFLEXES: deep tendon reflexes present and symmetric GAIT/STATION: narrow based gait; able to walk tandem; romberg is negative    DIAGNOSTIC DATA (LABS, IMAGING, TESTING) - I reviewed patient records, labs, notes, testing and imaging myself where available.  Lab Results    Component Value Date   WBC 9.2 08/10/2012   HGB 9.5* 08/10/2012   HCT 27.4* 08/10/2012   MCV 91.3 08/10/2012   PLT 155 08/10/2012      Component Value Date/Time   NA 126* 08/09/2012 0600   K 3.9 08/09/2012 0600   CL 93* 08/09/2012 0600   CO2 23 08/09/2012 0600   GLUCOSE 115* 08/09/2012 0600   BUN 14 08/09/2012 0600   CREATININE 0.99 08/09/2012 0600   CALCIUM 8.6 08/09/2012 0600   GFRNONAA 81* 08/09/2012 0600   GFRAA >90 08/09/2012 0600   No results found for: CHOL, HDL, LDLCALC, LDLDIRECT, TRIG, CHOLHDL No results found for: HGBA1C No results found for: VITAMINB12 No results found for: TSH     ASSESSMENT AND PLAN  74 y.o. year old male here with new onset right parietal stabbing, intermittent, severe headaches. Patient has small lipoma near this region. Could represent primary stabbing headache, nummular headache, or other secondary headache. Patient seems to have moderate  response to indomethacin.   PLAN: - We'll check MRI of the brain (DUE TO NEW ONSET OF SYMPTOMS) and continue indomethacin. He may use hydrocodone as needed for breakthrough headache/pain.  Orders Placed This Encounter  Procedures  . MR Brain W Wo Contrast   Return in about 6 weeks (around 03/21/2014).    Penni Bombard, MD 2/67/1245, 8:09 PM Certified in Neurology, Neurophysiology and Neuroimaging  [facility] [address] [place], [place] [address] [phone]

## 2014-02-07 NOTE — Patient Instructions (Signed)
Continue indomethacin.  Use hydrocodone/tylenol as needed.

## 2014-02-07 NOTE — Telephone Encounter (Signed)
Spoke with the pt on the phone, verified that he was not taking the xarelto. Removed it from his medication list and reviewed it. Notified Dr. Leta Baptist of correction.

## 2014-02-08 ENCOUNTER — Other Ambulatory Visit: Payer: Self-pay | Admitting: Diagnostic Neuroimaging

## 2014-02-08 ENCOUNTER — Ambulatory Visit
Admission: RE | Admit: 2014-02-08 | Discharge: 2014-02-08 | Disposition: A | Discharge status: Home / Self Care | Payer: Medicare Other | Source: Ambulatory Visit | Attending: Diagnostic Neuroimaging | Admitting: Diagnostic Neuroimaging

## 2014-02-08 DIAGNOSIS — Z139 Encounter for screening, unspecified: Secondary | ICD-10-CM

## 2014-02-08 DIAGNOSIS — D179 Benign lipomatous neoplasm, unspecified: Secondary | ICD-10-CM

## 2014-02-08 DIAGNOSIS — G4485 Primary stabbing headache: Secondary | ICD-10-CM

## 2014-02-08 HISTORY — DX: Benign lipomatous neoplasm, unspecified: D17.9

## 2014-02-08 MED ORDER — GADOBENATE DIMEGLUMINE 529 MG/ML IV SOLN
18.0000 mL | Freq: Once | INTRAVENOUS | Status: AC | PRN
  Administered 2014-02-08: 18 mL via INTRAVENOUS

## 2014-02-13 ENCOUNTER — Telehealth: Payer: Self-pay | Admitting: Diagnostic Neuroimaging

## 2014-02-13 NOTE — Telephone Encounter (Signed)
Patient requesting MRI results.  Please call and advise.

## 2014-02-13 NOTE — Telephone Encounter (Signed)
I called patient. Gave MRI results. Nothing significant. Doing well with indomethacin and tylenol. Headaches have resolved.   Penni Bombard, MD 07/11/2456, 0:99 PM Certified in Neurology, Neurophysiology and Neuroimaging  [facility] [address] [place], [place] [address] [phone]

## 2014-03-25 ENCOUNTER — Ambulatory Visit: Payer: Medicare Other | Admitting: Diagnostic Neuroimaging

## 2014-04-19 IMAGING — CR DG PORTABLE PELVIS
1 series · 1 of 1 positions shown · non-contrast
Comparison: 05/23/2012

CLINICAL DATA: Postop left hip replacement.

PORTABLE PELVIS

[AP]
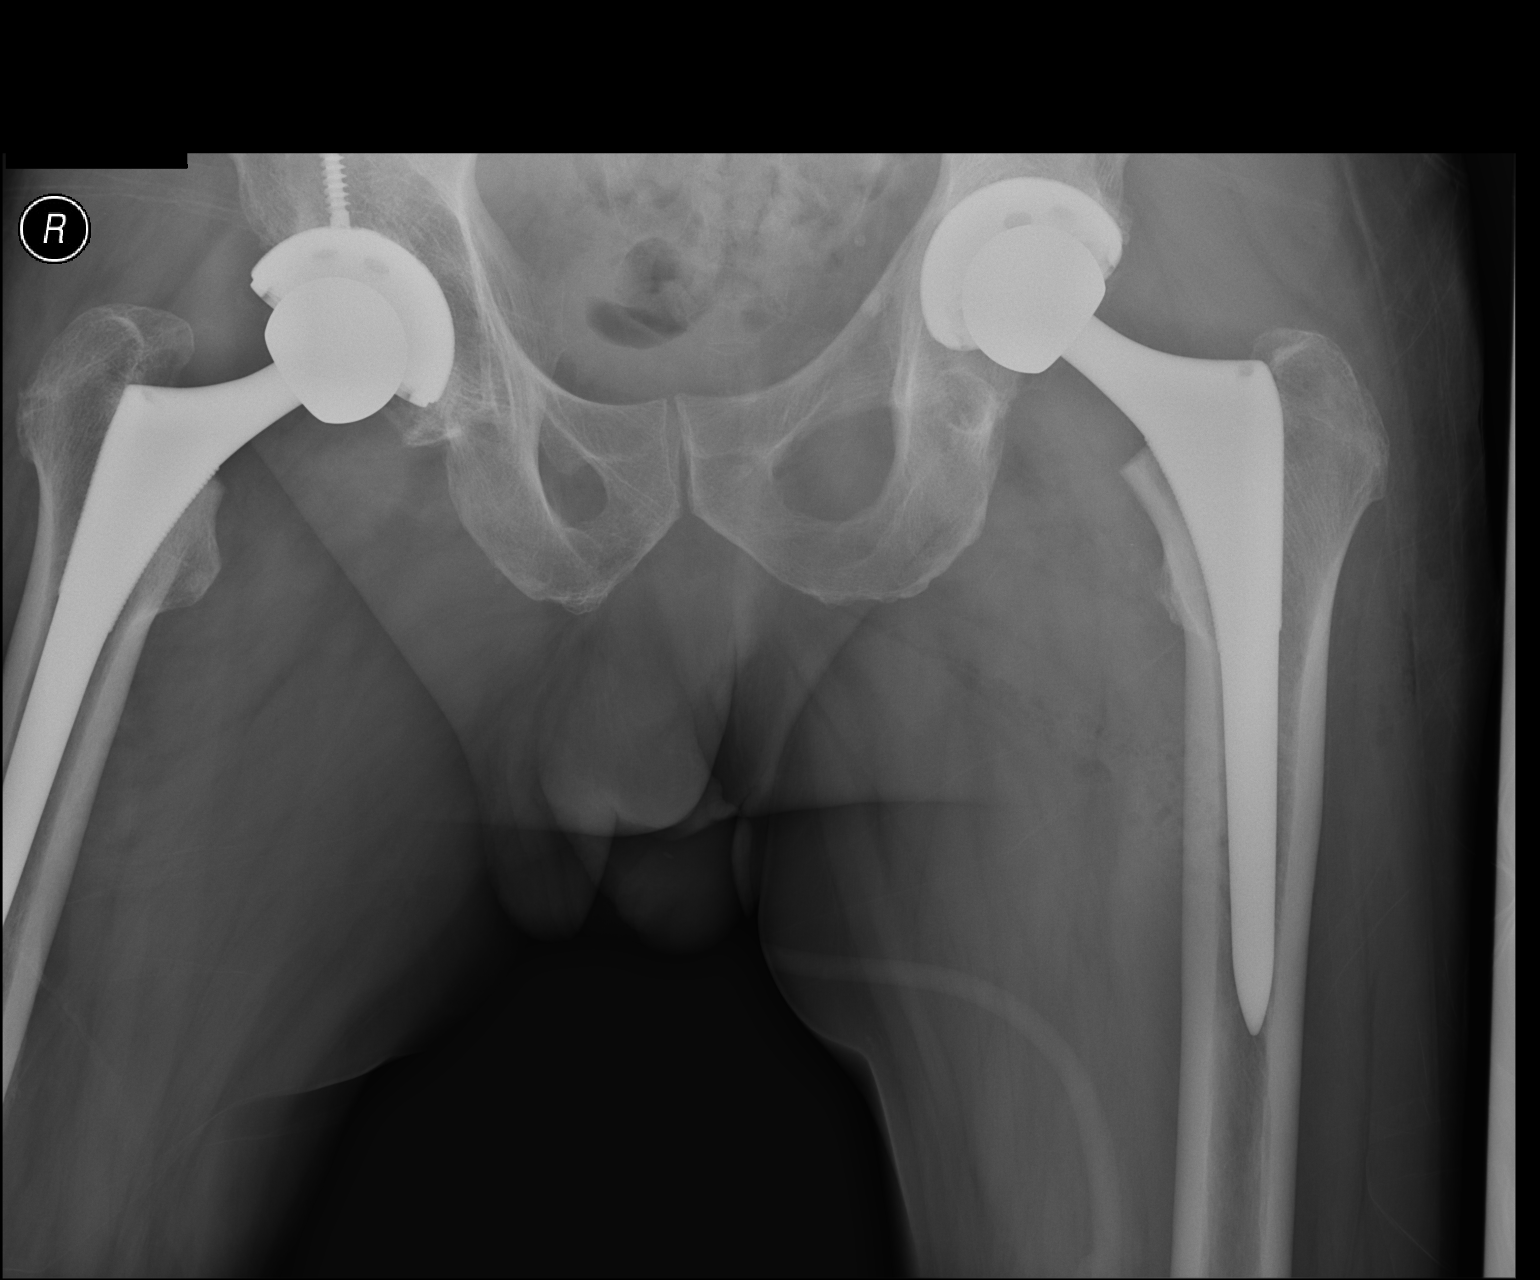

[1 of 1 positions shown; findings below may reference images not displayed]

FINDINGS: Remote changes of right hip replacement.  New left hip
replacement.  No hardware or bony complicating feature.  Normal AP
alignment.
IMPRESSION: Left hip replacement without complicating feature.

## 2014-04-19 IMAGING — CR DG HIP 1V PORT*L*
1 series · 1 of 1 positions shown · non-contrast
Comparison: Portable AP pelvis x-ray obtained concurrently at 2313
hours.

CLINICAL DATA: Postop left hip arthroplasty.

PORTABLE LEFT HIP - 1 VIEW [DATE]/2199 9017 hours:

[xtable lateral]
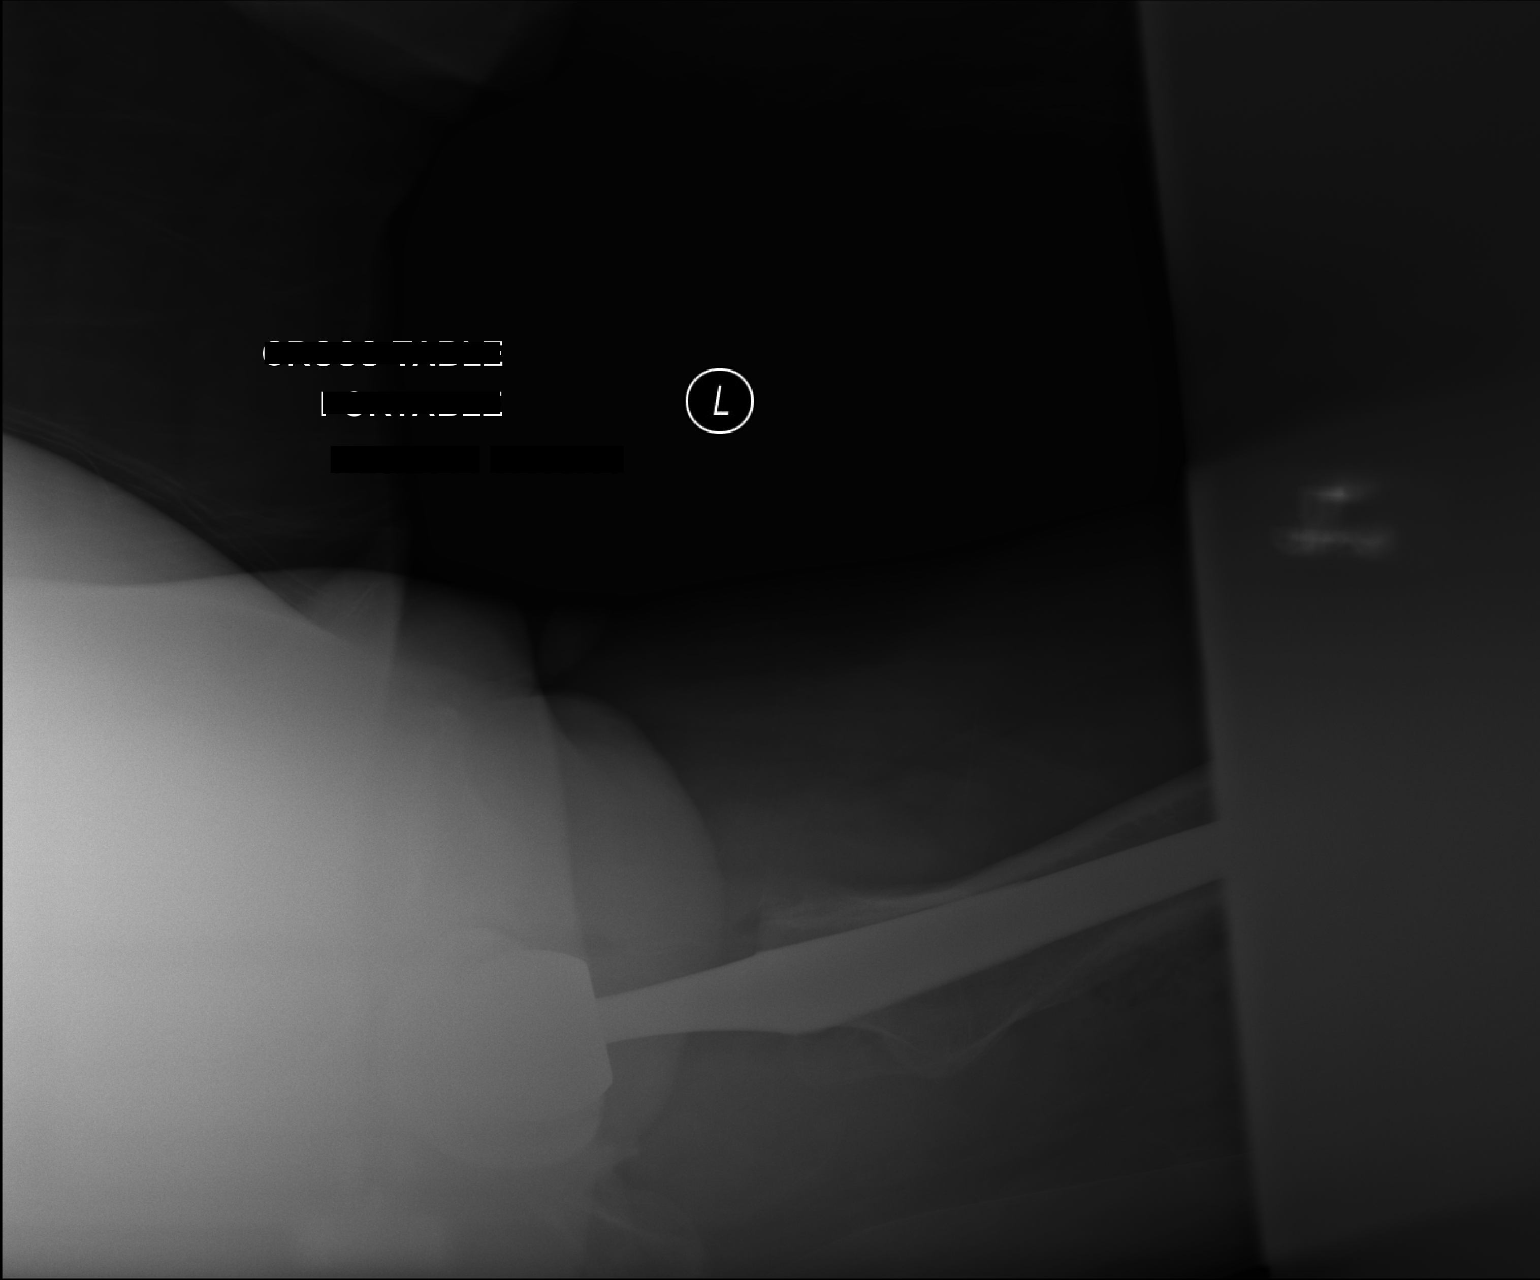

[1 of 1 positions shown; findings below may reference images not displayed]

FINDINGS: Left hip arthroplasty with anatomic alignment.  No acute
complicating features.
IMPRESSION: Anatomic alignment post left hip arthroplasty without acute
complicating features.

## 2014-10-14 DIAGNOSIS — C4491 Basal cell carcinoma of skin, unspecified: Secondary | ICD-10-CM

## 2014-10-14 HISTORY — DX: Basal cell carcinoma of skin, unspecified: C44.91

## 2016-07-20 DIAGNOSIS — C4491 Basal cell carcinoma of skin, unspecified: Secondary | ICD-10-CM

## 2016-07-20 HISTORY — DX: Basal cell carcinoma of skin, unspecified: C44.91

## 2016-10-21 DIAGNOSIS — C4491 Basal cell carcinoma of skin, unspecified: Secondary | ICD-10-CM

## 2016-10-21 HISTORY — DX: Basal cell carcinoma of skin, unspecified: C44.91

## 2016-12-11 DIAGNOSIS — R319 Hematuria, unspecified: Secondary | ICD-10-CM

## 2016-12-11 DIAGNOSIS — Z87438 Personal history of other diseases of male genital organs: Secondary | ICD-10-CM

## 2016-12-11 HISTORY — DX: Hematuria, unspecified: R31.9

## 2016-12-11 HISTORY — DX: Personal history of other diseases of male genital organs: Z87.438

## 2017-06-15 ENCOUNTER — Other Ambulatory Visit: Payer: Self-pay | Admitting: Urology

## 2017-06-15 DIAGNOSIS — C61 Malignant neoplasm of prostate: Secondary | ICD-10-CM

## 2017-06-24 ENCOUNTER — Encounter: Payer: Self-pay | Admitting: Radiation Oncology

## 2017-06-27 ENCOUNTER — Encounter (HOSPITAL_COMMUNITY)
Admission: RE | Admit: 2017-06-27 | Discharge: 2017-06-27 | Disposition: A | Discharge status: Home / Self Care | Payer: Medicare Other | Source: Ambulatory Visit | Attending: Urology | Admitting: Urology

## 2017-06-27 DIAGNOSIS — C61 Malignant neoplasm of prostate: Secondary | ICD-10-CM | POA: Insufficient documentation

## 2017-06-27 DIAGNOSIS — N281 Cyst of kidney, acquired: Secondary | ICD-10-CM

## 2017-06-27 DIAGNOSIS — R918 Other nonspecific abnormal finding of lung field: Secondary | ICD-10-CM | POA: Insufficient documentation

## 2017-06-27 HISTORY — DX: Cyst of kidney, acquired: N28.1

## 2017-06-27 MED ORDER — TECHNETIUM TC 99M MEDRONATE IV KIT
21.7000 | PACK | Freq: Once | INTRAVENOUS | Status: AC | PRN
  Administered 2017-06-27: 21.7 via INTRAVENOUS

## 2017-06-30 ENCOUNTER — Encounter: Payer: Self-pay | Admitting: Radiation Oncology

## 2017-07-12 ENCOUNTER — Encounter: Payer: Self-pay | Admitting: Radiation Oncology

## 2017-07-12 NOTE — Progress Notes (Signed)
GU Location of Tumor / Histology: prostatic adenocarcinoma  If Prostate Cancer, Gleason Score is (4 + 4) and PSA is (7.94) on 03/16/2017. Prostate volume: 32.53 grams.   03/16/2017 PSA  7.94 12/31/2016 PSA  8.18 05/06/2015 PSA  4.22 01/19/2013 PSA  3.26  Frederick Hall was referred by his PCP, Dr. Kathryne Eriksson, to Dr. Diona Fanti in April 2019 for further evaluation of an elevated PSA.  Biopsies of prostate (if applicable) revealed:    Past/Anticipated interventions by urology, if any: multiple prostate biopsies, bone scan, referral for consideration of radiation therapy  Past/Anticipated interventions by medical oncology, if any: no  Weight changes, if any: no  Bowel/Bladder complaints, if any: IPSS 8. SHIM 19 with aid of Cialis. Denies dysuria, hematuria, urinary leakage or incontinence.   Nausea/Vomiting, if any: no  Pain issues, if any:  no  SAFETY ISSUES:  Prior radiation? no  Pacemaker/ICD? no  Possible current pregnancy? no  Is the patient on methotrexate? no  Current Complaints / other details:  77 year old male. Married. Retired. Resides in [place].  Mother with history of lung ca that metastasized to her brain. Mother was a smoker. Maternal aunt died of lung ca as well and was a smoker too. Father with hx of liposarcoma.

## 2017-07-13 ENCOUNTER — Ambulatory Visit
Admission: RE | Admit: 2017-07-13 | Discharge: 2017-07-13 | Disposition: A | Discharge status: Home / Self Care | Payer: Medicare Other | Source: Ambulatory Visit | Attending: Radiation Oncology | Admitting: Radiation Oncology

## 2017-07-13 ENCOUNTER — Telehealth: Payer: Self-pay | Admitting: *Deleted

## 2017-07-13 ENCOUNTER — Encounter: Payer: Self-pay | Admitting: Radiation Oncology

## 2017-07-13 ENCOUNTER — Other Ambulatory Visit: Payer: Self-pay

## 2017-07-13 ENCOUNTER — Inpatient Hospital Stay
Admission: RE | Admit: 2017-07-13 | Discharge: 2017-07-13 | Disposition: A | Discharge status: Home / Self Care | Payer: PRIVATE HEALTH INSURANCE | Source: Ambulatory Visit | Attending: Radiation Oncology | Admitting: Radiation Oncology

## 2017-07-13 VITALS — BP 130/84 | HR 58 | Temp 98.4°F | Resp 18 | Wt 184.0 lb

## 2017-07-13 VITALS — BP 138/86 | HR 58 | Temp 98.4°F | Resp 18 | Wt 184.0 lb

## 2017-07-13 DIAGNOSIS — I10 Essential (primary) hypertension: Secondary | ICD-10-CM | POA: Diagnosis not present

## 2017-07-13 DIAGNOSIS — K219 Gastro-esophageal reflux disease without esophagitis: Secondary | ICD-10-CM | POA: Insufficient documentation

## 2017-07-13 DIAGNOSIS — C61 Malignant neoplasm of prostate: Secondary | ICD-10-CM

## 2017-07-13 DIAGNOSIS — J45909 Unspecified asthma, uncomplicated: Secondary | ICD-10-CM | POA: Diagnosis not present

## 2017-07-13 DIAGNOSIS — Z96643 Presence of artificial hip joint, bilateral: Secondary | ICD-10-CM | POA: Diagnosis not present

## 2017-07-13 DIAGNOSIS — Z79899 Other long term (current) drug therapy: Secondary | ICD-10-CM | POA: Diagnosis not present

## 2017-07-13 DIAGNOSIS — Z87891 Personal history of nicotine dependence: Secondary | ICD-10-CM | POA: Diagnosis not present

## 2017-07-13 DIAGNOSIS — Z801 Family history of malignant neoplasm of trachea, bronchus and lung: Secondary | ICD-10-CM | POA: Diagnosis not present

## 2017-07-13 DIAGNOSIS — Z85828 Personal history of other malignant neoplasm of skin: Secondary | ICD-10-CM | POA: Diagnosis not present

## 2017-07-13 HISTORY — DX: Unspecified malignant neoplasm of skin, unspecified: C44.90

## 2017-07-13 HISTORY — DX: Malignant neoplasm of prostate: C61

## 2017-07-13 NOTE — Telephone Encounter (Signed)
CALLED PATIENT TO INFORM OF APPT. FOR HORMONE INJ. ON 07/27/17 @ TIME OF CONSULT WITH DR. DAHLSTEDT, LVM FOR A RETURN CALL

## 2017-07-13 NOTE — Progress Notes (Signed)
See progress note under physician encounter. 

## 2017-07-13 NOTE — Progress Notes (Signed)
Radiation Oncology         [phone] ________________________________  Initial Outpatient Consultation  Name: Frederick Hall MRN: [id number]  Date: 07/13/2017  DOB: July 06, 1940  CC:Frederick [place], MD  Franchot Gallo, MD   REFERRING PHYSICIAN: Franchot Gallo, MD  DIAGNOSIS: 77 y.o. gentleman with high risk, Stage T1c adenocarcinoma of the prostate with Gleason Score of 4+4, and PSA of 7.94    ICD-10-CM   1. Malignant neoplasm of prostate ([place]) C61     HISTORY OF PRESENT ILLNESS: Frederick Hall is a 77 y.o. male with a diagnosis of prostate cancer. He was noted to have an elevated PSA of 7.94 on 03/16/2017, 8.18 on 12/31/2016, 4.22 on 05/06/2015 and 3.26 on 01/19/2013 by his primary care physician, Dr. Redmond Pulling, Jama Flavors, MD.  Accordingly, he was referred for evaluation in urology by Dr. Franchot Gallo, MD on 05/04/2017,  digital rectal examination was performed at that time revealing no nodules.  The patient proceeded to transrectal ultrasound with 12 biopsies of the prostate on 06/08/2017.  The prostate volume measured 40 gram.  Out of 12 core biopsies,7 were positive.  The maximum Gleason score was 4+4=8, and this was seen in [place].    The patient reviewed the biopsy results with his urologist and he has kindly been referred today for discussion of potential radiation treatment options.  On 06/27/2017 he underwent a NM Bone Scan Whole Body showing no definite scintigraphic evidence of osseous metastatic disease. Focal uptake right maxilla favor sinus disease origin. Bowel hip protheses. Large peripelvic right renal cyst.   He is accompanied by his wife. The patient reports nocturia x2. The patient is due for a colonoscopy. His mother has a history of lung cancer that metastasized to her brain. His maternal aunt died from lung cancer and his father has a history of liposarcoma. He denies dysuria, hematuria, urinary leakage, incontinence, nausea, vomiting, pain or any other  symptoms or complaints at this time.   PREVIOUS RADIATION THERAPY: No  PAST MEDICAL HISTORY:  Past Medical History:  Diagnosis Date  . Arthritis   . Asthma 2005   "attack x 1"  . GERD (gastroesophageal reflux disease)   . Hypertension   . Osteoarthritis of left hip 08/07/2012  . Osteoarthritis of right hip 05/23/2012  . Prostate cancer ([place])   . Skin cancer       PAST SURGICAL HISTORY: Past Surgical History:  Procedure Laterality Date  . KNEE ARTHROSCOPY Left   . LIPOMA EXCISION     multiple  . SHOULDER ARTHROSCOPY Right   . TONSILLECTOMY    . TOTAL HIP ARTHROPLASTY Right 05/23/2012   Procedure: TOTAL HIP ARTHROPLASTY;  Surgeon: Johnny Bridge, MD;  Location: [facility];  Service: Orthopedics;  Laterality: Right;  . TOTAL HIP ARTHROPLASTY Left 08/07/2012   Procedure: TOTAL HIP ARTHROPLASTY- left ;  Surgeon: Johnny Bridge, MD;  Location: [facility];  Service: Orthopedics;  Laterality: Left;    FAMILY HISTORY:  Family History  Problem Relation Age of Onset  . Cancer Mother   . Cancer Father     SOCIAL HISTORY:  Social History   Socioeconomic History  . Marital status: Married    Spouse name: [name]  . Number of children: Not on file  . Years of education: college  . Highest education level: Not on file  Occupational History  . Occupation: retired  Scientific laboratory technician  . Financial resource strain: Not on file  . Food insecurity:    Worry:  Not on file    Inability: Not on file  . Transportation needs:    Medical: Not on file    Non-medical: Not on file  Tobacco Use  . Smoking status: Former Smoker    Packs/day: 1.00    Years: 15.00    Pack years: 15.00    Types: Cigarettes    Last attempt to quit: 05/18/1972    Years since quitting: 45.1  . Smokeless tobacco: Never Used  . Tobacco comment: social alcohol  Substance and Sexual Activity  . Alcohol use: Yes    Alcohol/week: 4.2 oz    Types: 7 Standard drinks or equivalent per week    Comment: 3  wine or beer  . Drug use:  No  . Sexual activity: Yes  Lifestyle  . Physical activity:    Days per week: Not on file    Minutes per session: Not on file  . Stress: Not on file  Relationships  . Social connections:    Talks on phone: Not on file    Gets together: Not on file    Attends religious service: Not on file    Active member of club or organization: Not on file    Attends meetings of clubs or organizations: Not on file    Relationship status: Not on file  . Intimate partner violence:    Fear of current or ex partner: Not on file    Emotionally abused: Not on file    Physically abused: Not on file    Forced sexual activity: Not on file  Other Topics Concern  . Not on file  Social History Narrative   Lives at home with his wife    Drink 2-3 cups of coffee a day    ALLERGIES: Penicillins and Cefuroxime axetil  MEDICATIONS:  Current Outpatient Medications  Medication Sig Dispense Refill  . lisinopril (PRINIVIL,ZESTRIL) 20 MG tablet Take 20 mg by mouth every morning.    . ranitidine (ZANTAC) 150 MG tablet Take 150 mg by mouth every morning.    . tadalafil (ADCIRCA/CIALIS) 20 MG tablet Take as directed for E.D.     No current facility-administered medications for this encounter.     REVIEW OF SYSTEMS:  On review of systems, the patient reports that he is doing well overall. He denies any chest pain, shortness of breath, cough, fevers, chills, night sweats, unintended weight changes. He denies any bowel disturbances, and denies abdominal pain, nausea or vomiting. He denies any new musculoskeletal or joint aches or pains. His IPSS was 8, indicating moderate urinary symptoms. He is able to complete sexual activity with most attempts. A complete review of systems is obtained and is otherwise negative.    PHYSICAL EXAM:  Wt Readings from Last 3 Encounters:  07/13/17 184 lb (83.5 kg)  07/13/17 184 lb (83.5 kg)  02/07/14 192 lb 3.2 oz (87.2 kg)   Temp Readings from Last 3 Encounters:  07/13/17 98.4  F (36.9 C)  07/13/17 98.4 F (36.9 C) (Oral)  02/07/14 (!) 96.7 F (35.9 C) (Oral)   BP Readings from Last 3 Encounters:  07/13/17 130/84  07/13/17 138/86  02/07/14 (!) 152/92   Pulse Readings from Last 3 Encounters:  07/13/17 (!) 58  07/13/17 (!) 58  02/07/14 61   Pain Assessment Pain Score: 0-No pain/10  In general this is a well appearing Caucasian male in no acute distress. He is alert and oriented x4 and appropriate throughout the examination. HEENT reveals that the patient is normocephalic,  atraumatic. EOMs are intact. PERRLA. Skin is intact without any evidence of gross lesions. Cardiovascular exam reveals a regular rate and rhythm, no clicks rubs or murmurs are auscultated. Chest is clear to auscultation bilaterally. Lymphatic assessment is performed and does not reveal any adenopathy in the cervical, supraclavicular, axillary, or inguinal chains. Abdomen has active bowel sounds in all quadrants and is intact. The abdomen is soft, non tender, non distended. Lower extremities are negative for pretibial pitting edema, deep calf tenderness, cyanosis or clubbing.   KPS = 100  100 - Normal; no complaints; no evidence of disease. 90   - Able to carry on normal activity; minor signs or symptoms of disease. 80   - Normal activity with effort; some signs or symptoms of disease. 49   - Cares for self; unable to carry on normal activity or to do active work. 60   - Requires occasional assistance, but is able to care for most of his personal needs. 50   - Requires considerable assistance and frequent medical care. 40   - Disabled; requires special care and assistance. 39   - Severely disabled; hospital admission is indicated although death not imminent. 25   - Very sick; hospital admission necessary; active supportive treatment necessary. 10   - Moribund; fatal processes progressing rapidly. 0     - Dead  Karnofsky DA, Abelmann [place], Craver LS and Burchenal [facility] [phone]) The use of the  nitrogen mustards in the palliative treatment of carcinoma: with particular reference to bronchogenic carcinoma Cancer 1 [id number]  LABORATORY DATA:  Lab Results  Component Value Date   WBC 9.2 08/10/2012   HGB 9.5 (L) 08/10/2012   HCT 27.4 (L) 08/10/2012   MCV 91.3 08/10/2012   PLT 155 08/10/2012   Lab Results  Component Value Date   NA 126 (L) 08/09/2012   K 3.9 08/09/2012   CL 93 (L) 08/09/2012   CO2 23 08/09/2012   No results found for: ALT, AST, GGT, ALKPHOS, BILITOT   RADIOGRAPHY: Nm Bone Scan Whole Body  Result Date: 06/27/2017 CLINICAL DATA:  Prostate cancer, PSA = 8.07 EXAM: NUCLEAR MEDICINE WHOLE BODY BONE SCAN TECHNIQUE: Whole body anterior and posterior images were obtained approximately 3 hours after intravenous injection of radiopharmaceutical. RADIOPHARMACEUTICALS:  21.7 mCi Technetium-26m MDP IV COMPARISON:  None Radiographic correlation: CT abdomen and pelvis 06/27/2017 FINDINGS: Increased uptake at the shoulders, wrists, knees and feet typically degenerative. Focal increased tracer localization at the RIGHT maxilla, suspect due to sinus disease. Photopenic defects at both hips from BILATERAL hip prostheses. No additional abnormal osseous tracer accumulation identified. Displacement of the tracer in the RIGHT kidney laterally; by prior CT exam this is due to a large peripelvic cyst of the RIGHT kidney displacing renal parenchyma laterally. Remaining urinary tract and soft tissue distribution of tracer unremarkable. IMPRESSION: No definite scintigraphic evidence of osseous metastatic disease. Focal uptake RIGHT maxilla favor sinus disease origin. Bowel hip prostheses. Large peripelvic RIGHT renal cyst Electronically Signed   By: Lavonia Dana M.D.   On: 06/27/2017 15:25      IMPRESSION/PLAN: 1. 77 y.o. gentleman with high risk, Stage T1c adenocarcinoma of the prostate with Gleason Score of 4+4, and PSA of 7.94. I discussed the patient's workup and outlines the nature of prostate  cancer in this setting. The patient's Gleason's score puts him into the high risk group. Accordingly, he is eligible for a variety of potential treatment options including LT-ADT with 8 weeks of external radiation or 5 weeks of  external radiation plus a brachytherapy boost. We discussed the available radiation techniques, and focused on the details and logistics and delivery. We discussed and outlined the risks, benefits, short and long-term effects associated with radiotherapy and compared and contrasted these with prostatectomy. We discussed the role of SpaceOAR in reducing the rectal toxicity associated with radiotherapy.   2. Pt will see Dr. Franchot Gallo, MD as scheduled in about 2 weeks.  He would like to proceed with - LT-ADT now - Up-Front Seed implant boost with SpaceOAR in approximately 2 months after ADT start - 5 weeks of external radiation to the pelvis   _____________________________________  Sheral Apley. Tammi Klippel, M.D.   This document serves as a record of services personally performed by Tyler Pita, MD. It was created on his behalf by Margit Banda, a trained medical scribe. The creation of this record is based on the scribe's personal observations and the provider's statements to them. This document has been checked and approved by the attending provider.

## 2017-07-14 DIAGNOSIS — C61 Malignant neoplasm of prostate: Secondary | ICD-10-CM | POA: Insufficient documentation

## 2017-07-14 NOTE — Addendum Note (Signed)
Encounter addended by: Tyler Pita, MD on: 07/14/2017 5:08 PM  Actions taken: LOS modified

## 2017-08-02 ENCOUNTER — Other Ambulatory Visit: Payer: Self-pay | Admitting: Urology

## 2017-08-03 ENCOUNTER — Telehealth: Payer: Self-pay | Admitting: *Deleted

## 2017-08-03 NOTE — Telephone Encounter (Signed)
Called patient to inform of pre-seed planning CT and his implant, lvm for a return call

## 2017-08-25 ENCOUNTER — Telehealth: Payer: Self-pay | Admitting: *Deleted

## 2017-08-25 NOTE — Telephone Encounter (Signed)
CALLED PATIENT TO REMIND OF PRE-SEED APPTS. AND CHEST X-RAY AND EKG FOR 08-26-17, LVM FOR A RETURN CALL

## 2017-08-25 NOTE — Progress Notes (Signed)
  Radiation Oncology         [phone] ________________________________  Name: Frederick Hall MRN: [id number]  Date: 08/26/2017  DOB: [birth year]  SIMULATION AND TREATMENT PLANNING NOTE PUBIC ARCH STUDY  HU:OHFGBM, Jama Flavors, MD  Franchot Gallo, MD  DIAGNOSIS: 77 y.o. gentleman with high risk, Stage T1c adenocarcinoma of the prostate with Gleason Score of 4+4, and PSA of 7.94     ICD-10-CM   1. Malignant neoplasm of prostate ([place]) C61     COMPLEX SIMULATION:  The patient presented today for evaluation for possible prostate seed implant. He was brought to the radiation planning suite and placed supine on the CT couch. A 3-dimensional image study set was obtained in upload to the planning computer. There, on each axial slice, I contoured the prostate gland. Then, using three-dimensional radiation planning tools I reconstructed the prostate in view of the structures from the transperineal needle pathway to assess for possible pubic arch interference. In doing so, I did not appreciate any pubic arch interference. Also, the patient's prostate volume was estimated based on the drawn structure. The volume was 40 cc.  Given the pubic arch appearance and prostate volume, patient remains a good candidate to proceed with prostate seed implant. Today, he freely provided informed written consent to proceed.    PLAN: The patient will undergo prostate seed implant boost to 110 Gy to be followed by 45 Gy IMRT.   ________________________________  Sheral Apley. Tammi Klippel, M.D.

## 2017-08-26 ENCOUNTER — Encounter: Payer: Self-pay | Admitting: Urology

## 2017-08-26 ENCOUNTER — Ambulatory Visit
Admission: RE | Admit: 2017-08-26 | Discharge: 2017-08-26 | Disposition: A | Discharge status: Home / Self Care | Payer: Medicare Other | Source: Ambulatory Visit | Attending: Radiation Oncology | Admitting: Radiation Oncology

## 2017-08-26 ENCOUNTER — Encounter (HOSPITAL_COMMUNITY)
Admission: RE | Admit: 2017-08-26 | Discharge: 2017-08-26 | Disposition: A | Discharge status: Home / Self Care | Payer: Medicare Other | Source: Ambulatory Visit | Attending: Urology | Admitting: Urology

## 2017-08-26 ENCOUNTER — Encounter: Payer: Self-pay | Admitting: Medical Oncology

## 2017-08-26 ENCOUNTER — Ambulatory Visit (HOSPITAL_COMMUNITY)
Admission: RE | Admit: 2017-08-26 | Discharge: 2017-08-26 | Disposition: A | Discharge status: Home / Self Care | Payer: Medicare Other | Source: Ambulatory Visit | Attending: Urology | Admitting: Urology

## 2017-08-26 DIAGNOSIS — C61 Malignant neoplasm of prostate: Secondary | ICD-10-CM | POA: Diagnosis present

## 2017-08-26 DIAGNOSIS — Z01818 Encounter for other preprocedural examination: Secondary | ICD-10-CM | POA: Insufficient documentation

## 2017-08-26 DIAGNOSIS — R911 Solitary pulmonary nodule: Secondary | ICD-10-CM

## 2017-08-26 HISTORY — DX: Solitary pulmonary nodule: R91.1

## 2017-08-30 ENCOUNTER — Encounter: Payer: Self-pay | Admitting: *Deleted

## 2017-08-30 ENCOUNTER — Institutional Professional Consult (permissible substitution) (INDEPENDENT_AMBULATORY_CARE_PROVIDER_SITE_OTHER): Payer: Medicare Other | Admitting: Cardiothoracic Surgery

## 2017-08-30 VITALS — BP 114/84 | HR 59 | Resp 20 | Ht 69.0 in | Wt 182.0 lb

## 2017-08-30 DIAGNOSIS — R911 Solitary pulmonary nodule: Secondary | ICD-10-CM

## 2017-08-30 NOTE — Progress Notes (Signed)
[address]       [place] [address]             [phone]                    Jamale S Schwer Perry Medical Record #[id number] Date of Birth: Sep 16, 1940  Referring: Franchot Gallo, MD Primary Care: [name], MD Primary Cardiologist: No primary care provider on file.  Chief Complaint:    Chief Complaint  Patient presents with  . Lung Lesion    Surgical eval, Chest CT 08/04/17    History of Present Illness:    CANON GOLA 77 y.o. male is seen in the office  today for evaluation of groundglass opacity in the right lower lung.  The patient is a distant smoker having smoked for 10 to 12 years but he stopped smoking in 1976. The patient was recently diagnosed with prostate cancer, CT of the abdomen was performed indicating a groundglass opacity 1.4 to 1.5 cm in size in the right lower lobe, to follow-up on this a CT scan of the chest was performed.  The patient was referred by Dr. Diona Fanti.  Patient denies any respiratory symptoms, denies hemoptysis.  He has no significant occupational respiratory exposures that he is aware of.    Current Activity/ Functional Status:  Patient is independent with mobility/ambulation, transfers, ADL's, IADL's.   Zubrod Score: At the time of surgery this patient's most appropriate activity status/level should be described as: [x]     0    Normal activity, no symptoms []     1    Restricted in physical strenuous activity but ambulatory, able to do out light work []     2    Ambulatory and capable of self care, unable to do work activities, up and about               >50 % of waking hours                              []     3    Only limited self care, in bed greater than 50% of waking hours []     4    Completely disabled, no self care, confined to bed or chair []     5    Moribund   Past Medical History:  Diagnosis Date  . Arthritis   . Asthma 2005   "attack x 1"  . GERD (gastroesophageal reflux  disease)   . Hypertension   . Osteoarthritis of left hip 08/07/2012  . Osteoarthritis of right hip 05/23/2012  . Prostate cancer ([facility])   . Skin cancer     Past Surgical History:  Procedure Laterality Date  . KNEE ARTHROSCOPY Left   . LIPOMA EXCISION     multiple  . SHOULDER ARTHROSCOPY Right   . TONSILLECTOMY    . TOTAL HIP ARTHROPLASTY Right 05/23/2012   Procedure: TOTAL HIP ARTHROPLASTY;  Surgeon: Johnny Bridge, MD;  Location: [facility];  Service: Orthopedics;  Laterality: Right;  . TOTAL HIP ARTHROPLASTY Left 08/07/2012   Procedure: TOTAL HIP ARTHROPLASTY- left ;  Surgeon: Johnny Bridge, MD;  Location: [facility];  Service: Orthopedics;  Laterality: Left;    Family History  Problem Relation Age of Onset  . Cancer Mother   . Cancer Father      Social History   Tobacco Use  Smoking Status  Former Smoker  . Packs/day: 1.00  . Years: 15.00  . Pack years: 15.00  . Types: Cigarettes  . Last attempt to quit: 05/18/1972  . Years since quitting: 45.3  Smokeless Tobacco Never Used  Tobacco Comment   social alcohol    Social History   Substance and Sexual Activity  Alcohol Use Yes  . Alcohol/week: 7.0 standard drinks  . Types: 7 Standard drinks or equivalent per week   Comment: 3  wine or beer     Allergies  Allergen Reactions  . Penicillins Anaphylaxis    Childhood allergy. Tolerated cefazolin 05/23/12.   . Cefuroxime Axetil Other (See Comments)    um um     Current Outpatient Medications  Medication Sig Dispense Refill  . lisinopril (PRINIVIL,ZESTRIL) 20 MG tablet Take 20 mg by mouth every morning.    . Multiple Vitamin (MULTIVITAMIN) tablet Take 1 tablet by mouth daily.    . ranitidine (ZANTAC) 150 MG tablet Take 150 mg by mouth every morning.    . saw palmetto 500 MG capsule Take 500 mg by mouth daily.    . tadalafil (ADCIRCA/CIALIS) 20 MG tablet Take as directed for E.D.     No current facility-administered medications for this visit.     Pertinent items are  noted in HPI.   Review of Systems:     Cardiac Review of Systems: [Y] = yes  or   [ N ] = no   Chest Pain [   n ]  Resting SOB [  n ] Exertional SOB  [  n]  Orthopnea [n ]   Pedal Edema [  n ]    Palpitations [n  ] Syncope  [ n ]   Presyncope [  n ]   General Review of Systems: [Y] = yes [  ]=no Constitional: recent weight change [  ];  Wt loss over the last 3 months [   ] anorexia [  ]; fatigue [  ]; nausea [  ]; night sweats [  ]; fever [  ]; or chills [  ];           Eye : blurred vision [  ]; diplopia [   ]; vision changes [  ];  Amaurosis fugax[  ]; Resp: cough [  ];  wheezing[  ];  hemoptysis[  ]; shortness of breath[  ]; paroxysmal nocturnal dyspnea[  ]; dyspnea on exertion[  ]; or orthopnea[  ];  GI:  gallstones[  ], vomiting[  ];  dysphagia[  ]; melena[  ];  hematochezia [  ]; heartburn[  ];   Hx of  Colonoscopy[  ]; GU: kidney stones [  ]; hematuria[  ];   dysuria [  ];  nocturia[  ];  history of     obstruction [  ]; urinary frequency [  ]             Skin: rash, swelling[  ];, hair loss[  ];  peripheral edema[  ];  or itching[  ]; Musculosketetal: myalgias[  ];  joint swelling[  ];  joint erythema[  ];  joint pain[  ];  back pain[  ];  Heme/Lymph: bruising[  ];  bleeding[  ];  anemia[  ];  Neuro: TIA[  ];  headaches[  ];  stroke[  ];  vertigo[  ];  seizures[  ];   paresthesias[  ];  difficulty walking[  ];  Psych:depression[  ]; anxiety[  ];  Endocrine: diabetes[  ];  thyroid dysfunction[  ];  Immunizations: Flu up to date [ y ]; Pneumococcal up to date [ y ];  Other:     PHYSICAL EXAMINATION: BP 114/84   Pulse (!) 59   Resp 20   Ht 5\' 9"  (1.753 m)   Wt 182 lb (82.6 kg)   SpO2 98% Comment: RA  BMI 26.88 kg/m  General appearance: alert, cooperative, appears stated age and no distress Head: Normocephalic, without obvious abnormality, atraumatic Neck: no adenopathy, no carotid bruit, no JVD, supple, symmetrical, trachea midline and thyroid not enlarged, symmetric, no  tenderness/mass/nodules Lymph nodes: Cervical, supraclavicular, and axillary nodes normal. Resp: clear to auscultation bilaterally Back: symmetric, no curvature. ROM normal. No CVA tenderness. Cardio: regular rate and rhythm, S1, S2 normal, no murmur, click, rub or gallop GI: soft, non-tender; bowel sounds normal; no masses,  no organomegaly Extremities: extremities normal, atraumatic, no cyanosis or edema and Homans sign is negative, no sign of DVT Neurologic: Grossly normal  Diagnostic Studies & Laboratory data:     Recent Radiology Findings:   Dg Chest 2 View  Result Date: 08/27/2017 CLINICAL DATA:  Preprocedural evaluation EXAM: CHEST - 2 VIEW COMPARISON:  Chest radiograph 07/10/2007 FINDINGS: Cardiac contours upper limits of normal. Low lung volumes. Bibasilar linear opacities. No pleural effusion or pneumothorax. Thoracic spine degenerative changes. IMPRESSION: No acute cardiopulmonary process.  Basilar atelectasis. Electronically Signed   By: Lovey Newcomer M.D.   On: 08/27/2017 20:02      Final Report  CLINICAL DATA: Lung nodules on previous abdomen CT.  EXAM: CT CHEST WITHOUT CONTRAST  TECHNIQUE: Multidetector CT imaging of the chest was performed following the standard protocol without IV contrast.  COMPARISON: Abdomen and pelvis CT 06/27/2017  FINDINGS: Cardiovascular: The heart size is normal. No substantial pericardial effusion. Atherosclerotic calcification is noted in the wall of the thoracic aorta.  Mediastinum/Nodes: No mediastinal lymphadenopathy. There is no hilar lymphadenopathy. The esophagus has normal imaging features. There is no axillary lymphadenopathy.  Lungs/Pleura: The central tracheobronchial airways are patent. 6 mm perifissural nodule right lung (3:65) likely subpleural lymph node. Similar appearance 18 mm ground-glass nodule posterior right lower lung (3:60). 4 mm nodule posterior right costophrenic sulcus is stable. There is atelectasis in the  dependent lung bases bilaterally.  Upper Abdomen: Right renal cysts incompletely visualized.  Musculoskeletal: Unremarkable  IMPRESSION: 1. No change 18 mm ground-glass nodule posterior right lung. Adenocarcinoma cannot be excluded. Follow up by CT is recommended in 12 months, with continued annual surveillance for a minimum of 3 years. These recommendations are taken from:Recommendations for the Management of Subsolid Pulmonary Nodules Detected at CT: A Statement from the [place] [facility] 2013; 266:1, 304-317. 2. Stable 4 mm posterior right lower lobe pulmonary nodule. Attention on follow-up.   Electronically Signed By: Misty Stanley M.D. On: 08/05/2017 10:50  Final Report  CLINICAL DATA: Prostate cancer.  EXAM: CT ABDOMEN AND PELVIS WITHOUT AND WITH CONTRAST  TECHNIQUE: Multidetector CT imaging of the abdomen and pelvis was performed following the standard protocol before and following the bolus administration of intravenous contrast.  CONTRAST: 100 cc Isovue 300.  COMPARISON: No comparison studies available.  FINDINGS: Lower chest: 4 mm nodule identified posterior right costophrenic sulcus (image 22/series 9). 17 mm ground-glass nodule noted right lower lobe (image 3/series 9).  Hepatobiliary: No focal abnormality within the liver parenchyma. There is no evidence for gallstones, gallbladder wall thickening, or pericholecystic fluid. No intrahepatic or extrahepatic biliary dilation.  Pancreas: No focal mass lesion. No dilatation of  the main duct. No intraparenchymal cyst. No peripancreatic edema.  Spleen: No splenomegaly. No focal mass lesion.  Adrenals/Urinary Tract: No adrenal nodule or mass.  Precontrast imaging shows no stones in either kidney or ureter although distal ureters are obscured by streak artifact from bilateral hip replacement. Posterior bladder also obscured.  Imaging after IV contrast administration shows no  suspicious enhancing lesion in either kidney. 6.1 x 6.6 cm simple cyst identified interpolar right kidney. Exophytic 6.2 x 6.4 cm cyst identified in the lower pole the left kidney. 15 mm lower pole left renal cyst noted posteriorly. Other scattered tiny hypoattenuating renal lesions are too small to characterize.  Delayed imaging shows mass-effect on the right intrarenal collecting system and renal pelvis due to the cyst. No evidence for soft tissue wall thickening or intraluminal soft tissue filling defect in either intrarenal collecting system or renal pelvis. Distal ureters have not been included on the delayed imaging.  Stomach/Bowel: Stomach is nondistended. No gastric wall thickening. No evidence of outlet obstruction. Duodenum is normally positioned as is the ligament of Treitz. No small bowel wall thickening. No small bowel dilatation. The terminal ileum is normal. The appendix is normal. Diverticuli are seen scattered along the entire length of the colon without CT findings of diverticulitis.  Vascular/Lymphatic: There is abdominal aortic atherosclerosis without aneurysm. There is no gastrohepatic or hepatoduodenal ligament lymphadenopathy. No intraperitoneal or retroperitoneal lymphadenopathy. No pelvic sidewall lymphadenopathy. Although portions of each pelvic sidewall are obscured by streak artifact.  Reproductive: Prostate gland obscured by streak artifact.  Other: No intraperitoneal free fluid.  Musculoskeletal: Small left groin hernia contains only fat. Patient is status post bilateral hip replacement. Bilateral pars interarticularis defects are noted at L5. No worrisome lytic or sclerotic osseous abnormality.  IMPRESSION: 1. No evidence for metastatic disease in the abdomen or pelvis in this patient with reported history of prostate cancer. Of note, the inferior aspect of both pelvic sidewalls, the prostate gland, and the posterior bladder are obscured by streak  artifact from bilateral hip replacement. 2. 17 mm ground-glass nodule posterior right lower lobe with associated tiny 4 mm nodule in the posterior right costophrenic sulcus. Initial follow-up with CT at 6-12 months is recommended to confirm persistence. If persistent, repeat CT is recommended every 2 years until 5 years of stability has been established. This recommendation follows the consensus statement: Guidelines for Management of Incidental Pulmonary Nodules Detected on CT Images: From the Fleischner Society 2017; Radiology 2017; 284:228-243. 3. Bilateral renal cysts with other renal lesions too small to characterize. 4. Small left groin hernia contains only fat. 5. Aortic Atherosclerois (ICD10-170.0)   Electronically Signed By: Misty Stanley M.D.     I have independently reviewed the above radiology studies  and reviewed the findings with the patient.   Recent Lab Findings: Lab Results  Component Value Date   WBC 9.2 08/10/2012   HGB 9.5 (L) 08/10/2012   HCT 27.4 (L) 08/10/2012   PLT 155 08/10/2012   GLUCOSE 115 (H) 08/09/2012   NA 126 (L) 08/09/2012   K 3.9 08/09/2012   CL 93 (L) 08/09/2012   CREATININE 0.99 08/09/2012   BUN 14 08/09/2012   CO2 23 08/09/2012   INR 0.93 08/07/2012      Assessment / Plan:   1/ 18 mm groundglass opacity right lower lobe, with no previous CT scans for comparison-I discussed and reviewed the films with the patient and his wife.  At this point I recommend to the patient that he continue with  his treatment for prostate cancer.  A PET scan would not help to differentiate the groundglass opacity at this point.  I recommend follow-up CT scan in 6 months rather than 12 months since we have no baseline.  The patient is agreeable with this approach      I  spent 45 nminutes with  the patient face to face and greater then 50% of the time was spent in counseling and coordination of care.    [name] MD      [address] [place] [address] Office [phone]   Beeper [phone]  08/30/2017 2:00 PM

## 2017-08-30 NOTE — Patient Instructions (Signed)
Pulmonary Nodule A pulmonary nodule is a small, round growth of tissue in the lung. Pulmonary nodules can range in size from less than 1/5 inch (4 mm) to a little bigger than an inch (25 mm). Most pulmonary nodules are detected when imaging tests of the lung are being performed for a different problem. Pulmonary nodules are usually not cancerous (benign). However, some pulmonary nodules are cancerous (malignant). Follow-up treatment or testing is based on the size of the pulmonary nodule and your risk of getting lung cancer. What are the causes? Benign pulmonary nodules can be caused by various things. Some of the causes include:  Bacterial, fungal, or viral infections. This is usually an old infection that is no longer active, but it can sometimes be a current, active infection.  A benign mass of tissue.  Inflammation from conditions such as rheumatoid arthritis.  Abnormal blood vessels in the lungs.  Malignant pulmonary nodules can result from lung cancer or from cancers that spread to the lung from other places in the body. What are the signs or symptoms? Pulmonary nodules usually do not cause symptoms. How is this diagnosed? Most often, pulmonary nodules are found incidentally when an X-ray or CT scan is performed to look for some other problem in the lung area. To help determine whether a pulmonary nodule is benign or malignant, your health care provider will take a medical history and order a variety of tests. Tests done may include:  Blood tests.  A skin test called a tuberculin test. This test is used to determine if you have been exposed to the germ that causes tuberculosis.  Chest X-rays. If possible, a new X-ray may be compared with X-rays you have had in the past.  CT scan. This test shows smaller pulmonary nodules more clearly than an X-ray.  Positron emission tomography (PET) scan. In this test, a safe amount of a radioactive substance is injected into the bloodstream. Then,  the scan takes a picture of the pulmonary nodule. The radioactive substance is eliminated from your body in your urine.  Biopsy. A tiny piece of the pulmonary nodule is removed so it can be checked under a microscope.  How is this treated? Pulmonary nodules that are benign normally do not require any treatment because they usually do not cause symptoms or breathing problems. Your health care provider may want to monitor the pulmonary nodule through follow-up CT scans. The frequency of these CT scans will vary based on the size of the nodule and the risk factors for lung cancer. For example, CT scans will need to be done more frequently if the pulmonary nodule is larger and if you have a history of smoking and a family history of cancer. Further testing or biopsies may be done if any follow-up CT scan shows that the size of the pulmonary nodule has increased. Follow these instructions at home:  Only take over-the-counter or prescription medicines as directed by your health care provider.  Keep all follow-up appointments with your health care provider. Contact a health care provider if:  You have trouble breathing when you are active.  You feel sick or unusually tired.  You do not feel like eating.  You lose weight without trying to.  You develop chills or night sweats. Get help right away if:  You cannot catch your breath, or you begin wheezing.  You cannot stop coughing.  You cough up blood.  You become dizzy or feel like you are going to pass out.  You  have sudden chest pain.  You have a fever or persistent symptoms for more than 2-3 days.  You have a fever and your symptoms suddenly get worse. This information is not intended to replace advice given to you by your health care provider. Make sure you discuss any questions you have with your health care provider. Document Released: 10/25/2008 Document Revised: 06/05/2015 Document Reviewed: 06/19/2012 Elsevier Interactive  Patient Education  2017 [employer].

## 2017-09-05 ENCOUNTER — Encounter: Payer: Medicare Other | Admitting: Cardiothoracic Surgery

## 2017-09-07 ENCOUNTER — Other Ambulatory Visit: Payer: Self-pay | Admitting: Urology

## 2017-09-07 DIAGNOSIS — C61 Malignant neoplasm of prostate: Secondary | ICD-10-CM

## 2017-09-11 HISTORY — PX: COLONOSCOPY: SHX174

## 2017-09-16 ENCOUNTER — Telehealth: Payer: Self-pay | Admitting: *Deleted

## 2017-09-16 NOTE — Telephone Encounter (Signed)
CALLED PATIENT TO REMIND OF LABS AND PROCEDURE, LVM FOR A RETURN CALL

## 2017-09-20 ENCOUNTER — Encounter (HOSPITAL_BASED_OUTPATIENT_CLINIC_OR_DEPARTMENT_OTHER): Payer: Self-pay

## 2017-09-21 ENCOUNTER — Other Ambulatory Visit: Payer: Self-pay

## 2017-09-21 ENCOUNTER — Encounter (HOSPITAL_BASED_OUTPATIENT_CLINIC_OR_DEPARTMENT_OTHER): Payer: Self-pay

## 2017-09-21 NOTE — Progress Notes (Signed)
Spoke with:  Juanda Crumble NPO: No food after midnight/Clear liquids until 7:00AM DOS Arrival time:  1100AM Labs: (EKG/CXR 08/27/2017, CBC, CMP, PT, PTT 09/22/2017 in epic) AM medications:  Ranitidine Pre op orders: Yes Ride home:  [name] (wife) [phone] Instructed about fleet enema the morning of surgery

## 2017-09-22 ENCOUNTER — Encounter (HOSPITAL_COMMUNITY)
Admission: RE | Admit: 2017-09-22 | Discharge: 2017-09-22 | Disposition: A | Discharge status: Home / Self Care | Payer: Medicare Other | Source: Ambulatory Visit | Attending: Urology | Admitting: Urology

## 2017-09-22 DIAGNOSIS — Z01818 Encounter for other preprocedural examination: Secondary | ICD-10-CM | POA: Insufficient documentation

## 2017-09-22 LAB — COMPREHENSIVE METABOLIC PANEL
ALT: 30 U/L (ref 0–44)
AST: 28 U/L (ref 15–41)
Albumin: 3.6 g/dL (ref 3.5–5.0)
Alkaline Phosphatase: 59 U/L (ref 38–126)
Anion gap: 9 (ref 5–15)
BILIRUBIN TOTAL: 0.7 mg/dL (ref 0.3–1.2)
BUN: 17 mg/dL (ref 8–23)
CO2: 28 mmol/L (ref 22–32)
Calcium: 9.3 mg/dL (ref 8.9–10.3)
Chloride: 104 mmol/L (ref 98–111)
Creatinine, Ser: 0.78 mg/dL (ref 0.61–1.24)
GFR calc Af Amer: >60 mL/min (ref >60)
Glucose, Bld: 104 mg/dL — ABNORMAL HIGH (ref 70–99)
POTASSIUM: 4.3 mmol/L (ref 3.5–5.1)
Sodium: 141 mmol/L (ref 135–145)
TOTAL PROTEIN: 6.4 g/dL — AB (ref 6.5–8.1)

## 2017-09-22 LAB — CBC
HEMATOCRIT: 43.8 % (ref 39.0–52.0)
Hemoglobin: 14 g/dL (ref 13.0–17.0)
MCH: 31.1 pg (ref 26.0–34.0)
MCHC: 32 g/dL (ref 30.0–36.0)
MCV: 97.3 fL (ref 78.0–100.0)
Platelets: 187 10*3/uL (ref 150–400)
RBC: 4.5 MIL/uL (ref 4.22–5.81)
RDW: 14.8 % (ref 11.5–15.5)
WBC: 5.4 10*3/uL (ref 4.0–10.5)

## 2017-09-22 LAB — APTT: APTT: 30 s (ref 24–36)

## 2017-09-22 LAB — PROTIME-INR
INR: 0.9
PROTHROMBIN TIME: 12.1 s (ref 11.4–15.2)

## 2017-09-29 ENCOUNTER — Ambulatory Visit (HOSPITAL_BASED_OUTPATIENT_CLINIC_OR_DEPARTMENT_OTHER): Payer: Medicare Other | Admitting: Anesthesiology

## 2017-09-29 ENCOUNTER — Other Ambulatory Visit: Payer: Self-pay

## 2017-09-29 ENCOUNTER — Encounter (HOSPITAL_BASED_OUTPATIENT_CLINIC_OR_DEPARTMENT_OTHER): Payer: Self-pay | Admitting: Anesthesiology

## 2017-09-29 ENCOUNTER — Ambulatory Visit (HOSPITAL_COMMUNITY): Payer: Medicare Other

## 2017-09-29 ENCOUNTER — Ambulatory Visit (HOSPITAL_BASED_OUTPATIENT_CLINIC_OR_DEPARTMENT_OTHER)
Admission: RE | Admit: 2017-09-29 | Discharge: 2017-09-29 | Disposition: A | Discharge status: Home / Self Care | Payer: Medicare Other | Source: Ambulatory Visit | Attending: Urology | Admitting: Urology

## 2017-09-29 ENCOUNTER — Encounter (HOSPITAL_BASED_OUTPATIENT_CLINIC_OR_DEPARTMENT_OTHER)
Admission: RE | Disposition: A | Discharge status: Home / Self Care | Payer: Self-pay | Source: Ambulatory Visit | Attending: Urology

## 2017-09-29 DIAGNOSIS — N529 Male erectile dysfunction, unspecified: Secondary | ICD-10-CM | POA: Diagnosis not present

## 2017-09-29 DIAGNOSIS — Z96643 Presence of artificial hip joint, bilateral: Secondary | ICD-10-CM | POA: Diagnosis not present

## 2017-09-29 DIAGNOSIS — J45909 Unspecified asthma, uncomplicated: Secondary | ICD-10-CM | POA: Insufficient documentation

## 2017-09-29 DIAGNOSIS — Z85828 Personal history of other malignant neoplasm of skin: Secondary | ICD-10-CM | POA: Insufficient documentation

## 2017-09-29 DIAGNOSIS — Z87891 Personal history of nicotine dependence: Secondary | ICD-10-CM | POA: Insufficient documentation

## 2017-09-29 DIAGNOSIS — Z79899 Other long term (current) drug therapy: Secondary | ICD-10-CM | POA: Diagnosis not present

## 2017-09-29 DIAGNOSIS — I10 Essential (primary) hypertension: Secondary | ICD-10-CM | POA: Diagnosis not present

## 2017-09-29 DIAGNOSIS — C61 Malignant neoplasm of prostate: Secondary | ICD-10-CM | POA: Insufficient documentation

## 2017-09-29 DIAGNOSIS — Z01818 Encounter for other preprocedural examination: Secondary | ICD-10-CM

## 2017-09-29 DIAGNOSIS — K219 Gastro-esophageal reflux disease without esophagitis: Secondary | ICD-10-CM | POA: Diagnosis not present

## 2017-09-29 HISTORY — PX: RADIOACTIVE SEED IMPLANT: SHX5150

## 2017-09-29 HISTORY — DX: Headache, unspecified: R51.9

## 2017-09-29 HISTORY — DX: Personal history of other diseases of male genital organs: Z87.438

## 2017-09-29 HISTORY — PX: CYSTOSCOPY: SHX5120

## 2017-09-29 HISTORY — DX: Solitary pulmonary nodule: R91.1

## 2017-09-29 HISTORY — DX: Headache: R51

## 2017-09-29 HISTORY — DX: Benign lipomatous neoplasm, unspecified: D17.9

## 2017-09-29 HISTORY — DX: Cyst of kidney, acquired: N28.1

## 2017-09-29 HISTORY — DX: Hematuria, unspecified: R31.9

## 2017-09-29 HISTORY — PX: SPACE OAR INSTILLATION: SHX6769

## 2017-09-29 HISTORY — DX: Male erectile dysfunction, unspecified: N52.9

## 2017-09-29 HISTORY — DX: Presence of external hearing-aid: Z97.4

## 2017-09-29 SURGERY — INSERTION, RADIATION SOURCE, PROSTATE
Anesthesia: General | Site: Rectum

## 2017-09-29 MED ORDER — HYDROCODONE-ACETAMINOPHEN 7.5-325 MG PO TABS
1.0000 | ORAL_TABLET | Freq: Once | ORAL | Status: AC | PRN
  Administered 2017-09-29: 1 via ORAL
  Filled 2017-09-29: qty 1

## 2017-09-29 MED ORDER — DEXAMETHASONE SODIUM PHOSPHATE 4 MG/ML IJ SOLN
INTRAMUSCULAR | Status: DC | PRN
  Administered 2017-09-29: 10 mg via INTRAVENOUS

## 2017-09-29 MED ORDER — DEXAMETHASONE SODIUM PHOSPHATE 10 MG/ML IJ SOLN
INTRAMUSCULAR | Status: AC
  Filled 2017-09-29: qty 1

## 2017-09-29 MED ORDER — LIDOCAINE 2% (20 MG/ML) 5 ML SYRINGE
INTRAMUSCULAR | Status: AC
  Filled 2017-09-29: qty 5

## 2017-09-29 MED ORDER — MIDAZOLAM HCL 5 MG/5ML IJ SOLN: INTRAMUSCULAR | Status: DC | PRN

## 2017-09-29 MED ORDER — MEPERIDINE HCL 25 MG/ML IJ SOLN
6.2500 mg | INTRAMUSCULAR | Status: DC | PRN
  Filled 2017-09-29: qty 1

## 2017-09-29 MED ORDER — FENTANYL CITRATE (PF) 100 MCG/2ML IJ SOLN
INTRAMUSCULAR | Status: AC
  Filled 2017-09-29: qty 2

## 2017-09-29 MED ORDER — MIDAZOLAM HCL 5 MG/5ML IJ SOLN
INTRAMUSCULAR | Status: DC | PRN
  Administered 2017-09-29: 1 mg via INTRAVENOUS

## 2017-09-29 MED ORDER — ONDANSETRON HCL 4 MG/2ML IJ SOLN
INTRAMUSCULAR | Status: DC | PRN
  Administered 2017-09-29: 4 mg via INTRAVENOUS

## 2017-09-29 MED ORDER — LACTATED RINGERS IV SOLN
INTRAVENOUS | Status: DC
  Administered 2017-09-29 (×2): via INTRAVENOUS
  Filled 2017-09-29: qty 1000

## 2017-09-29 MED ORDER — HYDROMORPHONE HCL 1 MG/ML IJ SOLN
INTRAMUSCULAR | Status: AC
  Filled 2017-09-29: qty 1

## 2017-09-29 MED ORDER — PROPOFOL 10 MG/ML IV BOLUS
INTRAVENOUS | Status: DC | PRN
  Administered 2017-09-29: 150 mg via INTRAVENOUS
  Administered 2017-09-29: 30 mg via INTRAVENOUS
  Administered 2017-09-29: 50 mg via INTRAVENOUS

## 2017-09-29 MED ORDER — SODIUM CHLORIDE 0.9 % IJ SOLN
INTRAMUSCULAR | Status: DC | PRN
  Administered 2017-09-29: 10 mL

## 2017-09-29 MED ORDER — PROPOFOL 10 MG/ML IV BOLUS
INTRAVENOUS | Status: AC
  Filled 2017-09-29: qty 20

## 2017-09-29 MED ORDER — HYDROCODONE-ACETAMINOPHEN 7.5-325 MG PO TABS
ORAL_TABLET | ORAL | Status: AC
  Filled 2017-09-29: qty 1

## 2017-09-29 MED ORDER — CIPROFLOXACIN IN D5W 400 MG/200ML IV SOLN
INTRAVENOUS | Status: AC
  Filled 2017-09-29: qty 200

## 2017-09-29 MED ORDER — SODIUM CHLORIDE 0.9 % IR SOLN
Status: DC | PRN
  Administered 2017-09-29: 1000 mL via INTRAVESICAL

## 2017-09-29 MED ORDER — LIDOCAINE 2% (20 MG/ML) 5 ML SYRINGE
INTRAMUSCULAR | Status: DC | PRN
  Administered 2017-09-29: 100 mg via INTRAVENOUS

## 2017-09-29 MED ORDER — FENTANYL CITRATE (PF) 100 MCG/2ML IJ SOLN
INTRAMUSCULAR | Status: DC | PRN
  Administered 2017-09-29 (×2): 25 ug via INTRAVENOUS
  Administered 2017-09-29 (×3): 50 ug via INTRAVENOUS

## 2017-09-29 MED ORDER — IOHEXOL 300 MG/ML  SOLN
INTRAMUSCULAR | Status: DC | PRN
  Administered 2017-09-29: 7 mL

## 2017-09-29 MED ORDER — CIPROFLOXACIN IN D5W 400 MG/200ML IV SOLN
400.0000 mg | INTRAVENOUS | Status: AC
  Administered 2017-09-29: 400 mg via INTRAVENOUS
  Filled 2017-09-29: qty 200

## 2017-09-29 MED ORDER — MIDAZOLAM HCL 2 MG/2ML IJ SOLN
INTRAMUSCULAR | Status: AC
  Filled 2017-09-29: qty 2

## 2017-09-29 MED ORDER — FLEET ENEMA 7-19 GM/118ML RE ENEM
1.0000 | ENEMA | Freq: Once | RECTAL | Status: DC
  Filled 2017-09-29: qty 1

## 2017-09-29 MED ORDER — EPHEDRINE SULFATE-NACL 50-0.9 MG/10ML-% IV SOSY
PREFILLED_SYRINGE | INTRAVENOUS | Status: DC | PRN
  Administered 2017-09-29 (×3): 10 mg via INTRAVENOUS

## 2017-09-29 MED ORDER — ONDANSETRON HCL 4 MG/2ML IJ SOLN
INTRAMUSCULAR | Status: AC
  Filled 2017-09-29: qty 2

## 2017-09-29 MED ORDER — EPHEDRINE 5 MG/ML INJ
INTRAVENOUS | Status: AC
  Filled 2017-09-29: qty 10

## 2017-09-29 MED ORDER — HYDROMORPHONE HCL 1 MG/ML IJ SOLN
0.2500 mg | INTRAMUSCULAR | Status: DC | PRN
  Administered 2017-09-29: 0.25 mg via INTRAVENOUS
  Filled 2017-09-29: qty 0.5

## 2017-09-29 MED ORDER — KETOROLAC TROMETHAMINE 15 MG/ML IJ SOLN
INTRAMUSCULAR | Status: DC | PRN
  Administered 2017-09-29: 15 mg via INTRAVENOUS

## 2017-09-29 MED ORDER — ONDANSETRON HCL 4 MG/2ML IJ SOLN
4.0000 mg | Freq: Once | INTRAMUSCULAR | Status: DC | PRN
  Filled 2017-09-29: qty 2

## 2017-09-29 MED ORDER — KETOROLAC TROMETHAMINE 30 MG/ML IJ SOLN
INTRAMUSCULAR | Status: AC
  Filled 2017-09-29: qty 1

## 2017-09-29 SURGICAL SUPPLY — 37 items
BAG URINE DRAINAGE (UROLOGICAL SUPPLIES) ×4 IMPLANT
BLADE CLIPPER SURG (BLADE) ×4 IMPLANT
CATH FOLEY 2WAY SLVR  5CC 16FR (CATHETERS) ×1
CATH FOLEY 2WAY SLVR 5CC 16FR (CATHETERS) ×3 IMPLANT
CATH ROBINSON RED A/P 16FR (CATHETERS) IMPLANT
CATH ROBINSON RED A/P 20FR (CATHETERS) ×4 IMPLANT
CLOTH BEACON ORANGE TIMEOUT ST (SAFETY) ×4 IMPLANT
CONT SPECI 4OZ STER CLIK (MISCELLANEOUS) ×8 IMPLANT
COVER BACK TABLE 60X90IN (DRAPES) ×4 IMPLANT
COVER MAYO STAND STRL (DRAPES) ×4 IMPLANT
DRSG TEGADERM 4X4.75 (GAUZE/BANDAGES/DRESSINGS) ×4 IMPLANT
DRSG TEGADERM 8X12 (GAUZE/BANDAGES/DRESSINGS) ×8 IMPLANT
GAUZE SPONGE 4X4 12PLY STRL (GAUZE/BANDAGES/DRESSINGS) ×4 IMPLANT
GLOVE BIO SURGEON STRL SZ 6 (GLOVE) IMPLANT
GLOVE BIO SURGEON STRL SZ 6.5 (GLOVE) IMPLANT
GLOVE BIO SURGEON STRL SZ7 (GLOVE) IMPLANT
GLOVE BIO SURGEON STRL SZ8 (GLOVE) ×8 IMPLANT
GLOVE BIOGEL PI IND STRL 6 (GLOVE) IMPLANT
GLOVE BIOGEL PI IND STRL 6.5 (GLOVE) IMPLANT
GLOVE BIOGEL PI IND STRL 8 (GLOVE) ×3 IMPLANT
GLOVE BIOGEL PI INDICATOR 6 (GLOVE)
GLOVE BIOGEL PI INDICATOR 6.5 (GLOVE)
GLOVE BIOGEL PI INDICATOR 8 (GLOVE) ×1
GLOVE ECLIPSE 8.0 STRL XLNG CF (GLOVE) ×4 IMPLANT
GLOVE INDICATOR 7.0 STRL GRN (GLOVE) IMPLANT
GOWN STRL REUS W/TWL XL LVL3 (GOWN DISPOSABLE) ×4 IMPLANT
HOLDER FOLEY CATH W/STRAP (MISCELLANEOUS) ×4 IMPLANT
I-SEED AGX100 ×252 IMPLANT
IV NS 1000ML (IV SOLUTION) ×1
IV NS 1000ML BAXH (IV SOLUTION) ×3 IMPLANT
KIT TURNOVER CYSTO (KITS) ×4 IMPLANT
MARKER SKIN DUAL TIP RULER LAB (MISCELLANEOUS) ×4 IMPLANT
PACK CYSTO (CUSTOM PROCEDURE TRAY) ×4 IMPLANT
SUT BONE WAX W31G (SUTURE) ×4 IMPLANT
SYR 10ML LL (SYRINGE) ×4 IMPLANT
UNDERPAD 30X30 (UNDERPADS AND DIAPERS) ×8 IMPLANT
WATER STERILE IRR 500ML POUR (IV SOLUTION) ×4 IMPLANT

## 2017-09-29 NOTE — Discharge Instructions (Signed)
°  Post Anesthesia Home Care Instructions ° °Activity: °Get plenty of rest for the remainder of the day. A responsible adult should stay with you for 24 hours following the procedure.  °For the next 24 hours, DO NOT: °-Drive a car °-Operate machinery °-Drink alcoholic beverages °-Take any medication unless instructed by your physician °-Make any legal decisions or sign important papers. ° °Meals: °Start with liquid foods such as gelatin or soup. Progress to regular foods as tolerated. Avoid greasy, spicy, heavy foods. If nausea and/or vomiting occur, drink only clear liquids until the nausea and/or vomiting subsides. Call your physician if vomiting continues. ° °Special Instructions/Symptoms: °Your throat may feel dry or sore from the anesthesia or the breathing tube placed in your throat during surgery. If this causes discomfort, gargle with warm salt water. The discomfort should disappear within 24 hours. ° °If you had a scopolamine patch placed behind your ear for the management of post- operative nausea and/or vomiting: ° °1. The medication in the patch is effective for 72 hours, after which it should be removed.  Wrap patch in a tissue and discard in the trash. Wash hands thoroughly with soap and water. °2. You may remove the patch earlier than 72 hours if you experience unpleasant side effects which may include dry mouth, dizziness or visual disturbances. °3. Avoid touching the patch. Wash your hands with soap and water after contact with the patch. °  °Radioactive Seed Implant Home Care Instructions ° ° °Activity:    Rest for the remainder of the day.  Do not drive or operate equipment today.  You may resume normal  activities in a few days as instructed by your physician, without risk of harmful radiation exposure to those around you, provided you follow the time and distance precautions on the Radiation Oncology Instruction Sheet. ° ° °Meals: Drink plenty of lipuids and eat light foods, such as gelatin or  soup this evening .  You may return to normal meal plan tomorrow. ° °Return °To Work: You may return to work as instructed by your physician. ° °Special °Instruction:   If any seeds are found, use tweezers to pick up seeds and place in a glass container of any kind and bring to your physician's office. ° °Call your physician if any of these symptoms occur: ° °· Persistent or heavy bleeding °· Urine stream diminishes or stops completely after catheter is removed °· Fever equal to or greater than 101 degrees F °· Cloudy urine with a strong foul odor °· Severe pain ° °You may feel some burning pain and/or hesitancy when you urinate after the catheter is removed.  These symptoms may increase over the next few weeks, but should diminish within forur to six weeks.  Applying moist heat to the lower abdomen or a hot tub bath may help relieve the pain.  If the discomfort becomes severe, please call your physician for additional medications. ° ° ° °

## 2017-09-29 NOTE — Transfer of Care (Signed)
  Last Vitals:  Vitals Value Taken Time  BP 150/86 09/29/2017  2:37 PM  Temp 36.3 C 09/29/2017  2:37 PM  Pulse 71 09/29/2017  2:39 PM  Resp 15 09/29/2017  2:39 PM  SpO2 100 % 09/29/2017  2:39 PM  Vitals shown include unvalidated device data.  Last Pain:  Vitals:   09/29/17 1137  TempSrc:   PainSc: 0-No pain      Patients Stated Pain Goal: 6 (09/29/17 1137)  Immediate Anesthesia Transfer of Care Note  Patient: Frederick Hall  Procedure(s) Performed: Procedure(s) (LRB): RADIOACTIVE SEED IMPLANT/BRACHYTHERAPY IMPLANT (N/A) SPACE OAR INSTILLATION (N/A) CYSTOSCOPY (N/A)  Patient Location: PACU  Anesthesia Type: General  Level of Consciousness: awake, alert  and oriented  Airway & Oxygen Therapy: Patient Spontanous Breathing and Patient connected to nasal cannula oxygen  Post-op Assessment: Report given to PACU RN and Post -op Vital signs reviewed and stable  Post vital signs: Reviewed and stable  Complications: No apparent anesthesia complications

## 2017-09-29 NOTE — H&P (Signed)
H&P  Chief Complaint: Prostate cancer  History of Present Illness: 77 year old male prsents for I-125 brachytherapy and SpaceOAR in advance of EBRT for mgmt of prostate cancer.  He underwent TRUS/Bx on 5.29.2019. PSA 8.07, prostate volume 32.5 ml. PSAD 0.25.  7/12 cores positive for Ca--  2 with GS 4+4  4 with GS 4+3  1 with GS 3+3   6.17.2019: bone scan revealed no evidence of osseous metastatic disease.  CT abdomen and pelvis--1. No evidence for metastatic disease in the abdomen or pelvis in  this patient with reported history of prostate cancer. Of note, the  inferior aspect of both pelvic sidewalls, the prostate gland, and  the posterior bladder are obscured by streak artifact from bilateral  hip replacement.  2. 17 mm ground-glass nodule posterior right lower lobe with  associated tiny 4 mm nodule in the posterior right costophrenic  sulcus. Initial follow-up with CT at 6-12 months is recommended to  confirm persistence.  3. Bilateral renal cysts with other renal lesions too small to  characterize.  4. Small left groin hernia contains only fat.  5. Aortic Atherosclerois (ICD10-170.0)   7.17.2019: He initiated ADT with Firmagon 240 mg    Past Medical History:  Diagnosis Date  . Arthritis   . Asthma 2005   "attack x 1"  . Erectile dysfunction   . GERD (gastroesophageal reflux disease)   . Headache    "Stabbing"  . Hematuria 12/2016  . History of acute prostatitis 12/2016  . Hypertension   . Lipoma 02/08/2014   Small subcutaneous mass right temporal region, noted on MRI Brain  . Nodule of right lung 08/26/2017   65mm ground glass posterior nodule, noted on CT chest  . Osteoarthritis of left hip 08/07/2012  . Osteoarthritis of right hip 05/23/2012  . Prostate cancer ([place])   . Renal cyst, right 06/27/2017   Large peripelvic, noted on NM Bone scan  . Skin cancer   . Wears hearing aid     Past Surgical History:  Procedure Laterality Date  . COLONOSCOPY  09/2017   . KNEE ARTHROSCOPY Left   . LIPOMA EXCISION     multiple  . SHOULDER ARTHROSCOPY Right   . TONSILLECTOMY    . TOTAL HIP ARTHROPLASTY Right 05/23/2012   Procedure: TOTAL HIP ARTHROPLASTY;  Surgeon: Johnny Bridge, MD;  Location: [facility];  Service: Orthopedics;  Laterality: Right;  . TOTAL HIP ARTHROPLASTY Left 08/07/2012   Procedure: TOTAL HIP ARTHROPLASTY- left ;  Surgeon: Johnny Bridge, MD;  Location: [facility];  Service: Orthopedics;  Laterality: Left;    Home Medications:  Allergies as of 09/29/2017      Reactions   Penicillins Anaphylaxis   Childhood allergy. Tolerated cefazolin 05/23/12.   Cefuroxime Axetil Other (See Comments)   um um      Medication List    Notice   Cannot display discharge medications because the patient has not yet been admitted.     Allergies:  Allergies  Allergen Reactions  . Penicillins Anaphylaxis    Childhood allergy. Tolerated cefazolin 05/23/12.   . Cefuroxime Axetil Other (See Comments)    um um     Family History  Problem Relation Age of Onset  . Cancer Mother   . Cancer Father     Social History:  reports that he quit smoking about 45 years ago. His smoking use included cigarettes. He has a 15.00 pack-year smoking history. He has never used smokeless tobacco. He reports that he  drinks about 7.0 standard drinks of alcohol per week. He reports that he does not use drugs.  ROS: A complete review of systems was performed.  All systems are negative except for pertinent findings as noted.  Physical Exam:  Vital signs in last 24 hours:   Constitutional:  Alert and oriented, No acute distress Cardiovascular: Regular rate  Respiratory: Normal respiratory effort GI: Abdomen is soft, nontender, nondistended, no abdominal masses Genitourinary: No CVAT. Normal male phallus, testes are descended bilaterally and non-tender and without masses, scrotum is normal in appearance without lesions or masses, perineum is normal on  inspection. Lymphatic: No lymphadenopathy Neurologic: Grossly intact, no focal deficits Psychiatric: Normal mood and affect  Laboratory Data:  No results for input(s): WBC, HGB, HCT, PLT in the last 72 hours.  No results for input(s): NA, K, CL, GLUCOSE, BUN, CALCIUM, CREATININE in the last 72 hours.  Invalid input(s): CO3   No results found for this or any previous visit (from the past 24 hour(s)). No results found for this or any previous visit (from the past 240 hour(s)).  Renal Function: Recent Labs    09/22/17 0951  CREATININE 0.78   Estimated Creatinine Clearance: 79.8 mL/min (by C-G formula based on SCr of 0.78 mg/dL).  Radiologic Imaging: No results found.  Impression/Assessment:  Prostate cancer  Plan:  I-125 brachytherapy, SpaceOAR

## 2017-09-29 NOTE — Anesthesia Procedure Notes (Signed)
Procedure Name: LMA Insertion Date/Time: 09/29/2017 1:07 PM Performed by: Lidia Collum, MD Pre-anesthesia Checklist: Patient identified, Emergency Drugs available, Suction available and Patient being monitored Patient Re-evaluated:Patient Re-evaluated prior to induction Oxygen Delivery Method: Circle system utilized Preoxygenation: Pre-oxygenation with 100% oxygen Induction Type: IV induction Ventilation: Mask ventilation without difficulty LMA: LMA inserted LMA Size: 5.0 Number of attempts: 1 Airway Equipment and Method: Bite block Placement Confirmation: positive ETCO2 Tube secured with: Tape Dental Injury: Teeth and Oropharynx as per pre-operative assessment  Comments: Attempted to place 4 LMA, unable to ventilate well due to large leak. 5 LMA placed with ease with good seal, and able ventilate easily.

## 2017-09-29 NOTE — Anesthesia Preprocedure Evaluation (Signed)
Anesthesia Evaluation  Patient identified by MRN, date of birth, ID band Patient awake    Reviewed: Allergy & Precautions, NPO status , Patient's Chart, lab work & pertinent test results  Airway Mallampati: II  TM Distance: >3 FB Neck ROM: Full    Dental no notable dental hx. (+) Teeth Intact   Pulmonary asthma , former smoker,    Pulmonary exam normal breath sounds clear to auscultation       Cardiovascular hypertension, Pt. on medications Normal cardiovascular exam Rhythm:Regular Rate:Normal     Neuro/Psych  Headaches, negative psych ROS   GI/Hepatic Neg liver ROS, GERD  Medicated and Controlled,  Endo/Other  negative endocrine ROS  Renal/GU Renal disease   Prostate Ca    Musculoskeletal  (+) Arthritis , Osteoarthritis,    Abdominal   Peds  Hematology negative hematology ROS (+)   Anesthesia Other Findings   Reproductive/Obstetrics                             Anesthesia Physical Anesthesia Plan  ASA: II  Anesthesia Plan: General   Post-op Pain Management:    Induction: Intravenous  PONV Risk Score and Plan: 4 or greater and Dexamethasone and Treatment may vary due to age or medical condition  Airway Management Planned: LMA  Additional Equipment:   Intra-op Plan:   Post-operative Plan: Extubation in OR  Informed Consent: I have reviewed the patients History and Physical, chart, labs and discussed the procedure including the risks, benefits and alternatives for the proposed anesthesia with the patient or authorized representative who has indicated his/her understanding and acceptance.   Dental advisory given  Plan Discussed with: CRNA and Surgeon  Anesthesia Plan Comments:         Anesthesia Quick Evaluation

## 2017-09-29 NOTE — Interval H&P Note (Signed)
History and Physical Interval Note:  09/29/2017 12:48 PM  Frederick Hall  has presented today for surgery, with the diagnosis of PROSTATE CANCER  The various methods of treatment have been discussed with the patient and family. After consideration of risks, benefits and other options for treatment, the patient has consented to  Procedure(s): RADIOACTIVE SEED IMPLANT/BRACHYTHERAPY IMPLANT (N/A) SPACE OAR INSTILLATION (N/A) as a surgical intervention .  The patient's history has been reviewed, patient examined, no change in status, stable for surgery.  I have reviewed the patient's chart and labs.  Questions were answered to the patient's satisfaction.     Frederick Hall

## 2017-09-29 NOTE — Op Note (Signed)
Preoperative diagnosis: Clinical stage TI C adenocarcinoma the prostate   Postoperative diagnosis: Same   Procedure: I-125 prostate seed implantation with Nucletron robotic implanter, flexible cystoscopy  Surgeon: Lillette Boxer. Maiya Kates M.D.  Radiation Oncologist: Tyler Pita, M.D.  Anesthesia: Gen.   Indications: Patient  was diagnosed with clinical stage TI C prostate cancer. We had extensive discussion with him about treatment options versus. He elected to proceed with combination radiotherapy. He underwent consultation my office as well as with Dr. Tammi Klippel. He appeared to understand the advantages disadvantages potential risks of this treatment option. Full informed consent has been obtained.   Technique and findings: Patient was brought the operating room where he had successful induction of general anesthesia. He was placed in dorso-lithotomy position and prepped and draped in usual manner. Appropriate surgical timeout was performed. Radiation oncology department placed a transrectal ultrasound probe anchoring stand. Foley catheter with contrast in the balloon was inserted without difficulty. Anchoring needles were placed within the prostate. Rectal tube was placed. Real-time contouring of the urethra prostate and rectum were performed and the dosing parameters were established. Targeted dose was 110 gray.  I was then called  to the operating suite suite for placement of the needles. A second timeout was performed. All needle passage was done with real-time transrectal ultrasound guidance with the sagittal plane. A total of 24 needles were placed. The implantation itself was done with the robotic implanter. 63 active seeds were implanted.  I then proceeded with placement of SpaceOARby introducing a needle with the bevel angled inferiorly approximately 2 cm superior to the anus. This was angled downward and under direct ultrasound was placed within the space between the prostatic capsule and  rectum. This was confirmed with a small amount of sterile saline injected and this was performed under direct ultrasound. I then attached the SpaceOARto the needle and injected this in the space between the prostate and rectum with good placement noted. The Foley catheter was removed and flexible cystoscopy failed to show any seeds outside the prostate.  The patient was brought to recovery room in stable condition, having tolerated the procedure well.Marland Kitchen

## 2017-09-30 ENCOUNTER — Encounter (HOSPITAL_BASED_OUTPATIENT_CLINIC_OR_DEPARTMENT_OTHER): Payer: Self-pay | Admitting: Urology

## 2017-09-30 NOTE — Progress Notes (Signed)
  Radiation Oncology         [phone] ________________________________  Name: VESTER BALTHAZOR MRN: [id number]  Date: 09/30/2017  DOB: 09/21/1940       Prostate Seed Implant  CC:[name], MD  No ref. provider found  DIAGNOSIS: 77 y.o. gentleman with high risk, Stage T1c adenocarcinoma of the prostate with Gleason Score of 4+4, and PSA of 7.94.    ICD-10-CM   1. Pre-op testing Z01.818 DG Chest 2 View    DG Chest 2 View    PROCEDURE: Insertion of radioactive I-125 seeds into the prostate gland.  RADIATION DOSE: 110 Gy, boost therapy.  TECHNIQUE: JEB SCHLOEMER was brought to the operating room with the urologist. He was placed in the dorsolithotomy position. He was catheterized and a rectal tube was inserted. The perineum was shaved, prepped and draped. The ultrasound probe was then introduced into the rectum to see the prostate gland.  TREATMENT DEVICE: A needle grid was attached to the ultrasound probe stand and anchor needles were placed.  3D PLANNING: The prostate was imaged in 3D using a sagittal sweep of the prostate probe. These images were transferred to the planning computer. There, the prostate, urethra and rectum were defined on each axial reconstructed image. Then, the software created an optimized 3D plan and a few seed positions were adjusted. The quality of the plan was reviewed using [facility] information for the target and the following two organs at risk:  Urethra and Rectum.  Then the accepted plan was printed and handed off to the radiation therapist.  Under my supervision, the custom loading of the seeds and spacers was carried out and loaded into sealed vicryl sleeves.  These pre-loaded needles were then placed into the needle holder.Marland Kitchen  PROSTATE VOLUME STUDY:  Using transrectal ultrasound the volume of the prostate was verified to be 26.8 cc.  SPECIAL TREATMENT PROCEDURE/SUPERVISION AND HANDLING: The pre-loaded needles were then delivered under sagittal  guidance. A total of 24 needles were used to deposit 63 seeds in the prostate gland. The individual seed activity was 0.325 mCi.  SpaceOAR:  Yes  COMPLEX SIMULATION: At the end of the procedure, an anterior radiograph of the pelvis was obtained to document seed positioning and count. Cystoscopy was performed to check the urethra and bladder.  MICRODOSIMETRY: At the end of the procedure, the patient was emitting 0.056 mR/hr at 1 meter. Accordingly, he was considered safe for hospital discharge.  PLAN: The patient will return to the radiation oncology clinic for post implant CT dosimetry in three weeks.   ________________________________  Sheral Apley Tammi Klippel, M.D.

## 2017-10-03 NOTE — Anesthesia Postprocedure Evaluation (Signed)
Anesthesia Post Note  Patient: Frederick Hall  Procedure(s) Performed: RADIOACTIVE SEED IMPLANT/BRACHYTHERAPY IMPLANT (N/A Prostate) SPACE OAR INSTILLATION (N/A Rectum) CYSTOSCOPY (N/A Bladder)     Patient location during evaluation: PACU Anesthesia Type: General Level of consciousness: awake and alert Pain management: pain level controlled Vital Signs Assessment: post-procedure vital signs reviewed and stable Respiratory status: spontaneous breathing, nonlabored ventilation, respiratory function stable and patient connected to nasal cannula oxygen Cardiovascular status: blood pressure returned to baseline and stable Postop Assessment: no apparent nausea or vomiting Anesthetic complications: no    Last Vitals:  Vitals:   09/29/17 1515 09/29/17 1614  BP: 137/88 127/87  Pulse: 63 63  Resp: 15 16  Temp:  36.5 C  SpO2: 93% 99%    Last Pain:  Vitals:   09/29/17 1614  TempSrc: Oral  PainSc: 3    Pain Goal: Patients Stated Pain Goal: 6 (09/29/17 1137)               Lidia Collum

## 2017-10-13 ENCOUNTER — Telehealth: Payer: Self-pay | Admitting: *Deleted

## 2017-10-13 NOTE — Telephone Encounter (Signed)
Called patient to remind of post seed appts. and MRI for 10-14-17, lvm for a return call

## 2017-10-14 ENCOUNTER — Ambulatory Visit
Admission: RE | Admit: 2017-10-14 | Discharge: 2017-10-14 | Disposition: A | Discharge status: Home / Self Care | Payer: Medicare Other | Source: Ambulatory Visit | Attending: Radiation Oncology | Admitting: Radiation Oncology

## 2017-10-14 ENCOUNTER — Encounter: Payer: Self-pay | Admitting: Medical Oncology

## 2017-10-14 ENCOUNTER — Ambulatory Visit (HOSPITAL_COMMUNITY)
Admission: RE | Admit: 2017-10-14 | Discharge: 2017-10-14 | Disposition: A | Discharge status: Home / Self Care | Payer: Medicare Other | Source: Ambulatory Visit | Attending: Urology | Admitting: Urology

## 2017-10-14 DIAGNOSIS — C61 Malignant neoplasm of prostate: Secondary | ICD-10-CM | POA: Insufficient documentation

## 2017-10-14 NOTE — Progress Notes (Signed)
  Radiation Oncology         [phone] ________________________________  Name: Frederick Hall MRN: [id number]  Date: 10/14/2017  DOB: 01-18-1940  SIMULATION AND TREATMENT PLANNING NOTE    ICD-10-CM   1. Malignant neoplasm of prostate ([place]) C61     DIAGNOSIS:  77 y.o. gentleman with high risk, Stage T1c adenocarcinoma of the prostate with Gleason Score of 4+4, and PSA of 7.94.  NARRATIVE:  The patient was brought to the [facility].  Identity was confirmed.  All relevant records and images related to the planned course of therapy were reviewed.  The patient freely provided informed written consent to proceed with treatment after reviewing the details related to the planned course of therapy. The consent form was witnessed and verified by the simulation staff.  Then, the patient was set-up in a stable reproducible supine position for radiation therapy.  A vacuum lock pillow device was custom fabricated to position his legs in a reproducible immobilized position.  Then, I performed a urethrogram under sterile conditions to identify the prostatic apex.  CT images were obtained.  Surface markings were placed.  The CT images were loaded into the planning software.  Then the prostate target and avoidance structures including the rectum, bladder, bowel and hips were contoured.  Treatment planning then occurred.  The radiation prescription was entered and confirmed.  A total of one complex treatment devices were fabricated. I have requested : Intensity Modulated Radiotherapy (IMRT) is medically necessary for this case for the following reason:  Rectal sparing.Marland Kitchen  PLAN:  The patient will receive 45 Gy in 25 fractions of 1.8 Gy, to supplement an up-front prostate seed implant boost of 110 Gy, performed on 09/29/17, to achieve a total nominal dose of 165 Gy.  ________________________________  Sheral Apley. Tammi Klippel, M.D.

## 2017-10-14 NOTE — Progress Notes (Signed)
Mr. Buckhalter states the seed implant went well but he experienced back and leg pain. He saw his orthopedic physician and received steroids. Today the is the first day his back feels normal. He is being simed today and starts treatment 10/15.

## 2017-10-24 DIAGNOSIS — C61 Malignant neoplasm of prostate: Secondary | ICD-10-CM | POA: Diagnosis not present

## 2017-10-25 ENCOUNTER — Ambulatory Visit
Admission: RE | Admit: 2017-10-25 | Discharge: 2017-10-25 | Disposition: A | Discharge status: Home / Self Care | Payer: Medicare Other | Source: Ambulatory Visit | Attending: Radiation Oncology | Admitting: Radiation Oncology

## 2017-10-25 DIAGNOSIS — C61 Malignant neoplasm of prostate: Secondary | ICD-10-CM | POA: Diagnosis not present

## 2017-10-26 ENCOUNTER — Ambulatory Visit: Payer: Medicare Other

## 2017-10-26 ENCOUNTER — Ambulatory Visit
Admission: RE | Admit: 2017-10-26 | Discharge: 2017-10-26 | Disposition: A | Discharge status: Home / Self Care | Payer: Medicare Other | Source: Ambulatory Visit | Attending: Radiation Oncology | Admitting: Radiation Oncology

## 2017-10-26 ENCOUNTER — Other Ambulatory Visit: Payer: Self-pay

## 2017-10-26 DIAGNOSIS — R300 Dysuria: Secondary | ICD-10-CM

## 2017-10-26 DIAGNOSIS — C61 Malignant neoplasm of prostate: Secondary | ICD-10-CM | POA: Diagnosis not present

## 2017-10-26 LAB — URINALYSIS, COMPLETE (UACMP) WITH MICROSCOPIC
BACTERIA UA: NONE SEEN
Bilirubin Urine: NEGATIVE
Glucose, UA: NEGATIVE mg/dL
Hgb urine dipstick: NEGATIVE
Ketones, ur: NEGATIVE mg/dL
Leukocytes, UA: NEGATIVE
Nitrite: NEGATIVE
PH: 6 (ref 5.0–8.0)
Protein, ur: NEGATIVE mg/dL
SPECIFIC GRAVITY, URINE: 1.004 — AB (ref 1.005–1.030)

## 2017-10-27 ENCOUNTER — Ambulatory Visit
Admission: RE | Admit: 2017-10-27 | Discharge: 2017-10-27 | Disposition: A | Discharge status: Home / Self Care | Payer: Medicare Other | Source: Ambulatory Visit | Attending: Radiation Oncology | Admitting: Radiation Oncology

## 2017-10-27 DIAGNOSIS — C61 Malignant neoplasm of prostate: Secondary | ICD-10-CM | POA: Diagnosis not present

## 2017-10-27 NOTE — Progress Notes (Signed)
Please call patient with normal result.  Thanks. MM

## 2017-10-28 ENCOUNTER — Ambulatory Visit
Admission: RE | Admit: 2017-10-28 | Discharge: 2017-10-28 | Disposition: A | Discharge status: Home / Self Care | Payer: Medicare Other | Source: Ambulatory Visit | Attending: Radiation Oncology | Admitting: Radiation Oncology

## 2017-10-28 DIAGNOSIS — C61 Malignant neoplasm of prostate: Secondary | ICD-10-CM | POA: Diagnosis not present

## 2017-10-28 LAB — URINE CULTURE: Culture: NO GROWTH

## 2017-10-29 ENCOUNTER — Telehealth: Payer: Self-pay | Admitting: Radiation Oncology

## 2017-10-29 NOTE — Telephone Encounter (Signed)
I spoke with the patient to make sure he knew his culture results were negative. He will try AZO if symptoms persist and continue Flomax. If he has more episodes of frequency and incomplete emptying, we discussed the options for off label BID dosing.

## 2017-10-31 ENCOUNTER — Ambulatory Visit
Admission: RE | Admit: 2017-10-31 | Discharge: 2017-10-31 | Disposition: A | Discharge status: Home / Self Care | Payer: Medicare Other | Source: Ambulatory Visit | Attending: Radiation Oncology | Admitting: Radiation Oncology

## 2017-10-31 DIAGNOSIS — C61 Malignant neoplasm of prostate: Secondary | ICD-10-CM | POA: Diagnosis not present

## 2017-11-01 ENCOUNTER — Ambulatory Visit
Admission: RE | Admit: 2017-11-01 | Discharge: 2017-11-01 | Disposition: A | Discharge status: Home / Self Care | Payer: Medicare Other | Source: Ambulatory Visit | Attending: Radiation Oncology | Admitting: Radiation Oncology

## 2017-11-01 DIAGNOSIS — C61 Malignant neoplasm of prostate: Secondary | ICD-10-CM | POA: Diagnosis not present

## 2017-11-02 ENCOUNTER — Telehealth: Payer: Self-pay | Admitting: Radiation Oncology

## 2017-11-02 ENCOUNTER — Other Ambulatory Visit: Payer: Self-pay | Admitting: Urology

## 2017-11-02 ENCOUNTER — Ambulatory Visit
Admission: RE | Admit: 2017-11-02 | Discharge: 2017-11-02 | Disposition: A | Discharge status: Home / Self Care | Payer: Medicare Other | Source: Ambulatory Visit | Attending: Radiation Oncology | Admitting: Radiation Oncology

## 2017-11-02 DIAGNOSIS — C61 Malignant neoplasm of prostate: Secondary | ICD-10-CM | POA: Diagnosis not present

## 2017-11-02 MED ORDER — URIBEL 118 MG PO CAPS: 1.0000 | ORAL_CAPSULE | Freq: Three times a day (TID) | ORAL | 1 refills | Status: DC | PRN

## 2017-11-02 MED ORDER — PHENAZOPYRIDINE HCL 200 MG PO TABS: 200.0000 mg | ORAL_TABLET | Freq: Three times a day (TID) | ORAL | 0 refills | Status: DC | PRN

## 2017-11-02 NOTE — Progress Notes (Signed)
Phoned patient as requested by Frederick Caldron, PA-C. Explained that Frederick Hall has been escribed to his preferred pharmacy to take instead of pyridium. Patient states, "it wasn't covered by insurance either so I got a ten day supply so if it doesn't work I am not out of a lot of money." Patient confirms he has already taken a tablet. Patient committed to keeping the staff posted on how he responds to this medication.

## 2017-11-02 NOTE — Progress Notes (Signed)
Received call from patient. He reports his insurance plan doesn't cover the pyridium script Ashlyn Bruning, PA-C gave him today. He goes onto say he is confused because the active ingredient of pyridium is the same as the AZO he is already taking. He confirms taking AZO 195 mg tid without relief. This patient understands this RN will consult Ashlyn and phone him back.

## 2017-11-03 ENCOUNTER — Ambulatory Visit
Admission: RE | Admit: 2017-11-03 | Discharge: 2017-11-03 | Disposition: A | Discharge status: Home / Self Care | Payer: Medicare Other | Source: Ambulatory Visit | Attending: Radiation Oncology | Admitting: Radiation Oncology

## 2017-11-03 DIAGNOSIS — C61 Malignant neoplasm of prostate: Secondary | ICD-10-CM | POA: Diagnosis not present

## 2017-11-04 ENCOUNTER — Ambulatory Visit
Admission: RE | Admit: 2017-11-04 | Discharge: 2017-11-04 | Disposition: A | Discharge status: Home / Self Care | Payer: Medicare Other | Source: Ambulatory Visit | Attending: Radiation Oncology | Admitting: Radiation Oncology

## 2017-11-04 DIAGNOSIS — C61 Malignant neoplasm of prostate: Secondary | ICD-10-CM | POA: Diagnosis not present

## 2017-11-07 ENCOUNTER — Telehealth: Payer: Self-pay | Admitting: Radiation Oncology

## 2017-11-07 ENCOUNTER — Ambulatory Visit
Admission: RE | Admit: 2017-11-07 | Discharge: 2017-11-07 | Disposition: A | Discharge status: Home / Self Care | Payer: Medicare Other | Source: Ambulatory Visit | Attending: Radiation Oncology | Admitting: Radiation Oncology

## 2017-11-07 DIAGNOSIS — C61 Malignant neoplasm of prostate: Secondary | ICD-10-CM | POA: Diagnosis not present

## 2017-11-07 NOTE — Progress Notes (Signed)
Received voicemail message from patient requesting return call. Phoned patient back promptly. Patient reports AZO isn't effective in treating his dysuria and Uribel hasn't made a significant difference. Patient questions if there is something else he can try. Patient scheduled for next radiation treatment tomorrow at 0900. Patient understands this RN will inform Ashlyn Bruning, PA-C of these findings and phone him back tomorrow with further directions.

## 2017-11-08 ENCOUNTER — Ambulatory Visit
Admission: RE | Admit: 2017-11-08 | Discharge: 2017-11-08 | Disposition: A | Discharge status: Home / Self Care | Payer: Medicare Other | Source: Ambulatory Visit | Attending: Radiation Oncology | Admitting: Radiation Oncology

## 2017-11-08 ENCOUNTER — Other Ambulatory Visit: Payer: Self-pay | Admitting: Urology

## 2017-11-08 ENCOUNTER — Telehealth: Payer: Self-pay | Admitting: Radiation Oncology

## 2017-11-08 DIAGNOSIS — C61 Malignant neoplasm of prostate: Secondary | ICD-10-CM | POA: Diagnosis not present

## 2017-11-08 MED ORDER — HYDROXYZINE HCL 25 MG PO TABS: 25.0000 mg | ORAL_TABLET | Freq: Every evening | ORAL | 0 refills | Status: DC | PRN

## 2017-11-08 NOTE — Telephone Encounter (Signed)
Phoned patient as directed by Freeman Caldron, PA-C. I relayed the following: a Rx for hydroxazine has been sent to his pharmacy- he will take 25mg  po at bedtime because it may cause drowsiness. Let's see if this, in combination with the Uribel and Flomax, provides more relief. This medication will specifically work to reduce inflammation. Also, informed patient that Dr. Tammi Klippel will see his under treat patients on Friday this week. Patient verbalized understanding of all reviewed.

## 2017-11-09 ENCOUNTER — Ambulatory Visit
Admission: RE | Admit: 2017-11-09 | Discharge: 2017-11-09 | Disposition: A | Discharge status: Home / Self Care | Payer: Medicare Other | Source: Ambulatory Visit | Attending: Radiation Oncology | Admitting: Radiation Oncology

## 2017-11-09 DIAGNOSIS — C61 Malignant neoplasm of prostate: Secondary | ICD-10-CM | POA: Diagnosis not present

## 2017-11-10 ENCOUNTER — Encounter: Payer: Self-pay | Admitting: Radiation Oncology

## 2017-11-10 ENCOUNTER — Ambulatory Visit
Admission: RE | Admit: 2017-11-10 | Discharge: 2017-11-10 | Disposition: A | Discharge status: Home / Self Care | Payer: Medicare Other | Source: Ambulatory Visit | Attending: Radiation Oncology | Admitting: Radiation Oncology

## 2017-11-10 DIAGNOSIS — C61 Malignant neoplasm of prostate: Secondary | ICD-10-CM | POA: Diagnosis not present

## 2017-11-11 ENCOUNTER — Ambulatory Visit
Admission: RE | Admit: 2017-11-11 | Discharge: 2017-11-11 | Disposition: A | Discharge status: Home / Self Care | Payer: Medicare Other | Source: Ambulatory Visit | Attending: Radiation Oncology | Admitting: Radiation Oncology

## 2017-11-11 DIAGNOSIS — C61 Malignant neoplasm of prostate: Secondary | ICD-10-CM | POA: Diagnosis not present

## 2017-11-14 ENCOUNTER — Ambulatory Visit
Admission: RE | Admit: 2017-11-14 | Discharge: 2017-11-14 | Disposition: A | Discharge status: Home / Self Care | Payer: Medicare Other | Source: Ambulatory Visit | Attending: Radiation Oncology | Admitting: Radiation Oncology

## 2017-11-14 DIAGNOSIS — C61 Malignant neoplasm of prostate: Secondary | ICD-10-CM | POA: Diagnosis not present

## 2017-11-15 ENCOUNTER — Ambulatory Visit
Admission: RE | Admit: 2017-11-15 | Discharge: 2017-11-15 | Disposition: A | Discharge status: Home / Self Care | Payer: Medicare Other | Source: Ambulatory Visit | Attending: Radiation Oncology | Admitting: Radiation Oncology

## 2017-11-15 DIAGNOSIS — C61 Malignant neoplasm of prostate: Secondary | ICD-10-CM | POA: Diagnosis not present

## 2017-11-16 ENCOUNTER — Ambulatory Visit
Admission: RE | Admit: 2017-11-16 | Discharge: 2017-11-16 | Disposition: A | Discharge status: Home / Self Care | Payer: Medicare Other | Source: Ambulatory Visit | Attending: Radiation Oncology | Admitting: Radiation Oncology

## 2017-11-16 DIAGNOSIS — C61 Malignant neoplasm of prostate: Secondary | ICD-10-CM | POA: Diagnosis not present

## 2017-11-17 ENCOUNTER — Ambulatory Visit
Admission: RE | Admit: 2017-11-17 | Discharge: 2017-11-17 | Disposition: A | Discharge status: Home / Self Care | Payer: Medicare Other | Source: Ambulatory Visit | Attending: Radiation Oncology | Admitting: Radiation Oncology

## 2017-11-17 DIAGNOSIS — C61 Malignant neoplasm of prostate: Secondary | ICD-10-CM | POA: Diagnosis not present

## 2017-11-18 ENCOUNTER — Ambulatory Visit
Admission: RE | Admit: 2017-11-18 | Discharge: 2017-11-18 | Disposition: A | Discharge status: Home / Self Care | Payer: Medicare Other | Source: Ambulatory Visit | Attending: Radiation Oncology | Admitting: Radiation Oncology

## 2017-11-18 DIAGNOSIS — C61 Malignant neoplasm of prostate: Secondary | ICD-10-CM | POA: Diagnosis not present

## 2017-11-20 NOTE — Progress Notes (Signed)
  Radiation Oncology         [phone] ________________________________  Name: Frederick Hall MRN: [id number]  Date: 11/10/2017  DOB: [birth year]  3D Planning Note   Prostate Brachytherapy Post-Implant Dosimetry  Diagnosis: 77 y.o. gentleman with high risk, Stage T1c adenocarcinoma of the prostate with Gleason Score of 4+4, and PSA of 7.94  Narrative: On a previous date, [name] returned following prostate seed implantation for post implant planning. He underwent CT scan complex simulation to delineate the three-dimensional structures of the pelvis and demonstrate the radiation distribution.  Since that time, the seed localization, and complex isodose planning with dose volume histograms have now been completed.  Results:   Prostate Coverage - The dose of radiation delivered to the 90% or more of the prostate gland (D90) was 127.3% of the prescription dose. This exceeds our goal of greater than 90%. Rectal Sparing - The volume of rectal tissue receiving the prescription dose or higher was 0.0 cc. This falls under our thresholds tolerance of 1.0 cc.  Impression: The prostate seed implant appears to show adequate target coverage and appropriate rectal sparing.  Plan:  The patient will continue to follow with urology for ongoing PSA determinations. I would anticipate a high likelihood for local tumor control with minimal risk for rectal morbidity.  ________________________________  Sheral Apley Tammi Klippel, M.D.

## 2017-11-21 ENCOUNTER — Ambulatory Visit
Admission: RE | Admit: 2017-11-21 | Discharge: 2017-11-21 | Disposition: A | Discharge status: Home / Self Care | Payer: Medicare Other | Source: Ambulatory Visit | Attending: Radiation Oncology | Admitting: Radiation Oncology

## 2017-11-21 DIAGNOSIS — C61 Malignant neoplasm of prostate: Secondary | ICD-10-CM | POA: Diagnosis not present

## 2017-11-22 ENCOUNTER — Ambulatory Visit
Admission: RE | Admit: 2017-11-22 | Discharge: 2017-11-22 | Disposition: A | Discharge status: Home / Self Care | Payer: Medicare Other | Source: Ambulatory Visit | Attending: Radiation Oncology | Admitting: Radiation Oncology

## 2017-11-22 DIAGNOSIS — C61 Malignant neoplasm of prostate: Secondary | ICD-10-CM | POA: Diagnosis not present

## 2017-11-23 ENCOUNTER — Ambulatory Visit
Admission: RE | Admit: 2017-11-23 | Discharge: 2017-11-23 | Disposition: A | Discharge status: Home / Self Care | Payer: Medicare Other | Source: Ambulatory Visit | Attending: Radiation Oncology | Admitting: Radiation Oncology

## 2017-11-23 DIAGNOSIS — C61 Malignant neoplasm of prostate: Secondary | ICD-10-CM | POA: Diagnosis not present

## 2017-11-23 LAB — GLUCOSE, POCT (MANUAL RESULT ENTRY): POC Glucose: 92 mg/dl (ref 70–99)

## 2017-11-24 ENCOUNTER — Ambulatory Visit
Admission: RE | Admit: 2017-11-24 | Discharge: 2017-11-24 | Disposition: A | Discharge status: Home / Self Care | Payer: Medicare Other | Source: Ambulatory Visit | Attending: Radiation Oncology | Admitting: Radiation Oncology

## 2017-11-24 DIAGNOSIS — C61 Malignant neoplasm of prostate: Secondary | ICD-10-CM | POA: Diagnosis not present

## 2017-11-25 ENCOUNTER — Ambulatory Visit
Admission: RE | Admit: 2017-11-25 | Discharge: 2017-11-25 | Disposition: A | Discharge status: Home / Self Care | Payer: Medicare Other | Source: Ambulatory Visit | Attending: Radiation Oncology | Admitting: Radiation Oncology

## 2017-11-25 DIAGNOSIS — C61 Malignant neoplasm of prostate: Secondary | ICD-10-CM | POA: Diagnosis not present

## 2017-11-28 ENCOUNTER — Ambulatory Visit
Admission: RE | Admit: 2017-11-28 | Discharge: 2017-11-28 | Disposition: A | Discharge status: Home / Self Care | Payer: Medicare Other | Source: Ambulatory Visit | Attending: Radiation Oncology | Admitting: Radiation Oncology

## 2017-11-28 DIAGNOSIS — C61 Malignant neoplasm of prostate: Secondary | ICD-10-CM | POA: Diagnosis not present

## 2017-12-02 ENCOUNTER — Telehealth: Payer: Self-pay | Admitting: Radiation Oncology

## 2017-12-02 ENCOUNTER — Other Ambulatory Visit: Payer: Self-pay | Admitting: Urology

## 2017-12-02 MED ORDER — PHENAZOPYRIDINE HCL 200 MG PO TABS: 200.0000 mg | ORAL_TABLET | Freq: Three times a day (TID) | ORAL | 0 refills | Status: DC | PRN

## 2017-12-02 NOTE — Telephone Encounter (Signed)
Received call from patient this morning requesting a refill of his generic pyridium. Phoned patient back at 1528. Informed him a refill has been escribed to [facility] [place]. Patient verbalized understanding and expressed appreciation for the assistance.

## 2017-12-06 ENCOUNTER — Encounter: Payer: Self-pay | Admitting: Radiation Oncology

## 2017-12-06 NOTE — Progress Notes (Signed)
  Radiation Oncology         [phone] ________________________________  Name: Frederick Hall MRN: [id number]  Date: 12/06/2017  DOB: April 07, 1940  End of Treatment Note  Diagnosis:   77 y.o. gentleman with high risk, Stage T1c adenocarcinoma of the prostate with Gleason Score of 4+4, and PSA of 7.94      Indication for treatment:  Curative       Radiation treatment dates:   09/29/17, 10/25/17 - 11/28/17  Site/dose:  The patient received 45 Gy in 25 fractions of 1.8 Gy, to supplement an up-front prostate seed implant boost of 110 Gy to achieve a total nominal dose of 165 Gy.  Beams/energy:    The patient was treated with IMRT using volumetric arc therapy delivering 6 MV X-rays to clockwise and counterclockwise circumferential arcs with a 90 degree collimator offset to avoid dose scalloping.  Image guidance was performed with daily cone beam CT prior to each fraction to align to gold markers in the prostate and assure proper bladder and rectal fill volumes.  Immobilization was achieved with BodyFix custom mold.  Narrative: The patient tolerated radiation treatment relatively well. Following seed implant, he reported back and leg pain. He received steroids from his orthopedic physician. During IMRT, he reported dysuria, fatigue, urgency, nocturia x5 and weak stream throughout treatment. He began reporting straining and hesitancy several weeks into treatment. Towards the end of treatment, he experienced heavy straining at night to void, difficulty emptying his bladder, abdominal bloating, and diarrhea.   Plan: The patient has completed radiation treatment. The patient will return to radiation oncology clinic for routine followup in one month. I advised him to call or return sooner if he has any questions or concerns related to his recovery or treatment. ________________________________  Sheral Apley. Tammi Klippel, M.D.   This document serves as a record of services personally performed by Tyler Pita, MD. It was created on his behalf by Wilburn Mylar, a trained medical scribe. The creation of this record is based on the scribe's personal observations and the provider's statements to them. This document has been checked and approved by the attending provider.

## 2018-01-12 ENCOUNTER — Other Ambulatory Visit: Payer: Self-pay

## 2018-01-12 ENCOUNTER — Encounter: Payer: Self-pay | Admitting: Urology

## 2018-01-12 ENCOUNTER — Ambulatory Visit
Admission: RE | Admit: 2018-01-12 | Discharge: 2018-01-12 | Disposition: A | Discharge status: Home / Self Care | Payer: Medicare Other | Source: Ambulatory Visit | Attending: Urology | Admitting: Urology

## 2018-01-12 VITALS — BP 152/98 | HR 69 | Temp 97.5°F | Resp 16 | Wt 197.4 lb

## 2018-01-12 DIAGNOSIS — C61 Malignant neoplasm of prostate: Secondary | ICD-10-CM | POA: Diagnosis not present

## 2018-01-12 DIAGNOSIS — Z923 Personal history of irradiation: Secondary | ICD-10-CM | POA: Diagnosis not present

## 2018-01-12 DIAGNOSIS — Z79899 Other long term (current) drug therapy: Secondary | ICD-10-CM | POA: Insufficient documentation

## 2018-01-12 NOTE — Progress Notes (Signed)
Radiation Oncology         [phone] ________________________________  Name: Frederick Hall MRN: [id number]  Date: 01/12/2018  DOB: Jun 24, 1940  Post Treatment Note  CC: Frederick Sacramento, MD  Franchot Gallo, MD  Diagnosis:   78 y.o. gentleman with high risk, Stage T1c adenocarcinoma of the prostate with Gleason Score of 4+4, and PSA of 7.94     Interval Since Last Radiation:  6 weeks  09/29/17, 10/25/17 - 11/28/17:  The patient received 45 Gy in 25 fractions of 1.8 Gy, to supplement an up-front prostate seed implant boost of 110 Gyto achieve a total nominal dose of 165 Gy.  Narrative:  The patient returns today for routine follow-up.  He tolerated radiation treatment relatively well. Following seed implant, he reported back and leg pain. He received steroids from his orthopedic physician. During IMRT, he reported dysuria, fatigue, urgency, nocturia x5 and weak stream throughout treatment. He began reporting straining and hesitancy several weeks into treatment. Towards the end of treatment, he experienced increased straining at night to void, difficulty emptying his bladder, nocturia every hour, dysuria, abdominal bloating, and diarrhea.                               On review of systems, the patient states that he is doing relatively well.  He continues to struggle with significant LUTS, particularly nocturia every hour, weak stream, straining to void and incomplete emptying.  He reports that he continues with persistent dysuria but that this is gradually improving.  He is no longer using Pyridium or Uribel as he did not experience much if any relief when using these medications.  He is continued using hydroxyzine at night in addition to Flomax.  He was recently seen at [facility] urology on 01/03/2018 for a repeat Lupron injection and had a PVR bladder scan at that time which confirmed that he is not completely emptying his bladder, but the PVR was not a significant amount to warrant  indwelling foley or CIC.  He has been using Flomax twice daily since that time and has not noticed any significant improvement unfortunately.  He continues to feel like he is sitting on a golf ball due to the persistent prostate swelling.  He denies any recent fevers, chills or night sweats.  He has not noted any gross hematuria and denies incontinence.  He denies abdominal pain, nausea, vomiting, diarrhea or constipation.  He reports a healthy appetite and is maintaining his weight.  ALLERGIES:  is allergic to penicillins and cefuroxime axetil.  Meds: Current Outpatient Medications  Medication Sig Dispense Refill  . albuterol (PROVENTIL HFA;VENTOLIN HFA) 108 (90 Base) MCG/ACT inhaler Inhale into the lungs every 6 (six) hours as needed for wheezing or shortness of breath.    . hydrOXYzine (ATARAX/VISTARIL) 25 MG tablet Take 1 tablet (25 mg total) by mouth at bedtime as needed (for dysuria). 30 tablet 0  . lisinopril (PRINIVIL,ZESTRIL) 20 MG tablet Take 20 mg by mouth every morning.    . Meth-Hyo-M Bl-Na Phos-Ph Sal (URIBEL) 118 MG CAPS Take 1 capsule (118 mg total) by mouth 3 (three) times daily as needed (as needed for urinary burning and frequency). 60 capsule 1  . Multiple Vitamin (MULTIVITAMIN) tablet Take 1 tablet by mouth daily.    . naproxen sodium (ALEVE) 220 MG tablet Take 220 mg by mouth as needed.    . phenazopyridine (PYRIDIUM) 200 MG tablet Take 1 tablet (200  mg total) by mouth 3 (three) times daily as needed for pain (for dysuria). 90 tablet 0  . ranitidine (ZANTAC) 150 MG tablet Take 150 mg by mouth every morning.    . saw palmetto 500 MG capsule Take 500 mg by mouth daily.    . tadalafil (ADCIRCA/CIALIS) 20 MG tablet Take as directed for E.D.    . tamsulosin (FLOMAX) 0.4 MG CAPS capsule TAKE 1 CAPSULE BY MOUTH EVERYDAY AT BEDTIME  11   No current facility-administered medications for this encounter.     Physical Findings:  weight is 197 lb 6.4 oz (89.5 kg). His oral temperature  is 97.5 F (36.4 C) (abnormal). His blood pressure is 152/98 (abnormal) and his pulse is 69. His respiration is 16 and oxygen saturation is 98%.  Pain Assessment Pain Score: 3 /10 In general this is a well appearing Caucasian male in no acute distress.  He's alert and oriented x4 and appropriate throughout the examination. Cardiopulmonary assessment is negative for acute distress and he exhibits normal effort.   Lab Findings: Lab Results  Component Value Date   WBC 5.4 09/22/2017   HGB 14.0 09/22/2017   HCT 43.8 09/22/2017   MCV 97.3 09/22/2017   PLT 187 09/22/2017     Radiographic Findings: No results found.  Impression/Plan: 1. 78 y.o. gentleman with high risk, Stage T1c adenocarcinoma of the prostate with Gleason Score of 4+4, and PSA of 7.94.   He will continue to follow up with urology for ongoing PSA determinations and has an appointment scheduled with Dr. Diona Fanti in Feb 2020.  He was recently seen on 01/03/2018 for a repeat Lupron injection and had a bladder ultrasound for PVR at that time which did show that he was retaining urine but not to a dangerous level requiring catheterization which he is pleased with.  He has been using Flomax twice daily since that time as advised, in addition to hydroxyzine and Pyridium, and has not noticed any significant improvement unfortunately.  He has a scheduled follow-up visit with Jiles Crocker, NP on January 18, 2018.  In the meantime, I have advised him to perform sitz bath's twice daily, use ibuprofen/Advil twice daily to help with the inflammation and increase his Flomax to 2 capsules nightly to see if this will help better manage his LUTS especially since his nighttime symptoms are the most bothersome.  He has not noticed significant benefit while taking hydroxyzine so we will discontinue this and see if that helps to improve the lower extremity edema that he has noted over the past several weeks.  He understands what to expect with regards to  PSA monitoring going forward. I will look forward to following his response to treatment via correspondence with urology, and would be happy to continue to participate in his care if clinically indicated. I talked to the patient about what to expect in the future, including his risk for erectile dysfunction and rectal bleeding. I encouraged him to call or return to the office if he has any questions regarding his previous radiation or possible radiation side effects. He was comfortable with this plan and will follow up as needed.    Nicholos Johns, PA-C

## 2018-01-18 ENCOUNTER — Other Ambulatory Visit: Payer: Self-pay | Admitting: *Deleted

## 2018-01-18 DIAGNOSIS — R911 Solitary pulmonary nodule: Secondary | ICD-10-CM

## 2018-01-18 LAB — GLUCOSE, POCT (MANUAL RESULT ENTRY): POC GLUCOSE: 91 mg/dL (ref 70–99)

## 2018-03-09 ENCOUNTER — Other Ambulatory Visit: Payer: Self-pay | Admitting: *Deleted

## 2018-03-09 ENCOUNTER — Ambulatory Visit
Admission: RE | Admit: 2018-03-09 | Discharge: 2018-03-09 | Disposition: A | Discharge status: Home / Self Care | Payer: Medicare Other | Source: Ambulatory Visit | Attending: Cardiothoracic Surgery | Admitting: Cardiothoracic Surgery

## 2018-03-09 ENCOUNTER — Ambulatory Visit (INDEPENDENT_AMBULATORY_CARE_PROVIDER_SITE_OTHER): Payer: Medicare Other | Admitting: Cardiothoracic Surgery

## 2018-03-09 VITALS — BP 129/85 | HR 73 | Resp 20 | Ht 69.0 in | Wt 195.0 lb

## 2018-03-09 DIAGNOSIS — R911 Solitary pulmonary nodule: Secondary | ICD-10-CM | POA: Diagnosis not present
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
## 2018-03-22 ENCOUNTER — Other Ambulatory Visit: Payer: Self-pay | Admitting: Urology

## 2018-03-29 ENCOUNTER — Encounter (HOSPITAL_BASED_OUTPATIENT_CLINIC_OR_DEPARTMENT_OTHER): Payer: Self-pay

## 2018-03-29 ENCOUNTER — Other Ambulatory Visit: Payer: Self-pay

## 2018-03-29 NOTE — Progress Notes (Signed)
Spoke with:  Juanda Crumble NPO:  After Midnight, no gum, candy, or mints  Arrival time:  0745AM Labs: BMP (EKG chart/epic) AM medications:  Pepcid Pre op orders:  Yes Ride home:  [name] (wife) [phone]

## 2018-04-03 ENCOUNTER — Ambulatory Visit (HOSPITAL_COMMUNITY): Payer: Medicare Other

## 2018-04-05 ENCOUNTER — Encounter (HOSPITAL_COMMUNITY): Payer: Self-pay | Admitting: Anesthesiology

## 2018-04-06 ENCOUNTER — Ambulatory Visit (HOSPITAL_COMMUNITY): Admission: RE | Admit: 2018-04-06 | Payer: Medicare Other | Source: Home / Self Care | Admitting: Urology

## 2018-04-06 HISTORY — DX: Benign prostatic hyperplasia without lower urinary tract symptoms: N40.0

## 2018-04-06 HISTORY — DX: Other seasonal allergic rhinitis: J30.2

## 2018-04-06 SURGERY — CYSTOSCOPY WITH INSERTION OF UROLIFT
Anesthesia: Monitor Anesthesia Care

## 2018-04-12 ENCOUNTER — Other Ambulatory Visit: Payer: Self-pay | Admitting: Student

## 2018-04-13 ENCOUNTER — Other Ambulatory Visit: Payer: Self-pay | Admitting: Radiology

## 2018-04-13 ENCOUNTER — Other Ambulatory Visit: Payer: Self-pay | Admitting: Student

## 2018-04-13 ENCOUNTER — Ambulatory Visit: Payer: Medicare Other | Admitting: Cardiothoracic Surgery

## 2018-04-14 ENCOUNTER — Encounter (HOSPITAL_COMMUNITY): Payer: Self-pay

## 2018-04-14 ENCOUNTER — Ambulatory Visit (HOSPITAL_COMMUNITY)
Admission: RE | Admit: 2018-04-14 | Discharge: 2018-04-14 | Disposition: A | Discharge status: Home / Self Care | Payer: Medicare Other | Source: Ambulatory Visit | Attending: Interventional Radiology | Admitting: Interventional Radiology

## 2018-04-14 ENCOUNTER — Other Ambulatory Visit: Payer: Self-pay

## 2018-04-14 ENCOUNTER — Ambulatory Visit (HOSPITAL_COMMUNITY)
Admission: RE | Admit: 2018-04-14 | Discharge: 2018-04-14 | Disposition: A | Discharge status: Home / Self Care | Payer: Medicare Other | Source: Ambulatory Visit | Attending: Cardiothoracic Surgery | Admitting: Cardiothoracic Surgery

## 2018-04-14 DIAGNOSIS — Z96643 Presence of artificial hip joint, bilateral: Secondary | ICD-10-CM | POA: Insufficient documentation

## 2018-04-14 DIAGNOSIS — N400 Enlarged prostate without lower urinary tract symptoms: Secondary | ICD-10-CM | POA: Insufficient documentation

## 2018-04-14 DIAGNOSIS — Z85828 Personal history of other malignant neoplasm of skin: Secondary | ICD-10-CM | POA: Insufficient documentation

## 2018-04-14 DIAGNOSIS — Z9889 Other specified postprocedural states: Secondary | ICD-10-CM | POA: Insufficient documentation

## 2018-04-14 DIAGNOSIS — J45909 Unspecified asthma, uncomplicated: Secondary | ICD-10-CM | POA: Insufficient documentation

## 2018-04-14 DIAGNOSIS — R911 Solitary pulmonary nodule: Secondary | ICD-10-CM

## 2018-04-14 DIAGNOSIS — Z881 Allergy status to other antibiotic agents status: Secondary | ICD-10-CM | POA: Diagnosis not present

## 2018-04-14 DIAGNOSIS — R918 Other nonspecific abnormal finding of lung field: Secondary | ICD-10-CM | POA: Diagnosis present

## 2018-04-14 DIAGNOSIS — Z87891 Personal history of nicotine dependence: Secondary | ICD-10-CM | POA: Diagnosis not present

## 2018-04-14 DIAGNOSIS — Z8546 Personal history of malignant neoplasm of prostate: Secondary | ICD-10-CM | POA: Insufficient documentation

## 2018-04-14 DIAGNOSIS — Z880 Allergy status to penicillin: Secondary | ICD-10-CM | POA: Insufficient documentation

## 2018-04-14 DIAGNOSIS — I10 Essential (primary) hypertension: Secondary | ICD-10-CM | POA: Diagnosis not present

## 2018-04-14 DIAGNOSIS — Z809 Family history of malignant neoplasm, unspecified: Secondary | ICD-10-CM | POA: Insufficient documentation

## 2018-04-14 DIAGNOSIS — Z91048 Other nonmedicinal substance allergy status: Secondary | ICD-10-CM | POA: Insufficient documentation

## 2018-04-14 DIAGNOSIS — Z79899 Other long term (current) drug therapy: Secondary | ICD-10-CM | POA: Diagnosis not present

## 2018-04-14 LAB — CBC
HCT: 40.4 % (ref 39.0–52.0)
Hemoglobin: 13.4 g/dL (ref 13.0–17.0)
MCH: 31.2 pg (ref 26.0–34.0)
MCHC: 33.2 g/dL (ref 30.0–36.0)
MCV: 94.2 fL (ref 80.0–100.0)
Platelets: 166 10*3/uL (ref 150–400)
RBC: 4.29 MIL/uL (ref 4.22–5.81)
RDW: 14.7 % (ref 11.5–15.5)
WBC: 4.7 10*3/uL (ref 4.0–10.5)
nRBC: 0 % (ref 0.0–0.2)

## 2018-04-14 LAB — PROTIME-INR
INR: 1 (ref 0.8–1.2)
Prothrombin Time: 12.8 seconds (ref 11.4–15.2)

## 2018-04-14 MED ORDER — LIDOCAINE-EPINEPHRINE 1 %-1:100000 IJ SOLN
INTRAMUSCULAR | Status: AC
  Filled 2018-04-14: qty 1

## 2018-04-14 MED ORDER — FENTANYL CITRATE (PF) 100 MCG/2ML IJ SOLN
INTRAMUSCULAR | Status: AC | PRN
  Administered 2018-04-14: 25 ug via INTRAVENOUS

## 2018-04-14 MED ORDER — MIDAZOLAM HCL 2 MG/2ML IJ SOLN
INTRAMUSCULAR | Status: AC
  Filled 2018-04-14: qty 2

## 2018-04-14 MED ORDER — FENTANYL CITRATE (PF) 100 MCG/2ML IJ SOLN
INTRAMUSCULAR | Status: AC
  Filled 2018-04-14: qty 2

## 2018-04-14 MED ORDER — MIDAZOLAM HCL 2 MG/2ML IJ SOLN
INTRAMUSCULAR | Status: AC | PRN
  Administered 2018-04-14: 0.5 mg via INTRAVENOUS

## 2018-04-14 NOTE — Progress Notes (Signed)
PIV removed and dressing applied.  Discharge instructions discussed with patient and written after visit summary given to patient.  All questions answered.  No complications with procedure or during recovery.  Pt is discharged to home.

## 2018-04-14 NOTE — Discharge Instructions (Addendum)
Needle Biopsy of the Lung, Care After °This sheet gives you information about how to care for yourself after your procedure. Your health care provider may also give you more specific instructions. If you have problems or questions, contact your health care provider. °What can I expect after the procedure? °After the procedure, it is common to have: °· Soreness, pain, and tenderness where a tissue sample was taken (biopsy site). °· A cough. °· A sore throat. °Follow these instructions at home: °Biopsy site care °· Follow instructions from your health care provider about when to remove the bandage that was placed on the biopsy site. °· Keep the bandage dry until it has been removed. °· Check your biopsy site every day for signs of infection. Check for: °? More redness, swelling, or pain. °? More fluid or blood. °? Warmth to the touch. °? Pus or a bad smell. °General instructions ° °· Rest as directed by your health care provider. Ask your health care provider what activities are safe for you. °· Do not take baths, swim, or use a hot tub until your health care provider approves. °· Take over-the-counter and prescription medicines only as told by your health care provider. °· If you have airplane travel scheduled, talk with your health care provider about when it is safe for you to travel by airplane. °· It is up to you to get the results of your procedure. Ask your health care provider, or the department that is doing the procedure, when your results will be ready. °· Keep all follow-up visits as told by your health care provider. This is important. °Contact a health care provider if: °· You have more redness, swelling, or pain around your biopsy site. °· You have more fluid or blood coming from your biopsy site. °· Your biopsy site feels warm to the touch. °· You have pus or a bad smell coming from your biopsy site. °· You have a fever. °· You have pain that does not get better with medicine. °Get help right away  if: °· You have problems breathing. °· You have chest pain. °· You cough up blood. °· You faint. °· You have a fast heart rate. °Summary °· After a needle biopsy of the lung, it is common to have a cough, a sore throat, or soreness, pain, and tenderness where a tissue sample was taken (biopsy site). °· You should check your biopsy area every day for signs of infection, including pus or a bad smell, warmth, more fluid or blood, or more redness, swelling, or pain. °· You should not take baths, swim, or use a hot tub until your health care provider approves. °· It is up to you to get the results of your procedure. Ask your health care provider, or the department that is doing the procedure, when your results will be ready. °This information is not intended to replace advice given to you by your health care provider. Make sure you discuss any questions you have with your health care provider. °Document Released: 10/25/2006 Document Revised: 11/19/2015 Document Reviewed: 11/19/2015 °Elsevier Interactive Patient Education © 2019 [employer] ° ° ° °Moderate Conscious Sedation, Adult, Care After °These instructions provide you with information about caring for yourself after your procedure. Your health care provider may also give you more specific instructions. Your treatment has been planned according to current medical practices, but problems sometimes occur. Call your health care provider if you have any problems or questions after your procedure. °What can I expect   after the procedure? °After your procedure, it is common: °· To feel sleepy for several hours. °· To feel clumsy and have poor balance for several hours. °· To have poor judgment for several hours. °· To vomit if you eat too soon. °Follow these instructions at home: °For at least 24 hours after the procedure: ° °· Do not: °? Participate in activities where you could fall or become injured. °? Drive. °? Use heavy machinery. °? Drink alcohol. °? Take sleeping  pills or medicines that cause drowsiness. °? Make important decisions or sign legal documents. °? Take care of children on your own. °· Rest. °Eating and drinking °· Follow the diet recommended by your health care provider. °· If you vomit: °? Drink water, juice, or soup when you can drink without vomiting. °? Make sure you have little or no nausea before eating solid foods. °General instructions °· Have a responsible adult stay with you until you are awake and alert. °· Take over-the-counter and prescription medicines only as told by your health care provider. °· If you smoke, do not smoke without supervision. °· Keep all follow-up visits as told by your health care provider. This is important. °Contact a health care provider if: °· You keep feeling nauseous or you keep vomiting. °· You feel light-headed. °· You develop a rash. °· You have a fever. °Get help right away if: °· You have trouble breathing. °This information is not intended to replace advice given to you by your health care provider. Make sure you discuss any questions you have with your health care provider. °Document Released: 10/18/2012 Document Revised: 06/02/2015 Document Reviewed: 04/19/2015 °Elsevier Interactive Patient Education © 2019 [employer] ° ° °

## 2018-04-14 NOTE — Procedures (Signed)
Pre procedural Dx: Right lower lobe pulmonary nodule  Post procedural Dx: Same  Technically successful CT guided biopsy of indeterminate right lower lobe ground glass nodule.    EBL: None.   Complications: None immediate.   Ronny Bacon, MD Pager #: [phone]

## 2018-04-14 NOTE — Consult Note (Signed)
Chief Complaint: Patient was seen in consultation today for right lung mass biopsy at the request of Gerhardt,Edward B  Referring Physician(s): [name]  Supervising Physician: Sandi Mariscal  Patient Status: [facility] - In-pt  History of Present Illness: Frederick Hall is a 78 y.o. male   Dr Servando Snare note 03/09/18:  The patient was recently diagnosed with prostate cancer, June 2019 CT of the abdomen was performed indicating a groundglass opacity 1.4 to 1.5 cm in size in the right lower lobe, to follow-up on this a CT scan of the chest was performed.   CT 2/27: IMPRESSION: 1. Solid and sub solid ground-glass nodule in the RIGHT lower lobe is not changed in size from comparison exam however the superior nodular component appears more prominent. Consider tissue sampling of this suspicious morphology versus continued CT surveillance. 2. No lymphadenopathy.  Pt denies SOB; cough   Now scheduled for biopsy of Right lung nodule  Past Medical History:  Diagnosis Date  . Arthritis   . Asthma 2005   "attack x 1"  . BPH (benign prostatic hyperplasia)   . Erectile dysfunction   . GERD (gastroesophageal reflux disease)   . Headache    "Stabbing"  . Hematuria 12/2016  . History of acute prostatitis 12/2016  . Hypertension   . Lipoma 02/08/2014   Small subcutaneous mass right temporal region, noted on MRI Brain, pt unaware  . Nodule of right lung 08/26/2017   72mm ground glass posterior nodule, noted on CT chest  . Osteoarthritis of left hip 08/07/2012  . Osteoarthritis of right hip 05/23/2012  . Prostate cancer ([place])   . Renal cyst, right 06/27/2017   Large peripelvic, noted on [facility] Bone scan  . Seasonal allergies   . Skin cancer   . Wears hearing aid     Past Surgical History:  Procedure Laterality Date  . COLONOSCOPY  09/2017  . CYSTOSCOPY N/A 09/29/2017   Procedure: CYSTOSCOPY;  Surgeon: Franchot Gallo, MD;  Location: [facility];  Service:  Urology;  Laterality: N/A;  no seeds in bladder per Dr Diona Fanti  . KNEE ARTHROSCOPY Left   . LIPOMA EXCISION     multiple  . RADIOACTIVE SEED IMPLANT N/A 09/29/2017   Procedure: RADIOACTIVE SEED IMPLANT/BRACHYTHERAPY IMPLANT;  Surgeon: Franchot Gallo, MD;  Location: [facility];  Service: Urology;  Laterality: N/A;  63 seeds  . SHOULDER ARTHROSCOPY Right   . SPACE OAR INSTILLATION N/A 09/29/2017   Procedure: SPACE OAR INSTILLATION;  Surgeon: Franchot Gallo, MD;  Location: [facility];  Service: Urology;  Laterality: N/A;  . TONSILLECTOMY    . TOTAL HIP ARTHROPLASTY Right 05/23/2012   Procedure: TOTAL HIP ARTHROPLASTY;  Surgeon: Johnny Bridge, MD;  Location: [facility];  Service: Orthopedics;  Laterality: Right;  . TOTAL HIP ARTHROPLASTY Left 08/07/2012   Procedure: TOTAL HIP ARTHROPLASTY- left ;  Surgeon: Johnny Bridge, MD;  Location: [facility];  Service: Orthopedics;  Laterality: Left;    Allergies: Penicillins; Cefuroxime axetil; and Pollen extract  Medications: Prior to Admission medications   Medication Sig Start Date End Date Taking? Authorizing Provider  albuterol (PROVENTIL HFA;VENTOLIN HFA) 108 (90 Base) MCG/ACT inhaler Inhale into the lungs every 6 (six) hours as needed for wheezing or shortness of breath.    [provider]  CALCIUM PO Take 1 tablet by mouth daily.    [provider]  famotidine (PEPCID) 20 MG tablet Take 20 mg by mouth daily before breakfast.  [provider]  hydrochlorothiazide (MICROZIDE) 12.5 MG capsule Take 12.5 mg by mouth daily.    [provider]  leuprolide (LUPRON) 7.5 MG injection Inject 7.5 mg into the muscle every 4 (four) months.     [provider]  lisinopril (PRINIVIL,ZESTRIL) 20 MG tablet Take 20 mg by mouth every morning.    [provider]  Multiple Vitamin (MULTIVITAMIN WITH MINERALS) TABS tablet Take 1 tablet by mouth daily.    [provider]   naproxen sodium (ALEVE) 220 MG tablet Take 220 mg by mouth 2 (two) times daily as needed (heavy activity (golf, exercise)).     [provider]  tadalafil (ADCIRCA/CIALIS) 20 MG tablet Take 20 mg by mouth daily as needed for erectile dysfunction.  02/11/17   [provider]  tamsulosin (FLOMAX) 0.4 MG CAPS capsule Take 0.4 mg by mouth every evening.  11/16/17   [provider]     Family History  Problem Relation Age of Onset  . Cancer Mother   . Cancer Father     Social History   Socioeconomic History  . Marital status: Married    Spouse name: [name]  . Number of children: Not on file  . Years of education: college  . Highest education level: Not on file  Occupational History  . Occupation: retired  Scientific laboratory technician  . Financial resource strain: Not on file  . Food insecurity:    Worry: Not on file    Inability: Not on file  . Transportation needs:    Medical: No    Non-medical: No  Tobacco Use  . Smoking status: Former Smoker    Packs/day: 1.00    Years: 15.00    Pack years: 15.00    Types: Cigarettes    Last attempt to quit: 05/18/1972    Years since quitting: 45.9  . Smokeless tobacco: Never Used  . Tobacco comment: social alcohol  Substance and Sexual Activity  . Alcohol use: Yes    Alcohol/week: 7.0 standard drinks    Types: 7 Standard drinks or equivalent per week    Comment: 3  wine or beer  . Drug use: No  . Sexual activity: Yes  Lifestyle  . Physical activity:    Days per week: Not on file    Minutes per session: Not on file  . Stress: Not on file  Relationships  . Social connections:    Talks on phone: Not on file    Gets together: Not on file    Attends religious service: Not on file    Active member of club or organization: Not on file    Attends meetings of clubs or organizations: Not on file    Relationship status: Not on file  Other Topics Concern  . Not on file  Social History Narrative   Lives at home with his wife     Drink 2-3 cups of coffee a day    Review of Systems: A 12 point ROS discussed and pertinent positives are indicated in the HPI above.  All other systems are negative.  Review of Systems  Constitutional: Negative for activity change, fatigue and fever.  Respiratory: Negative for cough and shortness of breath.   Cardiovascular: Negative for chest pain.  Gastrointestinal: Negative for abdominal pain.  Neurological: Negative for weakness.  Psychiatric/Behavioral: Negative for behavioral problems and confusion.    Vital Signs: BP 135/86 (BP Location: Left Arm)   Pulse 65   Temp 98 F (36.7 C) (Oral)  Resp 15   SpO2 99%   Physical Exam Vitals signs reviewed.  Cardiovascular:     Rate and Rhythm: Normal rate and regular rhythm.     Heart sounds: Normal heart sounds.  Pulmonary:     Breath sounds: Normal breath sounds.  Musculoskeletal: Normal range of motion.  Skin:    General: Skin is warm and dry.  Neurological:     Mental Status: He is alert and oriented to person, place, and time.  Psychiatric:        Mood and Affect: Mood normal.        Behavior: Behavior normal.        Thought Content: Thought content normal.        Judgment: Judgment normal.     Imaging: No results found.  Labs:  CBC: Recent Labs    09/22/17 0951 04/14/18 0941  WBC 5.4 4.7  HGB 14.0 13.4  HCT 43.8 40.4  PLT 187 166    COAGS: Recent Labs    09/22/17 0951 04/14/18 0941  INR 0.90 1.0  APTT 30  --     BMP: Recent Labs    09/22/17 0951  NA 141  K 4.3  CL 104  CO2 28  GLUCOSE 104*  BUN 17  CALCIUM 9.3  CREATININE 0.78  GFRNONAA >60  GFRAA >60    LIVER FUNCTION TESTS: Recent Labs    09/22/17 0951  BILITOT 0.7  AST 28  ALT 30  ALKPHOS 59  PROT 6.4*  ALBUMIN 3.6    TUMOR MARKERS: No results for input(s): AFPTM, CEA, CA199, CHROMGRNA in the last 8760 hours.  Assessment and Plan:  Hx Prostate ca RLL ground glass nodule incidentally found in work up for  prostate cancer Repeat CT shows persistent nodule Dr Servando Snare requesting biopsy Risks and benefits discussed with the patient including, but not limited to bleeding, hemoptysis, respiratory failure requiring intubation, infection, pneumothorax requiring chest tube placement, stroke from air embolism or even death.  All of the patient's questions were answered, patient is agreeable to proceed. Consent signed and in chart.  Thank you for this interesting consult.  I greatly enjoyed meeting Frederick Hall and look forward to participating in their care.  A copy of this report was sent to the requesting provider on this date.  Electronically Signed: Lavonia Drafts, PA-C 04/14/2018, 10:31 AM   I spent a total of  30 Minutes   in face to face in clinical consultation, greater than 50% of which was counseling/coordinating care for right lung nodule bx

## 2018-04-14 NOTE — Sedation Documentation (Signed)
Pt in CT 3, positioned prone on CT table with areas padded for comfort.  Pt placed on 2L O2 via [place] for procedure.  Pt placed on continuous cardiac monitoring

## 2018-04-19 ENCOUNTER — Telehealth: Payer: Self-pay | Admitting: Cardiothoracic Surgery

## 2018-04-19 NOTE — Telephone Encounter (Signed)
[address]       [place] [address]             [phone]                    Rubert S Blansett [place] Medical Record #[id number] Date of Birth: [birth year]  Referring: Franchot Gallo, MD Primary Care: [name], MD Primary Cardiologist: No primary care provider on file.   Chief Complaint  Patient presents with  . Lung Lesion         I contacted Frederick Hall remotely due to the limitations of the current COVID pandemic on 04/19/2018  at  12:00 PM  verifying that I was speaking to Frederick Hall whose birthday is 1940-07-03.   I discussed limitations of the evaluation and management  of patients remotely without  the benefit of physical exam.  The patient was agreeable with proceeding with a remote/ not face to face visit.      History of Present Illness:    Frederick Hall 78 y.o. male  Had been   for evaluation of groundglass opacity in the right lower lung in the past. After being seen in late February I recommended to him that we proceed with CT directed needle biopsy of the lesion he wanted to delay this for several weeks.  A CT directed needle biopsy biopsy was done earlier this week , the final report the patient confirmed   Diagnosis Lung, needle/core biopsy(ies), Right lower lobe - ADENOCARCINOMA WITH A LEPIDIC PATTERN. SEE NOTE Diagnosis Note The lesion shows a predominantly lepidic (in situ) pattern. Definite evidence of invasion is not seen in the submitted biopsies but cannot be ruled out. Dr. Melina Copa has reviewed this case and concurs with the above diagnosis. Dr. Servando Snare was paged on 04/18/2018 Jaquita Folds MD   The patient is a distant smoker having smoked for 10 to 12 years but he stopped smoking in 1976.  The patient was diagnosed with prostate cancer, June 2019 CT of the abdomen was performed indicating a groundglass opacity 1.4 to 1.5 cm in size in the right lower lobe, to follow-up on this a CT scan of the  chest was performed.  The patient was referred by Dr. Diona Fanti.  Patient denies any respiratory symptoms, denies hemoptysis.  He has undergone treatment for his prostate cancer with prostate seed plantation and also external beam radiation to the prostate.  He has had difficulty with frequent urination and is planning further urologic procedure in the near future.   He has no significant occupational respiratory exposures that he is aware of.    Current Activity/ Functional Status:  Patient is independent with mobility/ambulation, transfers, ADL's, IADL's.   Zubrod Score: At the time of surgery this patient's most appropriate activity status/level should be described as: [x]     0    Normal activity, no symptoms []     1    Restricted in physical strenuous activity but ambulatory, able to do out light work []     2    Ambulatory and capable of self care, unable to do work activities, up and about               >50 % of waking hours                              []     3  Only limited self care, in bed greater than 50% of waking hours []     4    Completely disabled, no self care, confined to bed or chair []     5    Moribund   Past Medical History:  Diagnosis Date  . Arthritis   . Asthma 2005   "attack x 1"  . BPH (benign prostatic hyperplasia)   . Erectile dysfunction   . GERD (gastroesophageal reflux disease)   . Headache    "Stabbing"  . Hematuria 12/2016  . History of acute prostatitis 12/2016  . Hypertension   . Lipoma 02/08/2014   Small subcutaneous mass right temporal region, noted on MRI Brain, pt unaware  . Nodule of right lung 08/26/2017   51mm ground glass posterior nodule, noted on CT chest  . Osteoarthritis of left hip 08/07/2012  . Osteoarthritis of right hip 05/23/2012  . Prostate cancer ([place])   . Renal cyst, right 06/27/2017   Large peripelvic, noted on [facility] Bone scan  . Seasonal allergies   . Skin cancer   . Wears hearing aid     Past Surgical History:   Procedure Laterality Date  . COLONOSCOPY  09/2017  . CYSTOSCOPY N/A 09/29/2017   Procedure: CYSTOSCOPY;  Surgeon: Franchot Gallo, MD;  Location: [facility];  Service: Urology;  Laterality: N/A;  no seeds in bladder per Dr Diona Fanti  . KNEE ARTHROSCOPY Left   . LIPOMA EXCISION     multiple  . RADIOACTIVE SEED IMPLANT N/A 09/29/2017   Procedure: RADIOACTIVE SEED IMPLANT/BRACHYTHERAPY IMPLANT;  Surgeon: Franchot Gallo, MD;  Location: [facility];  Service: Urology;  Laterality: N/A;  63 seeds  . SHOULDER ARTHROSCOPY Right   . SPACE OAR INSTILLATION N/A 09/29/2017   Procedure: SPACE OAR INSTILLATION;  Surgeon: Franchot Gallo, MD;  Location: [facility];  Service: Urology;  Laterality: N/A;  . TONSILLECTOMY    . TOTAL HIP ARTHROPLASTY Right 05/23/2012   Procedure: TOTAL HIP ARTHROPLASTY;  Surgeon: Johnny Bridge, MD;  Location: [facility];  Service: Orthopedics;  Laterality: Right;  . TOTAL HIP ARTHROPLASTY Left 08/07/2012   Procedure: TOTAL HIP ARTHROPLASTY- left ;  Surgeon: Johnny Bridge, MD;  Location: [facility];  Service: Orthopedics;  Laterality: Left;    Family History  Problem Relation Age of Onset  . Cancer Mother   . Cancer Father      Social History   Tobacco Use  Smoking Status Former Smoker  . Packs/day: 1.00  . Years: 15.00  . Pack years: 15.00  . Types: Cigarettes  . Last attempt to quit: 05/18/1972  . Years since quitting: 45.9  Smokeless Tobacco Never Used  Tobacco Comment   social alcohol    Social History   Substance and Sexual Activity  Alcohol Use Yes  . Alcohol/week: 7.0 standard drinks  . Types: 7 Standard drinks or equivalent per week   Comment: 3  wine or beer     Allergies  Allergen Reactions  . Penicillins Anaphylaxis, Itching and Rash    Childhood allergy. Tolerated cefazolin 05/23/12. Did it involve swelling of the face/tongue/throat, SOB, or low BP? Yes Did it involve sudden or severe  rash/hives, skin peeling, or any reaction on the inside of your mouth or nose? Yes Did you need to seek medical attention at a hospital or doctor's office? Yes When did it last happen?Childhood reaction type  If all above answers are "NO", may proceed with cephalosporin use.   . Cefuroxime Axetil  Diarrhea  . Pollen Extract Itching    Seasonal allergies.    Current Outpatient Medications  Medication Sig Dispense Refill  . albuterol (PROVENTIL HFA;VENTOLIN HFA) 108 (90 Base) MCG/ACT inhaler Inhale into the lungs every 6 (six) hours as needed for wheezing or shortness of breath.    Marland Kitchen CALCIUM PO Take 1 tablet by mouth daily.    . famotidine (PEPCID) 20 MG tablet Take 20 mg by mouth daily before breakfast.     . hydrochlorothiazide (MICROZIDE) 12.5 MG capsule Take 12.5 mg by mouth daily.    Marland Kitchen leuprolide (LUPRON) 7.5 MG injection Inject 7.5 mg into the muscle every 4 (four) months.     Marland Kitchen lisinopril (PRINIVIL,ZESTRIL) 20 MG tablet Take 20 mg by mouth every morning.    . Multiple Vitamin (MULTIVITAMIN WITH MINERALS) TABS tablet Take 1 tablet by mouth daily.    . naproxen sodium (ALEVE) 220 MG tablet Take 220 mg by mouth 2 (two) times daily as needed (heavy activity (golf, exercise)).     Marland Kitchen tadalafil (ADCIRCA/CIALIS) 20 MG tablet Take 20 mg by mouth daily as needed for erectile dysfunction.     . tamsulosin (FLOMAX) 0.4 MG CAPS capsule Take 0.4 mg by mouth every evening.   11   No current facility-administered medications for this visit.     Pertinent items are noted in HPI.   Review of Systems:     Cardiac Review of Systems: [Y] = yes  or   [ N ] = no   Chest Pain [ n]  Resting SOB [ n ] Exertional SOB  [  n]  Orthopnea [n   Pedal Edema [ n]    Palpitations n ] Syncope  [ n   Presyncope [n]   General Review of Systems: [Y] = yes [  ]=no Constitional: recent weight change [  ];  Wt loss over the last 3 months [   ] anorexia [  ]; fatigue [  ]; nausea [  ]; night sweats [  ]; fever [  ];  or chills [  ];           Eye : blurred vision [  ]; diplopia [   ]; vision changes [  ];  Amaurosis fugax[  ]; Resp: cough [  ];  wheezing[  ];  hemoptysis[  ]; shortness of breath[  ]; paroxysmal nocturnal dyspnea[  ]; dyspnea on exertion[  ]; or orthopnea[  ];  GI:  gallstones[  ], vomiting[  ];  dysphagia[  ]; melena[  ];  hematochezia [  ]; heartburn[  ];   Hx of  Colonoscopy[  ]; GU: kidney stones [  ]; hematuria[  ];   dysuria [  ];  nocturia[  ];  history of     obstruction [  ]; urinary frequency [  ]             Skin: rash, swelling[  ];, hair loss[  ];  peripheral edema[  ];  or itching[  ]; Musculosketetal: myalgias[  ];  joint swelling[  ];  joint erythema[  ];  joint pain[  ];  back pain[  ];  Heme/Lymph: bruising[  ];  bleeding[  ];  anemia[  ];  Neuro: TIA[  ];  headaches[  ];  stroke[  ];  vertigo[  ];  seizures[  ];   paresthesias[  ];  difficulty walking[  ];  Psych:depression[  ]; anxiety[  ];  Endocrine: diabetes[  ];  thyroid dysfunction[  ];  Immunizations: Flu up to date [y]; Pneumococcal up to date [ y ];  Other:     PHYSICAL EXAMINATION:  Diagnostic Studies & Laboratory data:     Recent Radiology Findings:   Ct Super D Chest Wo Contrast  Result Date: 03/09/2018 CLINICAL DATA:  Indeterminate RIGHT lower lobe ground-glass nodule. EXAM: CT CHEST WITHOUT CONTRAST TECHNIQUE: Multidetector CT imaging of the chest was performed using thin slice collimation for electromagnetic bronchoscopy planning purposes, without intravenous contrast. COMPARISON:  CT 08/04/2017 FINDINGS: Cardiovascular: No significant vascular findings. Normal heart size. No pericardial effusion. Mediastinum/Nodes: No axillary supraclavicular adenopathy. No mediastinal adenopathy. Pericardial effusion. Esophagus. Lungs/Pleura: In the RIGHT lower lobe, peripheral ground-glass nodule measures 21 x 14 mm (image 79/8) compared to 22 by 15 mm on prior remeasured for no change. There is a small solid nodular  component more superiorly along the pleural surface (image 77/8). This superior nodular component appears more prominent on the coronal imaging (image 153/4). No additional suspicious pulmonary nodules. Upper Abdomen: Limited view of the liver, kidneys, pancreas are unremarkable. Normal adrenal glands. Musculoskeletal: No aggressive osseous lesion IMPRESSION: 1. Solid and sub solid ground-glass nodule in the RIGHT lower lobe is not changed in size from comparison exam however the superior nodular component appears more prominent. Consider tissue sampling of this suspicious morphology versus continued CT surveillance. 2. No lymphadenopathy. Electronically Signed   By: Suzy Bouchard M.D.   On: 03/09/2018 14:38      Final Report  CLINICAL DATA: Lung nodules on previous abdomen CT.  EXAM: CT CHEST WITHOUT CONTRAST  TECHNIQUE: Multidetector CT imaging of the chest was performed following the standard protocol without IV contrast.  COMPARISON: Abdomen and pelvis CT 06/27/2017  FINDINGS: Cardiovascular: The heart size is normal. No substantial pericardial effusion. Atherosclerotic calcification is noted in the wall of the thoracic aorta.  Mediastinum/Nodes: No mediastinal lymphadenopathy. There is no hilar lymphadenopathy. The esophagus has normal imaging features. There is no axillary lymphadenopathy.  Lungs/Pleura: The central tracheobronchial airways are patent. 6 mm perifissural nodule right lung (3:65) likely subpleural lymph node. Similar appearance 18 mm ground-glass nodule posterior right lower lung (3:60). 4 mm nodule posterior right costophrenic sulcus is stable. There is atelectasis in the dependent lung bases bilaterally.  Upper Abdomen: Right renal cysts incompletely visualized.  Musculoskeletal: Unremarkable  IMPRESSION: 1. No change 18 mm ground-glass nodule posterior right lung. Adenocarcinoma cannot be excluded. Follow up by CT is recommended in 12 months, with  continued annual surveillance for a minimum of 3 years. These recommendations are taken from:Recommendations for the Management of Subsolid Pulmonary Nodules Detected at CT: A Statement from the [place] [facility] 2013; 266:1, 304-317. 2. Stable 4 mm posterior right lower lobe pulmonary nodule. Attention on follow-up.   Electronically Signed By: Misty Stanley M.D. On: 08/05/2017 10:50  Final Report  CLINICAL DATA: Prostate cancer.  EXAM: CT ABDOMEN AND PELVIS WITHOUT AND WITH CONTRAST  TECHNIQUE: Multidetector CT imaging of the abdomen and pelvis was performed following the standard protocol before and following the bolus administration of intravenous contrast.  CONTRAST: 100 cc Isovue 300.  COMPARISON: No comparison studies available.  FINDINGS: Lower chest: 4 mm nodule identified posterior right costophrenic sulcus (image 22/series 9). 17 mm ground-glass nodule noted right lower lobe (image 3/series 9).  Hepatobiliary: No focal abnormality within the liver parenchyma. There is no evidence for gallstones, gallbladder wall thickening, or pericholecystic fluid. No intrahepatic or extrahepatic biliary dilation.  Pancreas: No focal mass lesion. No  dilatation of the main duct. No intraparenchymal cyst. No peripancreatic edema.  Spleen: No splenomegaly. No focal mass lesion.  Adrenals/Urinary Tract: No adrenal nodule or mass.  Precontrast imaging shows no stones in either kidney or ureter although distal ureters are obscured by streak artifact from bilateral hip replacement. Posterior bladder also obscured.  Imaging after IV contrast administration shows no suspicious enhancing lesion in either kidney. 6.1 x 6.6 cm simple cyst identified interpolar right kidney. Exophytic 6.2 x 6.4 cm cyst identified in the lower pole the left kidney. 15 mm lower pole left renal cyst noted posteriorly. Other scattered tiny hypoattenuating renal lesions are too small to  characterize.  Delayed imaging shows mass-effect on the right intrarenal collecting system and renal pelvis due to the cyst. No evidence for soft tissue wall thickening or intraluminal soft tissue filling defect in either intrarenal collecting system or renal pelvis. Distal ureters have not been included on the delayed imaging.  Stomach/Bowel: Stomach is nondistended. No gastric wall thickening. No evidence of outlet obstruction. Duodenum is normally positioned as is the ligament of Treitz. No small bowel wall thickening. No small bowel dilatation. The terminal ileum is normal. The appendix is normal. Diverticuli are seen scattered along the entire length of the colon without CT findings of diverticulitis.  Vascular/Lymphatic: There is abdominal aortic atherosclerosis without aneurysm. There is no gastrohepatic or hepatoduodenal ligament lymphadenopathy. No intraperitoneal or retroperitoneal lymphadenopathy. No pelvic sidewall lymphadenopathy. Although portions of each pelvic sidewall are obscured by streak artifact.  Reproductive: Prostate gland obscured by streak artifact.  Other: No intraperitoneal free fluid.  Musculoskeletal: Small left groin hernia contains only fat. Patient is status post bilateral hip replacement. Bilateral pars interarticularis defects are noted at L5. No worrisome lytic or sclerotic osseous abnormality.  IMPRESSION: 1. No evidence for metastatic disease in the abdomen or pelvis in this patient with reported history of prostate cancer. Of note, the inferior aspect of both pelvic sidewalls, the prostate gland, and the posterior bladder are obscured by streak artifact from bilateral hip replacement. 2. 17 mm ground-glass nodule posterior right lower lobe with associated tiny 4 mm nodule in the posterior right costophrenic sulcus. Initial follow-up with CT at 6-12 months is recommended to confirm persistence. If persistent, repeat CT is recommended every  2 years until 5 years of stability has been established. This recommendation follows the consensus statement: Guidelines for Management of Incidental Pulmonary Nodules Detected on CT Images: From the Fleischner Society 2017; Radiology 2017; 284:228-243. 3. Bilateral renal cysts with other renal lesions too small to characterize. 4. Small left groin hernia contains only fat. 5. Aortic Atherosclerois (ICD10-170.0)   Electronically Signed By: Misty Stanley M.D.     I have independently reviewed the above radiology studies  and reviewed the findings with the patient.   Recent Lab Findings: Lab Results  Component Value Date   WBC 4.7 04/14/2018   HGB 13.4 04/14/2018   HCT 40.4 04/14/2018   PLT 166 04/14/2018   GLUCOSE 104 (H) 09/22/2017   ALT 30 09/22/2017   AST 28 09/22/2017   NA 141 09/22/2017   K 4.3 09/22/2017   CL 104 09/22/2017   CREATININE 0.78 09/22/2017   BUN 17 09/22/2017   CO2 28 09/22/2017   INR 1.0 04/14/2018      Assessment / Plan:   #1 solid and sub solid ground-glass nodule in the RIGHT lower lobe now confirmed as adenocarcinoma.  Most likely clinical stage I non-small cell lung cancer.  I discussed with the  Patient treatment options including surgical resection versus stereotactic radiotherapy.  At this point either would seem to be appropriate therapy, it is likely with the current trophic epidemic radiotherapy treatment could be undertaken sooner than surgical therapy.  I explained to the patient that we would present and discuss his case at the multidisciplinary thoracic oncology conference tomorrow morning and then I would get back with him with further recommendations.  His questions were answered.   The mass has not changed in size from comparison exam however the superior nodular component appears more prominent. -Although the groundglass opacity is not changed significantly in size does have a more nodular component to it on follow-up scan.   We have  not done a PET scan as it is unlikely that that and will help in evaluation of the groundglass opacity.  I  spent 25 minutes on the phone with the patient explaining the diagnosis treatment options and answering his questions    [name] MD      [address] [place] [address] Office [phone]   Beeper [phone]  04/19/2018 11:40 AM

## 2018-04-20 ENCOUNTER — Other Ambulatory Visit: Payer: Self-pay | Admitting: *Deleted

## 2018-04-20 ENCOUNTER — Other Ambulatory Visit: Payer: Self-pay

## 2018-04-20 ENCOUNTER — Ambulatory Visit (INDEPENDENT_AMBULATORY_CARE_PROVIDER_SITE_OTHER): Payer: Medicare Other | Admitting: Cardiothoracic Surgery

## 2018-04-20 ENCOUNTER — Telehealth: Payer: Self-pay | Admitting: Cardiothoracic Surgery

## 2018-04-20 DIAGNOSIS — R911 Solitary pulmonary nodule: Secondary | ICD-10-CM

## 2018-04-20 NOTE — Progress Notes (Signed)
The proposed treatment discussed in cancer conference 04/20/2018 is for discussion purpose only and is not a binding recommendation.  The patient was not physically examined nor present for their treatment options.  Therefore, final treatment plans cannot be decided.

## 2018-04-20 NOTE — Progress Notes (Signed)
[address]       [place] [address]             [phone]                    Frederick Hall [place] Medical Record #[id number] Date of Birth: [birth year]  Referring: Frederick Gallo, MD Primary Care: [name], MD Primary Cardiologist: No primary care provider on file.   Chief Complaint  Patient presents with  . Lung Lesion         I contacted Frederick Hall remotely due to the limitations of the current COVID pandemic on 04/20/2018  at  9:37 AM  verifying that I was speaking to Frederick Hall whose birthday is 1940/01/24.   I discussed limitations of the evaluation and management  of patients remotely without  the benefit of physical exam.  The patient was agreeable with proceeding with a remote/ not face to face visit.      History of Present Illness:    Frederick Hall 78 y.o. male  Had been   for evaluation of groundglass opacity in the right lower lung in the past. After being seen in late February I recommended to him that we proceed with CT directed needle biopsy of the lesion he wanted to delay this for several weeks.  A CT directed needle biopsy biopsy was done earlier this week , the final report the patient confirmed   Diagnosis Lung, needle/core biopsy(ies), Right lower lobe - ADENOCARCINOMA WITH A LEPIDIC PATTERN. SEE NOTE Diagnosis Note The lesion shows a predominantly lepidic (in situ) pattern. Definite evidence of invasion is not seen in the submitted biopsies but cannot be ruled out. Dr. Melina Copa has reviewed this case and concurs with the above diagnosis.  Dr. Servando Snare was paged on 04/18/2018 Jaquita Folds MD   The patient is a distant smoker having smoked for 10 to 12 years but he stopped smoking in 1976.  The patient was diagnosed with prostate cancer, June 2019 CT of the abdomen was performed indicating a groundglass opacity 1.4 to 1.5 cm in size in the right lower lobe, to follow-up on this a CT scan of the  chest was performed.  The patient was referred by Dr. Diona Fanti.  Patient denies any respiratory symptoms, denies hemoptysis.  He has undergone treatment for his prostate cancer with prostate seed plantation and also external beam radiation to the prostate.  He has had difficulty with frequent urination and is planning further urologic procedure in the near future.   He has no significant occupational respiratory exposures that he is aware of.    Current Activity/ Functional Status:  Patient is independent with mobility/ambulation, transfers, ADL's, IADL's.   Zubrod Score: At the time of surgery this patient's most appropriate activity status/level should be described as: [x]     0    Normal activity, no symptoms []     1    Restricted in physical strenuous activity but ambulatory, able to do out light work []     2    Ambulatory and capable of self care, unable to do work activities, up and about               >50 % of waking hours                              []     3  Only limited self care, in bed greater than 50% of waking hours []     4    Completely disabled, no self care, confined to bed or chair []     5    Moribund   Past Medical History:  Diagnosis Date  . Arthritis   . Asthma 2005   "attack x 1"  . BPH (benign prostatic hyperplasia)   . Erectile dysfunction   . GERD (gastroesophageal reflux disease)   . Headache    "Stabbing"  . Hematuria 12/2016  . History of acute prostatitis 12/2016  . Hypertension   . Lipoma 02/08/2014   Small subcutaneous mass right temporal region, noted on MRI Brain, pt unaware  . Nodule of right lung 08/26/2017   71mm ground glass posterior nodule, noted on CT chest  . Osteoarthritis of left hip 08/07/2012  . Osteoarthritis of right hip 05/23/2012  . Prostate cancer ([place])   . Renal cyst, right 06/27/2017   Large peripelvic, noted on NM Bone scan  . Seasonal allergies   . Skin cancer   . Wears hearing aid     Past Surgical History:   Procedure Laterality Date  . COLONOSCOPY  09/2017  . CYSTOSCOPY N/A 09/29/2017   Procedure: CYSTOSCOPY;  Surgeon: Frederick Gallo, MD;  Location: [facility];  Service: Urology;  Laterality: N/A;  no seeds in bladder per Dr Diona Fanti  . KNEE ARTHROSCOPY Left   . LIPOMA EXCISION     multiple  . RADIOACTIVE SEED IMPLANT N/A 09/29/2017   Procedure: RADIOACTIVE SEED IMPLANT/BRACHYTHERAPY IMPLANT;  Surgeon: Frederick Gallo, MD;  Location: [facility];  Service: Urology;  Laterality: N/A;  63 seeds  . SHOULDER ARTHROSCOPY Right   . SPACE OAR INSTILLATION N/A 09/29/2017   Procedure: SPACE OAR INSTILLATION;  Surgeon: Frederick Gallo, MD;  Location: [facility];  Service: Urology;  Laterality: N/A;  . TONSILLECTOMY    . TOTAL HIP ARTHROPLASTY Right 05/23/2012   Procedure: TOTAL HIP ARTHROPLASTY;  Surgeon: Johnny Bridge, MD;  Location: [facility];  Service: Orthopedics;  Laterality: Right;  . TOTAL HIP ARTHROPLASTY Left 08/07/2012   Procedure: TOTAL HIP ARTHROPLASTY- left ;  Surgeon: Johnny Bridge, MD;  Location: [place];  Service: Orthopedics;  Laterality: Left;    Family History  Problem Relation Age of Onset  . Cancer Mother   . Cancer Father      Social History   Tobacco Use  Smoking Status Former Smoker  . Packs/day: 1.00  . Years: 15.00  . Pack years: 15.00  . Types: Cigarettes  . Last attempt to quit: 05/18/1972  . Years since quitting: 45.9  Smokeless Tobacco Never Used  Tobacco Comment   social alcohol    Social History   Substance and Sexual Activity  Alcohol Use Yes  . Alcohol/week: 7.0 standard drinks  . Types: 7 Standard drinks or equivalent per week   Comment: 3  wine or beer     Allergies  Allergen Reactions  . Penicillins Anaphylaxis, Itching and Rash    Childhood allergy. Tolerated cefazolin 05/23/12. Did it involve swelling of the face/tongue/throat, SOB, or low BP? Yes Did it involve sudden or severe  rash/hives, skin peeling, or any reaction on the inside of your mouth or nose? Yes Did you need to seek medical attention at a hospital or doctor's office? Yes When did it last happen?Childhood reaction type  If all above answers are "NO", may proceed with cephalosporin use.   . Cefuroxime Axetil  Diarrhea  . Pollen Extract Itching    Seasonal allergies.    Current Outpatient Medications  Medication Sig Dispense Refill  . albuterol (PROVENTIL HFA;VENTOLIN HFA) 108 (90 Base) MCG/ACT inhaler Inhale into the lungs every 6 (six) hours as needed for wheezing or shortness of breath.    Marland Kitchen CALCIUM PO Take 1 tablet by mouth daily.    . famotidine (PEPCID) 20 MG tablet Take 20 mg by mouth daily before breakfast.     . hydrochlorothiazide (MICROZIDE) 12.5 MG capsule Take 12.5 mg by mouth daily.    Marland Kitchen leuprolide (LUPRON) 7.5 MG injection Inject 7.5 mg into the muscle every 4 (four) months.     Marland Kitchen lisinopril (PRINIVIL,ZESTRIL) 20 MG tablet Take 20 mg by mouth every morning.    . Multiple Vitamin (MULTIVITAMIN WITH MINERALS) TABS tablet Take 1 tablet by mouth daily.    . naproxen sodium (ALEVE) 220 MG tablet Take 220 mg by mouth 2 (two) times daily as needed (heavy activity (golf, exercise)).     Marland Kitchen tadalafil (ADCIRCA/CIALIS) 20 MG tablet Take 20 mg by mouth daily as needed for erectile dysfunction.     . tamsulosin (FLOMAX) 0.4 MG CAPS capsule Take 0.4 mg by mouth every evening.   11   No current facility-administered medications for this visit.     Pertinent items are noted in HPI.   Review of Systems:     Cardiac Review of Systems: [Y] = yes  or   [ N ] = no   Chest Pain [ n]  Resting SOB [ n ] Exertional SOB  [  n]  Orthopnea [n   Pedal Edema [ n]    Palpitations n ] Syncope  [ n   Presyncope [n]   General Review of Systems: [Y] = yes [  ]=no Constitional: recent weight change [  ];  Wt loss over the last 3 months [   ] anorexia [  ]; fatigue [  ]; nausea [  ]; night sweats [  ]; fever [  ];  or chills [  ];           Eye : blurred vision [  ]; diplopia [   ]; vision changes [  ];  Amaurosis fugax[  ]; Resp: cough [  ];  wheezing[  ];  hemoptysis[  ]; shortness of breath[  ]; paroxysmal nocturnal dyspnea[  ]; dyspnea on exertion[  ]; or orthopnea[  ];  GI:  gallstones[  ], vomiting[  ];  dysphagia[  ]; melena[  ];  hematochezia [  ]; heartburn[  ];   Hx of  Colonoscopy[  ]; GU: kidney stones [  ]; hematuria[  ];   dysuria [  ];  nocturia[  ];  history of     obstruction [  ]; urinary frequency [  ]             Skin: rash, swelling[  ];, hair loss[  ];  peripheral edema[  ];  or itching[  ]; Musculosketetal: myalgias[  ];  joint swelling[  ];  joint erythema[  ];  joint pain[  ];  back pain[  ];  Heme/Lymph: bruising[  ];  bleeding[  ];  anemia[  ];  Neuro: TIA[  ];  headaches[  ];  stroke[  ];  vertigo[  ];  seizures[  ];   paresthesias[  ];  difficulty walking[  ];  Psych:depression[  ]; anxiety[  ];  Endocrine: diabetes[  ];  thyroid dysfunction[  ];  Immunizations: Flu up to date [y]; Pneumococcal up to date [ y ];  Other:     PHYSICAL EXAMINATION:  Diagnostic Studies & Laboratory data:     Recent Radiology Findings:   Ct Super D Chest Wo Contrast  Result Date: 03/09/2018 CLINICAL DATA:  Indeterminate RIGHT lower lobe ground-glass nodule. EXAM: CT CHEST WITHOUT CONTRAST TECHNIQUE: Multidetector CT imaging of the chest was performed using thin slice collimation for electromagnetic bronchoscopy planning purposes, without intravenous contrast. COMPARISON:  CT 08/04/2017 FINDINGS: Cardiovascular: No significant vascular findings. Normal heart size. No pericardial effusion. Mediastinum/Nodes: No axillary supraclavicular adenopathy. No mediastinal adenopathy. Pericardial effusion. Esophagus. Lungs/Pleura: In the RIGHT lower lobe, peripheral ground-glass nodule measures 21 x 14 mm (image 79/8) compared to 22 by 15 mm on prior remeasured for no change. There is a small solid nodular  component more superiorly along the pleural surface (image 77/8). This superior nodular component appears more prominent on the coronal imaging (image 153/4). No additional suspicious pulmonary nodules. Upper Abdomen: Limited view of the liver, kidneys, pancreas are unremarkable. Normal adrenal glands. Musculoskeletal: No aggressive osseous lesion IMPRESSION: 1. Solid and sub solid ground-glass nodule in the RIGHT lower lobe is not changed in size from comparison exam however the superior nodular component appears more prominent. Consider tissue sampling of this suspicious morphology versus continued CT surveillance. 2. No lymphadenopathy. Electronically Signed   By: Suzy Bouchard M.D.   On: 03/09/2018 14:38      Final Report  CLINICAL DATA: Lung nodules on previous abdomen CT.  EXAM: CT CHEST WITHOUT CONTRAST  TECHNIQUE: Multidetector CT imaging of the chest was performed following the standard protocol without IV contrast.  COMPARISON: Abdomen and pelvis CT 06/27/2017  FINDINGS: Cardiovascular: The heart size is normal. No substantial pericardial effusion. Atherosclerotic calcification is noted in the wall of the thoracic aorta.  Mediastinum/Nodes: No mediastinal lymphadenopathy. There is no hilar lymphadenopathy. The esophagus has normal imaging features. There is no axillary lymphadenopathy.  Lungs/Pleura: The central tracheobronchial airways are patent. 6 mm perifissural nodule right lung (3:65) likely subpleural lymph node. Similar appearance 18 mm ground-glass nodule posterior right lower lung (3:60). 4 mm nodule posterior right costophrenic sulcus is stable. There is atelectasis in the dependent lung bases bilaterally.  Upper Abdomen: Right renal cysts incompletely visualized.  Musculoskeletal: Unremarkable  IMPRESSION: 1. No change 18 mm ground-glass nodule posterior right lung. Adenocarcinoma cannot be excluded. Follow up by CT is recommended in 12 months, with  continued annual surveillance for a minimum of 3 years. These recommendations are taken from:Recommendations for the Management of Subsolid Pulmonary Nodules Detected at CT: A Statement from the [facility] 2013; 266:1, 304-317. 2. Stable 4 mm posterior right lower lobe pulmonary nodule. Attention on follow-up.   Electronically Signed By: Misty Stanley M.D. On: 08/05/2017 10:50  Final Report  CLINICAL DATA: Prostate cancer.  EXAM: CT ABDOMEN AND PELVIS WITHOUT AND WITH CONTRAST  TECHNIQUE: Multidetector CT imaging of the abdomen and pelvis was performed following the standard protocol before and following the bolus administration of intravenous contrast.  CONTRAST: 100 cc Isovue 300.  COMPARISON: No comparison studies available.  FINDINGS: Lower chest: 4 mm nodule identified posterior right costophrenic sulcus (image 22/series 9). 17 mm ground-glass nodule noted right lower lobe (image 3/series 9).  Hepatobiliary: No focal abnormality within the liver parenchyma. There is no evidence for gallstones, gallbladder wall thickening, or pericholecystic fluid. No intrahepatic or extrahepatic biliary dilation.  Pancreas: No focal mass lesion. No  dilatation of the main duct. No intraparenchymal cyst. No peripancreatic edema.  Spleen: No splenomegaly. No focal mass lesion.  Adrenals/Urinary Tract: No adrenal nodule or mass.  Precontrast imaging shows no stones in either kidney or ureter although distal ureters are obscured by streak artifact from bilateral hip replacement. Posterior bladder also obscured.  Imaging after IV contrast administration shows no suspicious enhancing lesion in either kidney. 6.1 x 6.6 cm simple cyst identified interpolar right kidney. Exophytic 6.2 x 6.4 cm cyst identified in the lower pole the left kidney. 15 mm lower pole left renal cyst noted posteriorly. Other scattered tiny hypoattenuating renal lesions are too small to  characterize.  Delayed imaging shows mass-effect on the right intrarenal collecting system and renal pelvis due to the cyst. No evidence for soft tissue wall thickening or intraluminal soft tissue filling defect in either intrarenal collecting system or renal pelvis. Distal ureters have not been included on the delayed imaging.  Stomach/Bowel: Stomach is nondistended. No gastric wall thickening. No evidence of outlet obstruction. Duodenum is normally positioned as is the ligament of Treitz. No small bowel wall thickening. No small bowel dilatation. The terminal ileum is normal. The appendix is normal. Diverticuli are seen scattered along the entire length of the colon without CT findings of diverticulitis.  Vascular/Lymphatic: There is abdominal aortic atherosclerosis without aneurysm. There is no gastrohepatic or hepatoduodenal ligament lymphadenopathy. No intraperitoneal or retroperitoneal lymphadenopathy. No pelvic sidewall lymphadenopathy. Although portions of each pelvic sidewall are obscured by streak artifact.  Reproductive: Prostate gland obscured by streak artifact.  Other: No intraperitoneal free fluid.  Musculoskeletal: Small left groin hernia contains only fat. Patient is status post bilateral hip replacement. Bilateral pars interarticularis defects are noted at L5. No worrisome lytic or sclerotic osseous abnormality.  IMPRESSION: 1. No evidence for metastatic disease in the abdomen or pelvis in this patient with reported history of prostate cancer. Of note, the inferior aspect of both pelvic sidewalls, the prostate gland, and the posterior bladder are obscured by streak artifact from bilateral hip replacement. 2. 17 mm ground-glass nodule posterior right lower lobe with associated tiny 4 mm nodule in the posterior right costophrenic sulcus. Initial follow-up with CT at 6-12 months is recommended to confirm persistence. If persistent, repeat CT is recommended every  2 years until 5 years of stability has been established. This recommendation follows the consensus statement: Guidelines for Management of Incidental Pulmonary Nodules Detected on CT Images: From the [employer]ischner Society 2017; Radiology 2017; 284:228-243. 3. Bilateral renal cysts with other renal lesions too small to characterize. 4. Small left groin hernia contains only fat. 5. Aortic Atherosclerois (ICD10-170.0)   Electronically Signed By: Misty Stanley M.D.     I have independently reviewed the above radiology studies  and reviewed the findings with the patient.   Recent Lab Findings: Lab Results  Component Value Date   WBC 4.7 04/14/2018   HGB 13.4 04/14/2018   HCT 40.4 04/14/2018   PLT 166 04/14/2018   GLUCOSE 104 (H) 09/22/2017   ALT 30 09/22/2017   AST 28 09/22/2017   NA 141 09/22/2017   K 4.3 09/22/2017   CL 104 09/22/2017   CREATININE 0.78 09/22/2017   BUN 17 09/22/2017   CO2 28 09/22/2017   INR 1.0 04/14/2018      Assessment / Plan:   #1 solid and sub solid ground-glass nodule in the RIGHT lower lobe now confirmed as adenocarcinoma.  Most likely clinical stage I non-small cell lung cancer.  I discussed with the  Patient treatment options including surgical resection versus stereotactic radiotherapy.    The patient's case was presented at multidisciplinary thoracic oncology conference this morning, I recommended to him that first treatment option would be surgical resection.  When we are able to obtain pulmonary function studies and schedule surgery likely in June we will consider surgical resection at that time, patient is agreeable with this.  We will have him return to the office prior to surgical intervention with pulmonary function studies.  Plan on having him return to the office in mid to late May with potential surgery in early June.  We have not done a PET scan as it is unlikely that that and will help in evaluation of the groundglass opacity.   [name] MD      [address] [place] [address] Office [phone]   Beeper [phone]  04/20/2018 9:37 AM

## 2018-05-26 ENCOUNTER — Other Ambulatory Visit: Payer: Self-pay | Admitting: *Deleted

## 2018-05-26 ENCOUNTER — Other Ambulatory Visit: Payer: Self-pay

## 2018-05-26 ENCOUNTER — Encounter: Payer: Self-pay | Admitting: *Deleted

## 2018-05-26 ENCOUNTER — Other Ambulatory Visit (HOSPITAL_COMMUNITY)
Admission: RE | Admit: 2018-05-26 | Discharge: 2018-05-26 | Disposition: A | Discharge status: Home / Self Care | Payer: Medicare Other | Source: Ambulatory Visit | Attending: Cardiothoracic Surgery | Admitting: Cardiothoracic Surgery

## 2018-05-26 DIAGNOSIS — R911 Solitary pulmonary nodule: Secondary | ICD-10-CM

## 2018-05-26 DIAGNOSIS — Z1159 Encounter for screening for other viral diseases: Secondary | ICD-10-CM | POA: Insufficient documentation

## 2018-05-27 LAB — NOVEL CORONAVIRUS, NAA (HOSP ORDER, SEND-OUT TO REF LAB; TAT 18-24 HRS): SARS-CoV-2, NAA: NOT DETECTED

## 2018-05-30 ENCOUNTER — Other Ambulatory Visit: Payer: Self-pay

## 2018-05-30 ENCOUNTER — Ambulatory Visit (HOSPITAL_COMMUNITY)
Admission: RE | Admit: 2018-05-30 | Discharge: 2018-05-30 | Disposition: A | Discharge status: Home / Self Care | Payer: Medicare Other | Source: Ambulatory Visit | Attending: Cardiothoracic Surgery | Admitting: Cardiothoracic Surgery

## 2018-05-30 ENCOUNTER — Ambulatory Visit: Payer: Medicare Other | Admitting: Cardiothoracic Surgery

## 2018-05-30 DIAGNOSIS — R911 Solitary pulmonary nodule: Secondary | ICD-10-CM | POA: Diagnosis not present

## 2018-05-30 LAB — PULMONARY FUNCTION TEST
DL/VA % pred: 98 %
DL/VA: 3.9 ml/min/mmHg/L
DLCO unc % pred: 86 %
DLCO unc: 20.89 ml/min/mmHg
FEF 25-75 Post: 1.73 L/sec
FEF 25-75 Pre: 1.63 L/sec
FEF2575-%Change-Post: 5 %
FEF2575-%Pred-Post: 83 %
FEF2575-%Pred-Pre: 78 %
FEV1-%Change-Post: 1 %
FEV1-%Pred-Post: 92 %
FEV1-%Pred-Pre: 90 %
FEV1-Post: 2.68 L
FEV1-Pre: 2.64 L
FEV1FVC-%Change-Post: 5 %
FEV1FVC-%Pred-Pre: 98 %
FEV6-%Change-Post: -4 %
FEV6-%Pred-Post: 91 %
FEV6-%Pred-Pre: 95 %
FEV6-Post: 3.44 L
FEV6-Pre: 3.62 L
FEV6FVC-%Change-Post: -1 %
FEV6FVC-%Pred-Post: 103 %
FEV6FVC-%Pred-Pre: 104 %
FVC-%Change-Post: -3 %
FVC-%Pred-Post: 88 %
FVC-%Pred-Pre: 91 %
FVC-Post: 3.57 L
FVC-Pre: 3.7 L
Post FEV1/FVC ratio: 75 %
Post FEV6/FVC ratio: 97 %
Pre FEV1/FVC ratio: 71 %
Pre FEV6/FVC Ratio: 98 %
RV % pred: 97 %
RV: 2.47 L
TLC % pred: 91 %
TLC: 6.29 L

## 2018-05-30 MED ORDER — ALBUTEROL SULFATE (2.5 MG/3ML) 0.083% IN NEBU
2.5000 mg | INHALATION_SOLUTION | Freq: Once | RESPIRATORY_TRACT | Status: AC
  Administered 2018-05-30: 2.5 mg via RESPIRATORY_TRACT

## 2018-05-31 ENCOUNTER — Ambulatory Visit: Payer: Medicare Other | Admitting: Cardiothoracic Surgery

## 2018-05-31 ENCOUNTER — Encounter: Payer: Self-pay | Admitting: Cardiothoracic Surgery

## 2018-05-31 ENCOUNTER — Ambulatory Visit (INDEPENDENT_AMBULATORY_CARE_PROVIDER_SITE_OTHER): Payer: Medicare Other | Admitting: Cardiothoracic Surgery

## 2018-05-31 VITALS — BP 136/88 | HR 62 | Temp 97.6°F | Resp 16 | Ht 69.0 in | Wt 190.0 lb

## 2018-05-31 DIAGNOSIS — R911 Solitary pulmonary nodule: Secondary | ICD-10-CM

## 2018-05-31 NOTE — Progress Notes (Signed)
[address]       [place] [address]             [phone]                    Petro S Galloway [place] Medical Record #[id number] Date of Birth: Mar 19, 1940  Referring: Franchot Gallo, MD Primary Care: [name], MD Primary Cardiologist: No primary care provider on file.   Chief Complaint  Patient presents with  . Lung Lesion         History of Present Illness:    Frederick Hall 78 y.o. male seen in the office today.  He has been evaluated for  groundglass opacity in the right lower lung in the past. The patient is a distant smoker having smoked for 10 to 12 years but he stopped smoking in 1976. The patient was recently diagnosed with prostate cancer in late summer 2019 by Dr. Diona Fanti, CT of the abdomen was performed indicating a groundglass opacity 1.4 to 1.5 cm in size in the right lower lobe, to follow-up on this a CT scan of the chest was performed.  The patient was referred by Dr. Diona Fanti.  Patient denies any respiratory symptoms, denies hemoptysis.  He has no significant occupational respiratory exposures that he is aware of.  in late February 2020 I recommended to him that we proceed with CT directed needle biopsy of the lesion he wanted to delay this for several weeks.  A CT directed needle biopsy biopsy was done , the final report the patient confirmed   Diagnosis Lung, needle/core biopsy(ies), Right lower lobe - ADENOCARCINOMA WITH A LEPIDIC PATTERN. SEE NOTE Diagnosis Note The lesion shows a predominantly lepidic (in situ) pattern. Definite evidence of invasion is not seen in the submitted biopsies but cannot be ruled out. Dr. Melina Copa has reviewed this case and concurs with the above diagnosis.  Dr. Servando Snare was paged on 04/18/2018 Jaquita Folds MD   The patient is a distant smoker having smoked for 10 to 12 years but he stopped smoking in 1976.  He has no significant occupational respiratory exposures that he is aware of.     Current Activity/ Functional Status:  Patient is independent with mobility/ambulation, transfers, ADL's, IADL's.   Zubrod Score: At the time of surgery this patient's most appropriate activity status/level should be described as: [x]     0    Normal activity, no symptoms []     1    Restricted in physical strenuous activity but ambulatory, able to do out light work []     2    Ambulatory and capable of self care, unable to do work activities, up and about               >50 % of waking hours                              []     3    Only limited self care, in bed greater than 50% of waking hours []     4    Completely disabled, no self care, confined to bed or chair []     5    Moribund   Past Medical History:  Diagnosis Date  . Arthritis   . Asthma 2005   "attack x 1"  . BPH (benign prostatic hyperplasia)   . Erectile dysfunction   . GERD (gastroesophageal reflux disease)   .  Headache    "Stabbing"  . Hematuria 12/2016  . History of acute prostatitis 12/2016  . Hypertension   . Lipoma 02/08/2014   Small subcutaneous mass right temporal region, noted on MRI Brain, pt unaware  . Nodule of right lung 08/26/2017   76mm ground glass posterior nodule, noted on CT chest  . Osteoarthritis of left hip 08/07/2012  . Osteoarthritis of right hip 05/23/2012  . Prostate cancer ([facility])   . Renal cyst, right 06/27/2017   Large peripelvic, noted on [facility] Bone scan  . Seasonal allergies   . Skin cancer   . Wears hearing aid     Past Surgical History:  Procedure Laterality Date  . COLONOSCOPY  09/2017  . CYSTOSCOPY N/A 09/29/2017   Procedure: CYSTOSCOPY;  Surgeon: Franchot Gallo, MD;  Location: [facility];  Service: Urology;  Laterality: N/A;  no seeds in bladder per Dr Diona Fanti  . KNEE ARTHROSCOPY Left   . LIPOMA EXCISION     multiple  . RADIOACTIVE SEED IMPLANT N/A 09/29/2017   Procedure: RADIOACTIVE SEED IMPLANT/BRACHYTHERAPY IMPLANT;  Surgeon: Franchot Gallo, MD;   Location: [facility];  Service: Urology;  Laterality: N/A;  63 seeds  . SHOULDER ARTHROSCOPY Right   . SPACE OAR INSTILLATION N/A 09/29/2017   Procedure: SPACE OAR INSTILLATION;  Surgeon: Franchot Gallo, MD;  Location: [facility];  Service: Urology;  Laterality: N/A;  . TONSILLECTOMY    . TOTAL HIP ARTHROPLASTY Right 05/23/2012   Procedure: TOTAL HIP ARTHROPLASTY;  Surgeon: Johnny Bridge, MD;  Location: [facility];  Service: Orthopedics;  Laterality: Right;  . TOTAL HIP ARTHROPLASTY Left 08/07/2012   Procedure: TOTAL HIP ARTHROPLASTY- left ;  Surgeon: Johnny Bridge, MD;  Location: [facility];  Service: Orthopedics;  Laterality: Left;    Family History  Problem Relation Age of Onset  . Cancer Mother   . Cancer Father      Social History   Tobacco Use  Smoking Status Former Smoker  . Packs/day: 1.00  . Years: 15.00  . Pack years: 15.00  . Types: Cigarettes  . Last attempt to quit: 05/18/1972  . Years since quitting: 46.0  Smokeless Tobacco Never Used  Tobacco Comment   social alcohol    Social History   Substance and Sexual Activity  Alcohol Use Yes  . Alcohol/week: 7.0 standard drinks  . Types: 7 Standard drinks or equivalent per week   Comment: 3  wine or beer     Allergies  Allergen Reactions  . Penicillins Anaphylaxis, Itching and Rash    Childhood allergy. Tolerated cefazolin 05/23/12. Did it involve swelling of the face/tongue/throat, SOB, or low BP? Yes Did it involve sudden or severe rash/hives, skin peeling, or any reaction on the inside of your mouth or nose? Yes Did you need to seek medical attention at a hospital or doctor's office? Yes When did it last happen?Childhood reaction type  If all above answers are "NO", may proceed with cephalosporin use.   . Cefuroxime Axetil Diarrhea  . Pollen Extract Itching    Seasonal allergies.    Current Outpatient Medications  Medication Sig Dispense Refill  . Ascorbic Acid (VITAMIN C  PO) Take by mouth daily.    Marland Kitchen CALCIUM PO Take 1 tablet by mouth daily.    . famotidine (PEPCID) 20 MG tablet Take 20 mg by mouth daily before breakfast.     . hydrochlorothiazide (MICROZIDE) 12.5 MG capsule Take 12.5 mg by mouth daily.    Marland Kitchen  leuprolide (LUPRON) 7.5 MG injection Inject 7.5 mg into the muscle every 4 (four) months. LAST DOSE 05/26/18    . lisinopril (PRINIVIL,ZESTRIL) 20 MG tablet Take 20 mg by mouth every morning.    . Meth-Hyo-M Bl-Na Phos-Ph Sal (URO-MP) 118 MG CAPS Take 1 capsule by mouth 2 (two) times a day.    . Multiple Vitamin (MULTIVITAMIN WITH MINERALS) TABS tablet Take 1 tablet by mouth daily.    . Multiple Vitamins-Minerals (ZINC PO) Take by mouth daily.    . naproxen sodium (ALEVE) 220 MG tablet Take 220 mg by mouth 2 (two) times daily as needed (heavy activity (golf, exercise)).     Marland Kitchen tadalafil (ADCIRCA/CIALIS) 20 MG tablet Take 20 mg by mouth daily as needed for erectile dysfunction.     . tamsulosin (FLOMAX) 0.4 MG CAPS capsule Take 0.4 mg by mouth every evening.   11   No current facility-administered medications for this visit.     Pertinent items are noted in HPI.   Review of Systems:     Cardiac Review of Systems: [Y] = yes  or   [ N ] = no   Chest Pain [ n]  Resting SOB [ n ] Exertional SOB  [  n]  Orthopnea [n   Pedal Edema [ n]    Palpitations n ] Syncope  [ n   Presyncope [n]   General Review of Systems: [Y] = yes [  ]=no Constitional: recent weight change [  ];  Wt loss over the last 3 months [   ] anorexia [  ]; fatigue [  ]; nausea [  ]; night sweats [  ]; fever [  ]; or chills [  ];           Eye : blurred vision [  ]; diplopia [   ]; vision changes [  ];  Amaurosis fugax[  ]; Resp: cough [  ];  wheezing[  ];  hemoptysis[  ]; shortness of breath[  ]; paroxysmal nocturnal dyspnea[  ]; dyspnea on exertion[  ]; or orthopnea[  ];  GI:  gallstones[  ], vomiting[  ];  dysphagia[  ]; melena[  ];  hematochezia [  ]; heartburn[  ];   Hx of  Colonoscopy[  ];  GU: kidney stones [  ]; hematuria[  ];   dysuria [  ];  nocturia[  ];  history of     obstruction [  ]; urinary frequency [  ]             Skin: rash, swelling[  ];, hair loss[  ];  peripheral edema[  ];  or itching[  ]; Musculosketetal: myalgias[  ];  joint swelling[  ];  joint erythema[  ];  joint pain[  ];  back pain[  ];  Heme/Lymph: bruising[  ];  bleeding[  ];  anemia[  ];  Neuro: TIA[  ];  headaches[  ];  stroke[  ];  vertigo[  ];  seizures[  ];   paresthesias[  ];  difficulty walking[  ];  Psych:depression[  ]; anxiety[  ];  Endocrine: diabetes[  ];  thyroid dysfunction[  ];  Immunizations: Flu up to date [y]; Pneumococcal up to date [ y ];  Other:     PHYSICAL EXAMINATION: General appearance: alert, cooperative and no distress Head: Normocephalic, without obvious abnormality, atraumatic Neck: no adenopathy, no carotid bruit, no JVD, supple, symmetrical, trachea midline and thyroid not enlarged, symmetric, no tenderness/mass/nodules Lymph nodes: Cervical, supraclavicular,  and axillary nodes normal. Resp: clear to auscultation bilaterally Back: symmetric, no curvature. ROM normal. No CVA tenderness. Cardio: regular rate and rhythm, S1, S2 normal, no murmur, click, rub or gallop GI: soft, non-tender; bowel sounds normal; no masses,  no organomegaly Extremities: extremities normal, atraumatic, no cyanosis or edema and Homans sign is negative, no sign of DVT Neurologic: Grossly normal  Diagnostic Studies & Laboratory data:     Recent Radiology Findings:   Ct Super D Chest Wo Contrast  Result Date: 03/09/2018 CLINICAL DATA:  Indeterminate RIGHT lower lobe ground-glass nodule. EXAM: CT CHEST WITHOUT CONTRAST TECHNIQUE: Multidetector CT imaging of the chest was performed using thin slice collimation for electromagnetic bronchoscopy planning purposes, without intravenous contrast. COMPARISON:  CT 08/04/2017 FINDINGS: Cardiovascular: No significant vascular findings. Normal heart size.  No pericardial effusion. Mediastinum/Nodes: No axillary supraclavicular adenopathy. No mediastinal adenopathy. Pericardial effusion. Esophagus. Lungs/Pleura: In the RIGHT lower lobe, peripheral ground-glass nodule measures 21 x 14 mm (image 79/8) compared to 22 by 15 mm on prior remeasured for no change. There is a small solid nodular component more superiorly along the pleural surface (image 77/8). This superior nodular component appears more prominent on the coronal imaging (image 153/4). No additional suspicious pulmonary nodules. Upper Abdomen: Limited view of the liver, kidneys, pancreas are unremarkable. Normal adrenal glands. Musculoskeletal: No aggressive osseous lesion IMPRESSION: 1. Solid and sub solid ground-glass nodule in the RIGHT lower lobe is not changed in size from comparison exam however the superior nodular component appears more prominent. Consider tissue sampling of this suspicious morphology versus continued CT surveillance. 2. No lymphadenopathy. Electronically Signed   By: Suzy Bouchard M.D.   On: 03/09/2018 14:38      Final Report  CLINICAL DATA: Lung nodules on previous abdomen CT.  EXAM: CT CHEST WITHOUT CONTRAST  TECHNIQUE: Multidetector CT imaging of the chest was performed following the standard protocol without IV contrast.  COMPARISON: Abdomen and pelvis CT 06/27/2017  FINDINGS: Cardiovascular: The heart size is normal. No substantial pericardial effusion. Atherosclerotic calcification is noted in the wall of the thoracic aorta.  Mediastinum/Nodes: No mediastinal lymphadenopathy. There is no hilar lymphadenopathy. The esophagus has normal imaging features. There is no axillary lymphadenopathy.  Lungs/Pleura: The central tracheobronchial airways are patent. 6 mm perifissural nodule right lung (3:65) likely subpleural lymph node. Similar appearance 18 mm ground-glass nodule posterior right lower lung (3:60). 4 mm nodule posterior right costophrenic sulcus  is stable. There is atelectasis in the dependent lung bases bilaterally.  Upper Abdomen: Right renal cysts incompletely visualized.  Musculoskeletal: Unremarkable  IMPRESSION: 1. No change 18 mm ground-glass nodule posterior right lung. Adenocarcinoma cannot be excluded. Follow up by CT is recommended in 12 months, with continued annual surveillance for a minimum of 3 years. These recommendations are taken from:Recommendations for the Management of Subsolid Pulmonary Nodules Detected at CT: A Statement from the [place] [facility] 2013; 266:1, 304-317. 2. Stable 4 mm posterior right lower lobe pulmonary nodule. Attention on follow-up.   Electronically Signed By: Misty Stanley M.D. On: 08/05/2017 10:50  Final Report  CLINICAL DATA: Prostate cancer.  EXAM: CT ABDOMEN AND PELVIS WITHOUT AND WITH CONTRAST  TECHNIQUE: Multidetector CT imaging of the abdomen and pelvis was performed following the standard protocol before and following the bolus administration of intravenous contrast.  CONTRAST: 100 cc Isovue 300.  COMPARISON: No comparison studies available.  FINDINGS: Lower chest: 4 mm nodule identified posterior right costophrenic sulcus (image 22/series 9). 17 mm ground-glass nodule noted right lower lobe (  image 3/series 9).  Hepatobiliary: No focal abnormality within the liver parenchyma. There is no evidence for gallstones, gallbladder wall thickening, or pericholecystic fluid. No intrahepatic or extrahepatic biliary dilation.  Pancreas: No focal mass lesion. No dilatation of the main duct. No intraparenchymal cyst. No peripancreatic edema.  Spleen: No splenomegaly. No focal mass lesion.  Adrenals/Urinary Tract: No adrenal nodule or mass.  Precontrast imaging shows no stones in either kidney or ureter although distal ureters are obscured by streak artifact from bilateral hip replacement. Posterior bladder also obscured.  Imaging after IV contrast  administration shows no suspicious enhancing lesion in either kidney. 6.1 x 6.6 cm simple cyst identified interpolar right kidney. Exophytic 6.2 x 6.4 cm cyst identified in the lower pole the left kidney. 15 mm lower pole left renal cyst noted posteriorly. Other scattered tiny hypoattenuating renal lesions are too small to characterize.  Delayed imaging shows mass-effect on the right intrarenal collecting system and renal pelvis due to the cyst. No evidence for soft tissue wall thickening or intraluminal soft tissue filling defect in either intrarenal collecting system or renal pelvis. Distal ureters have not been included on the delayed imaging.  Stomach/Bowel: Stomach is nondistended. No gastric wall thickening. No evidence of outlet obstruction. Duodenum is normally positioned as is the ligament of Treitz. No small bowel wall thickening. No small bowel dilatation. The terminal ileum is normal. The appendix is normal. Diverticuli are seen scattered along the entire length of the colon without CT findings of diverticulitis.  Vascular/Lymphatic: There is abdominal aortic atherosclerosis without aneurysm. There is no gastrohepatic or hepatoduodenal ligament lymphadenopathy. No intraperitoneal or retroperitoneal lymphadenopathy. No pelvic sidewall lymphadenopathy. Although portions of each pelvic sidewall are obscured by streak artifact.  Reproductive: Prostate gland obscured by streak artifact.  Other: No intraperitoneal free fluid.  Musculoskeletal: Small left groin hernia contains only fat. Patient is status post bilateral hip replacement. Bilateral pars interarticularis defects are noted at L5. No worrisome lytic or sclerotic osseous abnormality.  IMPRESSION: 1. No evidence for metastatic disease in the abdomen or pelvis in this patient with reported history of prostate cancer. Of note, the inferior aspect of both pelvic sidewalls, the prostate gland, and the posterior  bladder are obscured by streak artifact from bilateral hip replacement. 2. 17 mm ground-glass nodule posterior right lower lobe with associated tiny 4 mm nodule in the posterior right costophrenic sulcus. Initial follow-up with CT at 6-12 months is recommended to confirm persistence. If persistent, repeat CT is recommended every 2 years until 5 years of stability has been established. This recommendation follows the consensus statement: Guidelines for Management of Incidental Pulmonary Nodules Detected on CT Images: From the Fleischner Society 2017; Radiology 2017; 284:228-243. 3. Bilateral renal cysts with other renal lesions too small to characterize. 4. Small left groin hernia contains only fat. 5. Aortic Atherosclerois (ICD10-170.0)   Electronically Signed By: Misty Stanley M.D.     I have independently reviewed the above radiology studies  and reviewed the findings with the patient.   Recent Lab Findings: Lab Results  Component Value Date   WBC 4.7 04/14/2018   HGB 13.4 04/14/2018   HCT 40.4 04/14/2018   PLT 166 04/14/2018   GLUCOSE 104 (H) 09/22/2017   ALT 30 09/22/2017   AST 28 09/22/2017   NA 141 09/22/2017   K 4.3 09/22/2017   CL 104 09/22/2017   CREATININE 0.78 09/22/2017   BUN 17 09/22/2017   CO2 28 09/22/2017   INR 1.0 04/14/2018   PFT's FEV1  2.64 92% DLCO 20.89 86%  The FVC, FEV1, FEV1/FVC ratio and FEF25-75% are within normal limits, but there is curvature to the flow volume loop suggesting minimal small airway disease. Patient effort was erratic, but the normal FEV1 indicates no significant obstruction. The airway resistance is normal. Lung volumes are within normal limits. Following administration of bronchodilators, there is no significant response. The diffusing capacity is normal. However, the diffusing capacity was not corrected for the patient's hemoglobin. Conclusions: Minimal airway obstruction is present suggesting small airway disease.  Pulmonary Function Diagnosis: Minimal Obstructive Airways Disease   Assessment / Plan:   #1 solid and sub solid ground-glass nodule in the RIGHT lower lobe now confirmed as adenocarcinoma.  Most likely clinical stage I non-small cell lung cancer.  I discussed with the Patient treatment options including surgical resection versus stereotactic radiotherapy.  After previous discussion and follow-up discussion today the patient is agreeable to proceed with surgical resection, right VATS and possible segmentectomy with node dissection.  The risks of the procedure have been discussed with the patient detail and he is willing to proceed tentatively planned for June third.    [name] MD      [address] [place] [address] Office [phone]   Beeper [phone]  05/31/2018 1:38 PM

## 2018-06-01 ENCOUNTER — Ambulatory Visit: Payer: Medicare Other | Admitting: Cardiothoracic Surgery

## 2018-06-09 NOTE — Progress Notes (Signed)
[id number] - [place], [place] - [address] [place]  [address] Phone: [phone] Fax: [phone]      Your procedure is scheduled on June 3  Report to [facility] Main Entrance "A" at [place].M., and check in at the Admitting office.  Call this number if you have problems the morning of surgery:  [phone]  Call [phone] if you have any questions prior to your surgery date Monday-Friday 8am-4pm    Remember:  Do not eat or drink after midnight.    Take these medicines the morning of surgery with A SIP OF WATER  acetaminophen (TYLENOL)  famotidine (PEPCID)   7 days prior to surgery STOP taking any Aspirin (unless otherwise instructed by your surgeon), Aleve, Naproxen, Ibuprofen, Motrin, Advil, Goody's, BC's, all herbal medications, fish oil, and all vitamins.    The Morning of Surgery  Do not wear jewelry, make-up or nail polish.  Do not wear lotions, powders, or perfumes/colognes, or deodorant  Do not shave 48 hours prior to surgery.  Men may shave face and neck.  Do not bring valuables to the hospital.  [facility] is not responsible for any belongings or valuables.  If you are a smoker, DO NOT Smoke 24 hours prior to surgery IF you wear a CPAP at night please bring your mask, tubing, and machine the morning of surgery   Remember that you must have someone to transport you home after your surgery, and remain with you for 24 hours if you are discharged the same day.   Contacts, glasses, hearing aids, dentures or bridgework may not be worn into surgery.    Leave your suitcase in the car.  After surgery it may be brought to your room.  For patients admitted to the hospital, discharge time will be determined by your treatment team.  Patients discharged the day of surgery will not be allowed to drive home.    Special instructions:   [facility]- Preparing For Surgery  Before surgery, you can play an  important role. Because skin is not sterile, your skin needs to be as free of germs as possible. You can reduce the number of germs on your skin by washing with CHG (chlorahexidine gluconate) Soap before surgery.  CHG is an antiseptic cleaner which kills germs and bonds with the skin to continue killing germs even after washing.    Oral Hygiene is also important to reduce your risk of infection.  Remember - BRUSH YOUR TEETH THE MORNING OF SURGERY WITH YOUR REGULAR TOOTHPASTE  Please do not use if you have an allergy to CHG or antibacterial soaps. If your skin becomes reddened/irritated stop using the CHG.  Do not shave (including legs and underarms) for at least 48 hours prior to first CHG shower. It is OK to shave your face.  Please follow these instructions carefully.   1. Shower the NIGHT BEFORE SURGERY and the MORNING OF SURGERY with CHG Soap.   2. If you chose to wash your hair, wash your hair first as usual with your normal shampoo.  3. After you shampoo, rinse your hair and body thoroughly to remove the shampoo.  4. Use CHG as you would any other liquid soap. You can apply CHG directly to the skin and wash gently with a scrungie or a clean washcloth.   5. Apply the CHG Soap to your body ONLY FROM THE NECK DOWN.  Do not use on open wounds  or open sores. Avoid contact with your eyes, ears, mouth and genitals (private parts). Wash Face and genitals (private parts)  with your normal soap.   6. Wash thoroughly, paying special attention to the area where your surgery will be performed.  7. Thoroughly rinse your body with warm water from the neck down.  8. DO NOT shower/wash with your normal soap after using and rinsing off the CHG Soap.  9. Pat yourself dry with a CLEAN TOWEL.  10. Wear CLEAN PAJAMAS to bed the night before surgery, wear comfortable clothes the morning of surgery  11. Place CLEAN SHEETS on your bed the night of your first shower and DO NOT SLEEP WITH PETS.    Day of  Surgery:  Do not apply any deodorants/lotions.  Please wear clean clothes to the hospital/surgery center.   Remember to brush your teeth WITH YOUR REGULAR TOOTHPASTE.   Please read over the following fact sheets that you were given.

## 2018-06-12 ENCOUNTER — Ambulatory Visit (HOSPITAL_COMMUNITY)
Admission: RE | Admit: 2018-06-12 | Discharge: 2018-06-12 | Disposition: A | Discharge status: Home / Self Care | Payer: Medicare Other | Source: Ambulatory Visit | Attending: Cardiothoracic Surgery | Admitting: Cardiothoracic Surgery

## 2018-06-12 ENCOUNTER — Encounter (HOSPITAL_COMMUNITY)
Admission: RE | Admit: 2018-06-12 | Discharge: 2018-06-12 | Disposition: A | Discharge status: Home / Self Care | Payer: Medicare Other | Source: Ambulatory Visit | Attending: Cardiothoracic Surgery | Admitting: Cardiothoracic Surgery

## 2018-06-12 ENCOUNTER — Other Ambulatory Visit (HOSPITAL_COMMUNITY)
Admission: RE | Admit: 2018-06-12 | Discharge: 2018-06-12 | Disposition: A | Discharge status: Home / Self Care | Payer: Medicare Other | Source: Ambulatory Visit | Attending: Cardiothoracic Surgery | Admitting: Cardiothoracic Surgery

## 2018-06-12 ENCOUNTER — Encounter (HOSPITAL_COMMUNITY): Payer: Self-pay

## 2018-06-12 ENCOUNTER — Other Ambulatory Visit: Payer: Self-pay

## 2018-06-12 DIAGNOSIS — R911 Solitary pulmonary nodule: Secondary | ICD-10-CM

## 2018-06-12 DIAGNOSIS — Z1159 Encounter for screening for other viral diseases: Secondary | ICD-10-CM | POA: Insufficient documentation

## 2018-06-12 LAB — URINALYSIS, ROUTINE W REFLEX MICROSCOPIC
Bilirubin Urine: NEGATIVE
Glucose, UA: NEGATIVE mg/dL
Hgb urine dipstick: NEGATIVE
Ketones, ur: NEGATIVE mg/dL
Leukocytes,Ua: NEGATIVE
Nitrite: NEGATIVE
Protein, ur: NEGATIVE mg/dL
Specific Gravity, Urine: 1.008 (ref 1.005–1.030)
pH: 7 (ref 5.0–8.0)

## 2018-06-12 LAB — BLOOD GAS, ARTERIAL
Acid-base deficit: 0.4 mmol/L (ref 0.0–2.0)
Bicarbonate: 23.6 mmol/L (ref 20.0–28.0)
Drawn by: 421801
FIO2: 21
O2 Saturation: 98.9 %
Patient temperature: 98.6
pCO2 arterial: 37.5 mmHg (ref 32.0–48.0)
pH, Arterial: 7.415 (ref 7.350–7.450)
pO2, Arterial: 139 mmHg — ABNORMAL HIGH (ref 83.0–108.0)

## 2018-06-12 LAB — PROTIME-INR
INR: 1 (ref 0.8–1.2)
Prothrombin Time: 12.6 seconds (ref 11.4–15.2)

## 2018-06-12 LAB — COMPREHENSIVE METABOLIC PANEL
ALT: 21 U/L (ref 0–44)
AST: 20 U/L (ref 15–41)
Albumin: 3.2 g/dL — ABNORMAL LOW (ref 3.5–5.0)
Alkaline Phosphatase: 64 U/L (ref 38–126)
Anion gap: 10 (ref 5–15)
BUN: 14 mg/dL (ref 8–23)
CO2: 21 mmol/L — ABNORMAL LOW (ref 22–32)
Calcium: 9 mg/dL (ref 8.9–10.3)
Chloride: 104 mmol/L (ref 98–111)
Creatinine, Ser: 0.74 mg/dL (ref 0.61–1.24)
GFR calc Af Amer: >60 mL/min (ref >60)
GFR calc non Af Amer: >60 mL/min (ref >60)
Glucose, Bld: 107 mg/dL — ABNORMAL HIGH (ref 70–99)
Potassium: 3.7 mmol/L (ref 3.5–5.1)
Sodium: 135 mmol/L (ref 135–145)
Total Bilirubin: 0.7 mg/dL (ref 0.3–1.2)
Total Protein: 5.9 g/dL — ABNORMAL LOW (ref 6.5–8.1)

## 2018-06-12 LAB — SURGICAL PCR SCREEN
MRSA, PCR: NEGATIVE
Staphylococcus aureus: NEGATIVE

## 2018-06-12 LAB — CBC
HCT: 37.9 % — ABNORMAL LOW (ref 39.0–52.0)
Hemoglobin: 12.2 g/dL — ABNORMAL LOW (ref 13.0–17.0)
MCH: 31.3 pg (ref 26.0–34.0)
MCHC: 32.2 g/dL (ref 30.0–36.0)
MCV: 97.2 fL (ref 80.0–100.0)
Platelets: 153 10*3/uL (ref 150–400)
RBC: 3.9 MIL/uL — ABNORMAL LOW (ref 4.22–5.81)
RDW: 15.3 % (ref 11.5–15.5)
WBC: 4.9 10*3/uL (ref 4.0–10.5)
nRBC: 0 % (ref 0.0–0.2)

## 2018-06-12 LAB — SARS CORONAVIRUS 2 BY RT PCR (HOSPITAL ORDER, PERFORMED IN ~~LOC~~ HOSPITAL LAB): SARS Coronavirus 2: NEGATIVE

## 2018-06-12 LAB — TYPE AND SCREEN
ABO/RH(D): B POS
Antibody Screen: NEGATIVE

## 2018-06-12 LAB — APTT: aPTT: 31 seconds (ref 24–36)

## 2018-06-12 NOTE — Progress Notes (Addendum)
PCP - Kathryne Eriksson, MD Cardiologist - pt denies  Chest x-ray - 06/12/2018 at PAT appt EKG - 06/12/2018 at PAT appt PFT- 05/30/2018  Stress Test - pt denies ECHO - pt denies  Cardiac Cath - pt denies  Sleep Study - pt denies CPAP - n/a  Fasting Blood Sugar - n/a Checks Blood Sugar _____ times a day-n/a  Blood Thinner Instructions: n/a Aspirin Instructions: n/a  Anesthesia review: pending labs/  Patient denies shortness of breath, fever, cough and chest pain at PAT appointment  Patient verbalized understanding of instructions that were given to them at the PAT appointment. Patient was also instructed that they will need to review over the PAT instructions again at home before surgery.   Coronavirus Screening  Have you experienced the following symptoms:  Cough yes/no: No Fever (>100.65F)  yes/no: No Runny nose yes/no: No Sore throat yes/no: No Difficulty breathing/shortness of breath  yes/no: No  Have you or a family member traveled in the last 14 days and where? yes/no: No

## 2018-06-12 NOTE — Progress Notes (Signed)
Anesthesia Chart Review:  Case:  [id number] Date/Time:  06/14/18 0815   Procedures:      VIDEO BRONCHOSCOPY (N/A )     VIDEO ASSISTED THORACOSCOPY (VATS)/LUNG RESECTION (Right Chest)   Anesthesia type:  General   Pre-op diagnosis:  RLL LUNG LESION   Location:  [facility] OR ROOM 14 / [facility]   Surgeon:  [name], MD      DISCUSSION: Patient is a 78 year old male scheduled for the above procedure. Core biopsy of RLL lung lesion on 04/14/18 revealed adenocarcinoma with lepidic pattern.   History includes former smoker (quit 1974), HTN, GERD, prostate cancer (s/p I-125 prostate seed implantation 09/29/17), skin cancer (BCC, SCC), asthma.  Dr. Servando Snare classified his Zubrod Score as 0 (normal activity, no symptoms). EKG stable.   Negative preoperative COVID test 06/12/18.  If no acute changes then I would anticipate that he can proceed as planned.   VS: BP 135/76 Comment: manually rechecked after patient had been sitting, EF, RN  Pulse 69   Temp 36.4 C   Resp 18   Ht 5\' 9"  (1.753 m)   Wt 89 kg   SpO2 100%   BMI 28.96 kg/m  174/80, recheck 135/76.   PROVIDERS: [name], MD is PCP ([place]) Franchot Gallo, MD is urologist   LABS: Labs reviewed: Acceptable for surgery. (all labs ordered are listed, but only abnormal results are displayed)  Labs Reviewed  BLOOD GAS, ARTERIAL - Abnormal; Notable for the following components:      Result Value   pO2, Arterial 139 (*)    All other components within normal limits  CBC - Abnormal; Notable for the following components:   RBC 3.90 (*)    Hemoglobin 12.2 (*)    HCT 37.9 (*)    All other components within normal limits  COMPREHENSIVE METABOLIC PANEL - Abnormal; Notable for the following components:   CO2 21 (*)    Glucose, Bld 107 (*)    Total Protein 5.9 (*)    Albumin 3.2 (*)    All other components within normal limits  URINALYSIS, ROUTINE W REFLEX MICROSCOPIC - Abnormal; Notable for the following  components:   Color, Urine STRAW (*)    All other components within normal limits  SURGICAL PCR SCREEN  APTT  PROTIME-INR  TYPE AND SCREEN    PFTs 05/30/18: FVC 3.70 (91%), post 3.57 (88%) FEV1 2.64 (90%), post 2.68 (92%). DLCO unc 20.89 (86%)   IMAGES: CXR 06/12/18: IMPRESSION: No acute abnormality of the lungs.  Mild cardiomegaly.  CT Super D Chest 03/09/18: IMPRESSION: 1. Solid and sub solid ground-glass nodule in the RIGHT lower lobe is not changed in size from comparison exam however the superior nodular component appears more prominent. Consider tissue sampling of this suspicious morphology versus continued CT surveillance. 2. No lymphadenopathy.   EKG: 06/12/18: Normal sinus rhythm Nonspecific ST abnormality Abnormal ECG No significant change since 2019 Confirmed by Jenkins [place] [phone]) on 06/12/2018 12:16:49 PM - non-specific T wave abnormality most apparent in III, aVF and stable when compared to 08/26/17 tracing   CV: N/A   Past Medical History:  Diagnosis Date  . Arthritis   . Asthma 2005   "attack x 1"  . BCC (basal cell carcinoma of skin) 04/15/2008   Right Forehead  . BCC (basal cell carcinoma of skin) 08/26/2016   Mid Forehead  . BPH (benign prostatic hyperplasia)   . Erectile dysfunction   . GERD (gastroesophageal reflux disease)   .  Headache    "Stabbing"  . Hematuria 12/2016  . History of acute prostatitis 12/2016  . Hypertension   . Lipoma 02/08/2014   Small subcutaneous mass right temporal region, noted on MRI Brain, pt unaware  . Nodular basal cell carcinoma (BCC) 07/20/2016   Mid Forehead  . Nodular basal cell carcinoma (BCC) 05/26/2017   Left Nare  . Nodule of right lung 08/26/2017   13mm ground glass posterior nodule, noted on CT chest  . Nodulo-ulcerative basal cell carcinoma (BCC) 10/21/2016   Right Shin  . Osteoarthritis of left hip 08/07/2012  . Osteoarthritis of right hip 05/23/2012  . Prostate cancer ([place])   . Renal cyst,  right 06/27/2017   Large peripelvic, noted on [facility] Bone scan  . SCC (squamous cell carcinoma) 01/27/2009   Left Outer Brow - Well Diff  . Seasonal allergies   . Skin cancer   . Squamous cell carcinoma in situ (SCCIS) 07/27/2013   Left Forehead  . Superficial basal cell carcinoma (BCC) 10/14/2014   Tip of Nose  . Wears hearing aid     Past Surgical History:  Procedure Laterality Date  . COLONOSCOPY  09/2017  . CYSTOSCOPY N/A 09/29/2017   Procedure: CYSTOSCOPY;  Surgeon: Franchot Gallo, MD;  Location[facility];  Service: Urology;  Laterality: N/A;  no seeds in bladder per Dr Diona Fanti  . KNEE ARTHROSCOPY Left   . LIPOMA EXCISION     multiple  . RADIOACTIVE SEED IMPLANT N/A 09/29/2017   Procedure: RADIOACTIVE SEED IMPLANT/BRACHYTHERAPY IMPLANT;  Surgeon: Franchot Gallo, MD;  Location[facility];  Service: Urology;  Laterality: N/A;  63 seeds  . SHOULDER ARTHROSCOPY Right   . SPACE OAR INSTILLATION N/A 09/29/2017   Procedure: SPACE OAR INSTILLATION;  Surgeon: Franchot Gallo, MD;  Location: [facility];  Service: Urology;  Laterality: N/A;  . TONSILLECTOMY    . TOTAL HIP ARTHROPLASTY Right 05/23/2012   Procedure: TOTAL HIP ARTHROPLASTY;  Surgeon: Johnny Bridge, MD;  Location: [facility];  Service: Orthopedics;  Laterality: Right;  . TOTAL HIP ARTHROPLASTY Left 08/07/2012   Procedure: TOTAL HIP ARTHROPLASTY- left ;  Surgeon: Johnny Bridge, MD;  Location: [facility];  Service: Orthopedics;  Laterality: Left;    MEDICATIONS: . acetaminophen (TYLENOL) 500 MG tablet  . CALCIUM-MAGNESIUM-VITAMIN D PO  . famotidine (PEPCID) 20 MG tablet  . hydrochlorothiazide (MICROZIDE) 12.5 MG capsule  . leuprolide (LUPRON) 30 MG injection  . lisinopril (PRINIVIL,ZESTRIL) 20 MG tablet  . Meth-Hyo-M Bl-Na Phos-Ph Sal (URO-MP) 118 MG CAPS  . Multiple Vitamin (MULTIVITAMIN WITH MINERALS) TABS tablet  . Multiple Vitamins-Minerals (ZINC PO)  . naproxen  sodium (ALEVE) 220 MG tablet  . phenylephrine (NEO-SYNEPHRINE) 1 % nasal spray  . senna (SENOKOT) 8.6 MG TABS tablet  . tadalafil (ADCIRCA/CIALIS) 20 MG tablet  . vitamin C (ASCORBIC ACID) 500 MG tablet   No current facility-administered medications for this encounter.     Myra Gianotti, PA-C Surgical Short Stay/Anesthesiology [facility] Phone [phone] [facility] Phone [phone] 06/12/2018 4:08 PM

## 2018-06-12 NOTE — Anesthesia Preprocedure Evaluation (Addendum)
Anesthesia Evaluation  Patient identified by MRN, date of birth, ID band Patient awake    Reviewed: Allergy & Precautions, NPO status , Patient's Chart, lab work & pertinent test results  Airway Mallampati: II  TM Distance: >3 FB Neck ROM: Full    Dental no notable dental hx. (+) Teeth Intact   Pulmonary former smoker,    Pulmonary exam normal breath sounds clear to auscultation       Cardiovascular Exercise Tolerance: Good hypertension, Pt. on medications Normal cardiovascular exam Rhythm:Regular Rate:Normal  EKG 06/12/18 SR r65 W NSSTchanges   Neuro/Psych    GI/Hepatic Neg liver ROS, GERD  ,  Endo/Other    Renal/GU Renal disease     Musculoskeletal   Abdominal   Peds  Hematology   Anesthesia Other Findings   Reproductive/Obstetrics                          This SmartLink has not been configured with any valid records.   Lab Results  Component Value Date   WBC 4.9 06/12/2018   HGB 12.2 (L) 06/12/2018   HCT 37.9 (L) 06/12/2018   MCV 97.2 06/12/2018   PLT 153 06/12/2018   Lab Results  Component Value Date   CREATININE 0.74 06/12/2018   BUN 14 06/12/2018   NA 135 06/12/2018   K 3.7 06/12/2018   CL 104 06/12/2018   CO2 21 (L) 06/12/2018     Anesthesia Physical Anesthesia Plan  ASA: III  Anesthesia Plan: General   Post-op Pain Management:    Induction: Intravenous  PONV Risk Score and Plan: 3 and Treatment may vary due to age or medical condition, Ondansetron and Dexamethasone  Airway Management Planned: Double Lumen EBT  Additional Equipment: Arterial line  Intra-op Plan:   Post-operative Plan: Extubation in OR  Informed Consent:     Dental advisory given  Plan Discussed with:   Anesthesia Plan Comments: (PAT note written 06/12/2018 by Myra Gianotti, PA-C GA w 2 (16g ) IV + aline . )       Anesthesia Quick Evaluation

## 2018-06-14 ENCOUNTER — Inpatient Hospital Stay (HOSPITAL_COMMUNITY)
Admission: RE | Admit: 2018-06-14 | Discharge: 2018-06-19 | DRG: 164 | Disposition: A | Discharge status: Home / Self Care | Payer: Medicare Other | Attending: Cardiothoracic Surgery | Admitting: Cardiothoracic Surgery

## 2018-06-14 ENCOUNTER — Inpatient Hospital Stay (HOSPITAL_COMMUNITY): Payer: Medicare Other

## 2018-06-14 ENCOUNTER — Inpatient Hospital Stay (HOSPITAL_COMMUNITY): Payer: Medicare Other | Admitting: Vascular Surgery

## 2018-06-14 ENCOUNTER — Inpatient Hospital Stay (HOSPITAL_COMMUNITY): Payer: Medicare Other | Admitting: Certified Registered Nurse Anesthetist

## 2018-06-14 ENCOUNTER — Other Ambulatory Visit: Payer: Self-pay

## 2018-06-14 ENCOUNTER — Encounter (HOSPITAL_COMMUNITY): Payer: Self-pay | Admitting: Critical Care Medicine

## 2018-06-14 ENCOUNTER — Encounter (HOSPITAL_COMMUNITY)
Admission: RE | Disposition: A | Discharge status: Home / Self Care | Payer: Self-pay | Source: Home / Self Care | Attending: Cardiothoracic Surgery

## 2018-06-14 DIAGNOSIS — Z8546 Personal history of malignant neoplasm of prostate: Secondary | ICD-10-CM | POA: Diagnosis not present

## 2018-06-14 DIAGNOSIS — D62 Acute posthemorrhagic anemia: Secondary | ICD-10-CM | POA: Diagnosis not present

## 2018-06-14 DIAGNOSIS — C3431 Malignant neoplasm of lower lobe, right bronchus or lung: Principal | ICD-10-CM | POA: Diagnosis present

## 2018-06-14 DIAGNOSIS — I10 Essential (primary) hypertension: Secondary | ICD-10-CM | POA: Diagnosis present

## 2018-06-14 DIAGNOSIS — Z809 Family history of malignant neoplasm, unspecified: Secondary | ICD-10-CM | POA: Diagnosis not present

## 2018-06-14 DIAGNOSIS — Z87891 Personal history of nicotine dependence: Secondary | ICD-10-CM

## 2018-06-14 DIAGNOSIS — Z880 Allergy status to penicillin: Secondary | ICD-10-CM

## 2018-06-14 DIAGNOSIS — Z91048 Other nonmedicinal substance allergy status: Secondary | ICD-10-CM | POA: Diagnosis not present

## 2018-06-14 DIAGNOSIS — Z96643 Presence of artificial hip joint, bilateral: Secondary | ICD-10-CM | POA: Diagnosis present

## 2018-06-14 DIAGNOSIS — J95811 Postprocedural pneumothorax: Secondary | ICD-10-CM | POA: Diagnosis not present

## 2018-06-14 DIAGNOSIS — Z974 Presence of external hearing-aid: Secondary | ICD-10-CM

## 2018-06-14 DIAGNOSIS — Z923 Personal history of irradiation: Secondary | ICD-10-CM | POA: Diagnosis not present

## 2018-06-14 DIAGNOSIS — R911 Solitary pulmonary nodule: Secondary | ICD-10-CM

## 2018-06-14 DIAGNOSIS — N400 Enlarged prostate without lower urinary tract symptoms: Secondary | ICD-10-CM | POA: Diagnosis present

## 2018-06-14 DIAGNOSIS — Z4682 Encounter for fitting and adjustment of non-vascular catheter: Secondary | ICD-10-CM

## 2018-06-14 DIAGNOSIS — Z9689 Presence of other specified functional implants: Secondary | ICD-10-CM

## 2018-06-14 DIAGNOSIS — K219 Gastro-esophageal reflux disease without esophagitis: Secondary | ICD-10-CM | POA: Diagnosis present

## 2018-06-14 DIAGNOSIS — M1990 Unspecified osteoarthritis, unspecified site: Secondary | ICD-10-CM | POA: Diagnosis present

## 2018-06-14 DIAGNOSIS — Z20828 Contact with and (suspected) exposure to other viral communicable diseases: Secondary | ICD-10-CM | POA: Diagnosis present

## 2018-06-14 DIAGNOSIS — E8770 Fluid overload, unspecified: Secondary | ICD-10-CM | POA: Diagnosis not present

## 2018-06-14 DIAGNOSIS — Z85828 Personal history of other malignant neoplasm of skin: Secondary | ICD-10-CM | POA: Diagnosis not present

## 2018-06-14 DIAGNOSIS — R918 Other nonspecific abnormal finding of lung field: Secondary | ICD-10-CM | POA: Diagnosis present

## 2018-06-14 DIAGNOSIS — Z902 Acquired absence of lung [part of]: Secondary | ICD-10-CM

## 2018-06-14 DIAGNOSIS — C3430 Malignant neoplasm of lower lobe, unspecified bronchus or lung: Secondary | ICD-10-CM | POA: Diagnosis present

## 2018-06-14 DIAGNOSIS — Z888 Allergy status to other drugs, medicaments and biological substances status: Secondary | ICD-10-CM

## 2018-06-14 HISTORY — PX: VIDEO ASSISTED THORACOSCOPY (VATS)/WEDGE RESECTION: SHX6174

## 2018-06-14 HISTORY — PX: LYMPH NODE DISSECTION: SHX5087

## 2018-06-14 HISTORY — PX: INTERCOSTAL NERVE BLOCK: SHX5021

## 2018-06-14 LAB — GLUCOSE, CAPILLARY
Glucose-Capillary: 156 mg/dL — ABNORMAL HIGH (ref 70–99)
Glucose-Capillary: 184 mg/dL — ABNORMAL HIGH (ref 70–99)
Glucose-Capillary: 147 mg/dL — ABNORMAL HIGH (ref 70–99)

## 2018-06-14 SURGERY — VIDEO ASSISTED THORACOSCOPY (VATS)/WEDGE RESECTION
Anesthesia: General | Site: Chest | Laterality: Right

## 2018-06-14 MED ORDER — ONDANSETRON HCL 4 MG/2ML IJ SOLN
INTRAMUSCULAR | Status: DC | PRN
  Administered 2018-06-14: 4 mg via INTRAVENOUS

## 2018-06-14 MED ORDER — ONDANSETRON HCL 4 MG/2ML IJ SOLN: 4.0000 mg | Freq: Once | INTRAMUSCULAR | Status: DC | PRN

## 2018-06-14 MED ORDER — POTASSIUM CHLORIDE 10 MEQ/50ML IV SOLN: 10.0000 meq | Freq: Every day | INTRAVENOUS | Status: DC | PRN

## 2018-06-14 MED ORDER — MIDAZOLAM HCL 2 MG/2ML IJ SOLN
INTRAMUSCULAR | Status: AC
  Filled 2018-06-14: qty 2

## 2018-06-14 MED ORDER — FENTANYL CITRATE (PF) 250 MCG/5ML IJ SOLN
INTRAMUSCULAR | Status: DC | PRN
  Administered 2018-06-14 (×2): 50 ug via INTRAVENOUS
  Administered 2018-06-14 (×2): 25 ug via INTRAVENOUS
  Administered 2018-06-14: 100 ug via INTRAVENOUS
  Administered 2018-06-14 (×2): 25 ug via INTRAVENOUS
  Administered 2018-06-14: 50 ug via INTRAVENOUS

## 2018-06-14 MED ORDER — BUPIVACAINE HCL (PF) 0.5 % IJ SOLN
INTRAMUSCULAR | Status: AC
  Filled 2018-06-14: qty 30

## 2018-06-14 MED ORDER — ROCURONIUM BROMIDE 10 MG/ML (PF) SYRINGE
PREFILLED_SYRINGE | INTRAVENOUS | Status: DC | PRN
  Administered 2018-06-14: 60 mg via INTRAVENOUS
  Administered 2018-06-14 (×2): 20 mg via INTRAVENOUS

## 2018-06-14 MED ORDER — HYDROMORPHONE HCL 1 MG/ML IJ SOLN
INTRAMUSCULAR | Status: AC
  Filled 2018-06-14: qty 2

## 2018-06-14 MED ORDER — ACETAMINOPHEN 500 MG PO TABS
1000.0000 mg | ORAL_TABLET | Freq: Four times a day (QID) | ORAL | Status: AC
  Administered 2018-06-14 – 2018-06-18 (×16): 1000 mg via ORAL
  Filled 2018-06-14 (×15): qty 2

## 2018-06-14 MED ORDER — VANCOMYCIN HCL IN DEXTROSE 1-5 GM/200ML-% IV SOLN
1000.0000 mg | Freq: Two times a day (BID) | INTRAVENOUS | Status: AC
  Administered 2018-06-14: 1000 mg via INTRAVENOUS
  Filled 2018-06-14: qty 200

## 2018-06-14 MED ORDER — PROPOFOL 10 MG/ML IV BOLUS
INTRAVENOUS | Status: DC | PRN
  Administered 2018-06-14: 50 mg via INTRAVENOUS
  Administered 2018-06-14: 150 mg via INTRAVENOUS
  Administered 2018-06-14 (×4): 50 mg via INTRAVENOUS

## 2018-06-14 MED ORDER — SENNOSIDES-DOCUSATE SODIUM 8.6-50 MG PO TABS
1.0000 | ORAL_TABLET | Freq: Every day | ORAL | Status: DC
  Administered 2018-06-14 – 2018-06-18 (×5): 1 via ORAL
  Filled 2018-06-14 (×5): qty 1

## 2018-06-14 MED ORDER — OXYCODONE HCL 5 MG PO TABS
5.0000 mg | ORAL_TABLET | ORAL | Status: DC | PRN
  Administered 2018-06-14: 10 mg via ORAL
  Administered 2018-06-14: 5 mg via ORAL
  Administered 2018-06-15 – 2018-06-16 (×9): 10 mg via ORAL
  Administered 2018-06-17 (×4): 5 mg via ORAL
  Administered 2018-06-17: 10 mg via ORAL
  Administered 2018-06-17 – 2018-06-19 (×6): 5 mg via ORAL
  Filled 2018-06-14: qty 1
  Filled 2018-06-14 (×2): qty 2
  Filled 2018-06-14: qty 1
  Filled 2018-06-14: qty 2
  Filled 2018-06-14: qty 1
  Filled 2018-06-14: qty 2
  Filled 2018-06-14: qty 1
  Filled 2018-06-14: qty 2
  Filled 2018-06-14 (×2): qty 1
  Filled 2018-06-14 (×2): qty 2
  Filled 2018-06-14: qty 1
  Filled 2018-06-14 (×2): qty 2
  Filled 2018-06-14: qty 1
  Filled 2018-06-14: qty 2
  Filled 2018-06-14: qty 1
  Filled 2018-06-14: qty 2
  Filled 2018-06-14 (×2): qty 1

## 2018-06-14 MED ORDER — SUGAMMADEX SODIUM 200 MG/2ML IV SOLN
INTRAVENOUS | Status: DC | PRN
  Administered 2018-06-14: 200 mg via INTRAVENOUS

## 2018-06-14 MED ORDER — DIPHENHYDRAMINE HCL 50 MG/ML IJ SOLN: 12.5000 mg | Freq: Four times a day (QID) | INTRAMUSCULAR | Status: DC | PRN

## 2018-06-14 MED ORDER — NALOXONE HCL 0.4 MG/ML IJ SOLN: 0.4000 mg | INTRAMUSCULAR | Status: DC | PRN

## 2018-06-14 MED ORDER — VANCOMYCIN HCL IN DEXTROSE 1-5 GM/200ML-% IV SOLN
1000.0000 mg | INTRAVENOUS | Status: AC
  Administered 2018-06-14: 07:00:00 1000 mg via INTRAVENOUS

## 2018-06-14 MED ORDER — FENTANYL CITRATE (PF) 250 MCG/5ML IJ SOLN
INTRAMUSCULAR | Status: AC
  Filled 2018-06-14: qty 5

## 2018-06-14 MED ORDER — FENTANYL 40 MCG/ML IV SOLN
INTRAVENOUS | Status: AC
  Administered 2018-06-14: 30 ug via INTRAVENOUS
  Administered 2018-06-14: 210 ug via INTRAVENOUS
  Administered 2018-06-15: 90 ug via INTRAVENOUS
  Administered 2018-06-15: 75 ug via INTRAVENOUS
  Administered 2018-06-15: 90 ug via INTRAVENOUS
  Administered 2018-06-15: 75 ug via INTRAVENOUS
  Administered 2018-06-16 (×3): 30 ug via INTRAVENOUS
  Administered 2018-06-16: 45 ug via INTRAVENOUS
  Administered 2018-06-16 – 2018-06-17 (×4): 0 ug via INTRAVENOUS
  Filled 2018-06-14: qty 25

## 2018-06-14 MED ORDER — LACTATED RINGERS IV SOLN
INTRAVENOUS | Status: DC
  Administered 2018-06-14 (×3): via INTRAVENOUS

## 2018-06-14 MED ORDER — PROPOFOL 10 MG/ML IV BOLUS
INTRAVENOUS | Status: AC
  Filled 2018-06-14: qty 20

## 2018-06-14 MED ORDER — ENOXAPARIN SODIUM 40 MG/0.4ML ~~LOC~~ SOLN
40.0000 mg | SUBCUTANEOUS | Status: DC
  Administered 2018-06-15 – 2018-06-19 (×5): 40 mg via SUBCUTANEOUS
  Filled 2018-06-14 (×5): qty 0.4

## 2018-06-14 MED ORDER — SODIUM CHLORIDE 0.9% FLUSH: 9.0000 mL | INTRAVENOUS | Status: DC | PRN

## 2018-06-14 MED ORDER — ONDANSETRON HCL 4 MG/2ML IJ SOLN
4.0000 mg | Freq: Four times a day (QID) | INTRAMUSCULAR | Status: DC | PRN
  Administered 2018-06-15: 4 mg via INTRAVENOUS

## 2018-06-14 MED ORDER — SODIUM CHLORIDE (PF) 0.9 % IJ SOLN
INTRAMUSCULAR | Status: DC | PRN
  Administered 2018-06-14: 50 mL

## 2018-06-14 MED ORDER — ALUM & MAG HYDROXIDE-SIMETH 200-200-20 MG/5ML PO SUSP
30.0000 mL | ORAL | Status: DC | PRN
  Administered 2018-06-14 – 2018-06-18 (×3): 30 mL via ORAL
  Filled 2018-06-14 (×3): qty 30

## 2018-06-14 MED ORDER — TRAMADOL HCL 50 MG PO TABS: 50.0000 mg | ORAL_TABLET | Freq: Four times a day (QID) | ORAL | Status: DC | PRN

## 2018-06-14 MED ORDER — HYDROMORPHONE HCL 1 MG/ML IJ SOLN
0.5000 mg | INTRAMUSCULAR | Status: AC | PRN
  Administered 2018-06-14 (×4): 0.5 mg via INTRAVENOUS

## 2018-06-14 MED ORDER — SODIUM CHLORIDE 0.9 % IV SOLN
INTRAVENOUS | Status: DC | PRN
  Administered 2018-06-14: 25 ug/min via INTRAVENOUS

## 2018-06-14 MED ORDER — LACTATED RINGERS IV SOLN
INTRAVENOUS | Status: DC | PRN
  Administered 2018-06-14: 08:00:00 via INTRAVENOUS

## 2018-06-14 MED ORDER — HYDROMORPHONE HCL 1 MG/ML IJ SOLN: 0.2500 mg | INTRAMUSCULAR | Status: DC | PRN

## 2018-06-14 MED ORDER — 0.9 % SODIUM CHLORIDE (POUR BTL) OPTIME
TOPICAL | Status: DC | PRN
  Administered 2018-06-14: 1000 mL

## 2018-06-14 MED ORDER — INSULIN ASPART 100 UNIT/ML ~~LOC~~ SOLN
0.0000 [IU] | SUBCUTANEOUS | Status: DC
  Administered 2018-06-14: 20:00:00 4 [IU] via SUBCUTANEOUS
  Administered 2018-06-15 (×2): 2 [IU] via SUBCUTANEOUS
  Filled 2018-06-14: qty 0.24

## 2018-06-14 MED ORDER — FENTANYL 40 MCG/ML IV SOLN
INTRAVENOUS | Status: DC
  Administered 2018-06-14: 13:00:00 1000 ug via INTRAVENOUS
  Filled 2018-06-14 (×2): qty 0
  Filled 2018-06-14: qty 1
  Filled 2018-06-14 (×3): qty 0

## 2018-06-14 MED ORDER — VANCOMYCIN HCL IN DEXTROSE 1-5 GM/200ML-% IV SOLN
INTRAVENOUS | Status: AC
  Administered 2018-06-14: 1000 mg via INTRAVENOUS
  Filled 2018-06-14: qty 200

## 2018-06-14 MED ORDER — BUPIVACAINE LIPOSOME 1.3 % IJ SUSP
INTRAMUSCULAR | Status: DC | PRN
  Administered 2018-06-14: 10:00:00

## 2018-06-14 MED ORDER — VANCOMYCIN HCL 1000 MG IV SOLR
INTRAVENOUS | Status: DC | PRN
  Administered 2018-06-14: 08:00:00 1000 mg via INTRAVENOUS

## 2018-06-14 MED ORDER — LIDOCAINE 2% (20 MG/ML) 5 ML SYRINGE
INTRAMUSCULAR | Status: DC | PRN
  Administered 2018-06-14: 100 mg via INTRAVENOUS

## 2018-06-14 MED ORDER — MIDAZOLAM HCL 5 MG/5ML IJ SOLN
INTRAMUSCULAR | Status: DC | PRN
  Administered 2018-06-14 (×2): 1 mg via INTRAVENOUS

## 2018-06-14 MED ORDER — DEXAMETHASONE SODIUM PHOSPHATE 10 MG/ML IJ SOLN
INTRAMUSCULAR | Status: DC | PRN
  Administered 2018-06-14: 10 mg via INTRAVENOUS

## 2018-06-14 MED ORDER — OXYCODONE HCL 5 MG PO TABS: 5.0000 mg | ORAL_TABLET | Freq: Once | ORAL | Status: DC | PRN

## 2018-06-14 MED ORDER — DIPHENHYDRAMINE HCL 12.5 MG/5ML PO ELIX
12.5000 mg | ORAL_SOLUTION | Freq: Four times a day (QID) | ORAL | Status: DC | PRN
  Filled 2018-06-14: qty 5

## 2018-06-14 MED ORDER — BISACODYL 5 MG PO TBEC
10.0000 mg | DELAYED_RELEASE_TABLET | Freq: Every day | ORAL | Status: DC
  Administered 2018-06-15 – 2018-06-19 (×5): 10 mg via ORAL
  Filled 2018-06-14 (×5): qty 2

## 2018-06-14 MED ORDER — ACETAMINOPHEN 160 MG/5ML PO SOLN: 1000.0000 mg | Freq: Four times a day (QID) | ORAL | Status: AC

## 2018-06-14 MED ORDER — OXYCODONE HCL 5 MG/5ML PO SOLN: 5.0000 mg | Freq: Once | ORAL | Status: DC | PRN

## 2018-06-14 MED ORDER — ONDANSETRON HCL 4 MG/2ML IJ SOLN
4.0000 mg | Freq: Four times a day (QID) | INTRAMUSCULAR | Status: DC | PRN
  Filled 2018-06-14: qty 2

## 2018-06-14 MED ORDER — SUCCINYLCHOLINE CHLORIDE 200 MG/10ML IV SOSY
PREFILLED_SYRINGE | INTRAVENOUS | Status: DC | PRN
  Administered 2018-06-14: 80 mg via INTRAVENOUS

## 2018-06-14 MED ORDER — PHENYLEPHRINE 40 MCG/ML (10ML) SYRINGE FOR IV PUSH (FOR BLOOD PRESSURE SUPPORT)
PREFILLED_SYRINGE | INTRAVENOUS | Status: DC | PRN
  Administered 2018-06-14 (×2): 40 ug via INTRAVENOUS

## 2018-06-14 MED ORDER — DEXTROSE-NACL 5-0.45 % IV SOLN
INTRAVENOUS | Status: DC
  Administered 2018-06-14: 13:00:00 via INTRAVENOUS

## 2018-06-14 SURGICAL SUPPLY — 85 items
ADAPTER VALVE BIOPSY EBUS (MISCELLANEOUS) IMPLANT
ADPTR VALVE BIOPSY EBUS (MISCELLANEOUS)
CANISTER SUCT 3000ML PPV (MISCELLANEOUS) ×4 IMPLANT
CATH THORACIC 28FR (CATHETERS) ×4 IMPLANT
CATH THORACIC 36FR (CATHETERS) IMPLANT
CATH THORACIC 36FR RT ANG (CATHETERS) IMPLANT
CLIP VESOCCLUDE MED 6/CT (CLIP) ×4 IMPLANT
CONN ST 1/4X3/8  BEN (MISCELLANEOUS) ×1
CONN ST 1/4X3/8 BEN (MISCELLANEOUS) ×3 IMPLANT
CONT SPEC 4OZ CLIKSEAL STRL BL (MISCELLANEOUS) ×28 IMPLANT
COVER BACK TABLE 60X90IN (DRAPES) ×4 IMPLANT
COVER WAND RF STERILE (DRAPES) IMPLANT
DERMABOND ADVANCED (GAUZE/BANDAGES/DRESSINGS)
DERMABOND ADVANCED .7 DNX12 (GAUZE/BANDAGES/DRESSINGS) IMPLANT
DISSECTOR BLUNT TIP ENDO 5MM (MISCELLANEOUS) IMPLANT
DRAIN CHANNEL 28F RND 3/8 FF (WOUND CARE) ×4 IMPLANT
DRAIN CHANNEL 32F RND 10.7 FF (WOUND CARE) IMPLANT
DRAPE LAPAROSCOPIC ABDOMINAL (DRAPES) ×4 IMPLANT
DRILL BIT 7/64X5 (BIT) IMPLANT
ELECT BLADE 4.0 EZ CLEAN MEGAD (MISCELLANEOUS) ×4
ELECT BLADE 6.5 EXT (BLADE) ×4 IMPLANT
ELECT REM PT RETURN 9FT ADLT (ELECTROSURGICAL) ×4
ELECTRODE BLDE 4.0 EZ CLN MEGD (MISCELLANEOUS) ×3 IMPLANT
ELECTRODE REM PT RTRN 9FT ADLT (ELECTROSURGICAL) ×3 IMPLANT
FORCEPS BIOP RJ4 1.8 (CUTTING FORCEPS) IMPLANT
GAUZE SPONGE 4X4 12PLY STRL (GAUZE/BANDAGES/DRESSINGS) ×4 IMPLANT
GLOVE BIO SURGEON STRL SZ 6.5 (GLOVE) ×8 IMPLANT
GOWN STRL REUS W/ TWL LRG LVL3 (GOWN DISPOSABLE) ×12 IMPLANT
GOWN STRL REUS W/TWL LRG LVL3 (GOWN DISPOSABLE) ×4
KIT BASIN OR (CUSTOM PROCEDURE TRAY) ×4 IMPLANT
KIT CLEAN ENDO COMPLIANCE (KITS) ×4 IMPLANT
KIT SUCTION CATH 14FR (SUCTIONS) ×4 IMPLANT
KIT TURNOVER KIT B (KITS) ×4 IMPLANT
MARKER SKIN DUAL TIP RULER LAB (MISCELLANEOUS) IMPLANT
NEEDLE HYPO 25GX1X1/2 BEV (NEEDLE) ×4 IMPLANT
NEEDLE SPNL 18GX3.5 QUINCKE PK (NEEDLE) ×4 IMPLANT
NS IRRIG 1000ML POUR BTL (IV SOLUTION) ×4 IMPLANT
OIL SILICONE PENTAX (PARTS (SERVICE/REPAIRS)) ×4 IMPLANT
PACK CHEST (CUSTOM PROCEDURE TRAY) ×4 IMPLANT
PAD ARMBOARD 7.5X6 YLW CONV (MISCELLANEOUS) ×12 IMPLANT
PASSER SUT SWANSON 36MM LOOP (INSTRUMENTS) IMPLANT
SCISSORS LAP 5X35 DISP (ENDOMECHANICALS) IMPLANT
SEALANT SURG COSEAL 4ML (VASCULAR PRODUCTS) IMPLANT
SEALANT SURG COSEAL 8ML (VASCULAR PRODUCTS) IMPLANT
SOLUTION ANTI FOG 6CC (MISCELLANEOUS) ×4 IMPLANT
SPONGE TONSIL TAPE 1 RFD (DISPOSABLE) IMPLANT
STAPLE RELOAD 45MM GOLD (STAPLE) ×24 IMPLANT
STAPLER ECHELON POWERED (MISCELLANEOUS) ×4 IMPLANT
STOPCOCK 4 WAY LG BORE MALE ST (IV SETS) ×4 IMPLANT
SUT PROLENE 3 0 SH DA (SUTURE) ×4 IMPLANT
SUT PROLENE 4 0 RB 1 (SUTURE)
SUT PROLENE 4-0 RB1 .5 CRCL 36 (SUTURE) IMPLANT
SUT SILK  1 MH (SUTURE) ×4
SUT SILK 1 MH (SUTURE) ×12 IMPLANT
SUT SILK 1 TIES 10X30 (SUTURE) ×4 IMPLANT
SUT SILK 2 0 SH (SUTURE) IMPLANT
SUT SILK 2 0SH CR/8 30 (SUTURE) IMPLANT
SUT STEEL 1 (SUTURE) IMPLANT
SUT VIC AB 0 CTX 18 (SUTURE) ×4 IMPLANT
SUT VIC AB 1 CTX 18 (SUTURE) ×4 IMPLANT
SUT VIC AB 1 CTX 36 (SUTURE)
SUT VIC AB 1 CTX36XBRD ANBCTR (SUTURE) IMPLANT
SUT VIC AB 2-0 CTX 36 (SUTURE) ×4 IMPLANT
SUT VIC AB 3-0 SH 8-18 (SUTURE) IMPLANT
SUT VIC AB 3-0 X1 27 (SUTURE) ×4 IMPLANT
SUT VICRYL 0 UR6 27IN ABS (SUTURE) IMPLANT
SUT VICRYL 2 TP 1 (SUTURE) ×4 IMPLANT
SYR 20ML ECCENTRIC (SYRINGE) ×4 IMPLANT
SYR 5ML LL (SYRINGE) ×4 IMPLANT
SYRINGE 60CC LL (MISCELLANEOUS) ×4 IMPLANT
SYSTEM SAHARA CHEST DRAIN ATS (WOUND CARE) ×4 IMPLANT
TAPE CLOTH SURG 4X10 WHT LF (GAUZE/BANDAGES/DRESSINGS) ×4 IMPLANT
TAPE UMBILICAL COTTON 1/8X30 (MISCELLANEOUS) IMPLANT
TOWEL GREEN STERILE (TOWEL DISPOSABLE) ×4 IMPLANT
TOWEL GREEN STERILE FF (TOWEL DISPOSABLE) ×4 IMPLANT
TRAP SPECIMEN MUCOUS 40CC (MISCELLANEOUS) IMPLANT
TRAY FOLEY MTR SLVR 16FR STAT (SET/KITS/TRAYS/PACK) ×4 IMPLANT
TROCAR BLADELESS 12MM (ENDOMECHANICALS) IMPLANT
TROCAR XCEL 12X100 BLDLESS (ENDOMECHANICALS) ×4 IMPLANT
TUBE CONNECTING 20X1/4 (TUBING) ×4 IMPLANT
TUBING EXTENTION W/L.L. (IV SETS) ×4 IMPLANT
VALVE BIOPSY  SINGLE USE (MISCELLANEOUS) ×1
VALVE BIOPSY SINGLE USE (MISCELLANEOUS) ×3 IMPLANT
VALVE SUCTION BRONCHIO DISP (MISCELLANEOUS) IMPLANT
WATER STERILE IRR 1000ML POUR (IV SOLUTION) ×4 IMPLANT

## 2018-06-14 NOTE — Anesthesia Procedure Notes (Signed)
Arterial Line Insertion Start/End6/03/2018 7:30 AM, 06/14/2018 7:35 AM Performed by: Wilburn Cornelia, CRNA, CRNA  Patient location: Pre-op. Preanesthetic checklist: patient identified, IV checked, site marked, risks and benefits discussed, surgical consent, monitors and equipment checked, pre-op evaluation, timeout performed and anesthesia consent Lidocaine 1% used for infiltration Left, radial was placed Catheter size: 20 G Hand hygiene performed  and maximum sterile barriers used  Allen's test indicative of satisfactory collateral circulation Attempts: 1 Procedure performed without using ultrasound guided technique. Following insertion, Biopatch and dressing applied. Post procedure assessment: normal

## 2018-06-14 NOTE — Brief Op Note (Signed)
.       [address]       [place] [address]             [phone]     06/14/2018  12:26 PM  PATIENT:  Frederick Hall  78 y.o. male  PRE-OPERATIVE DIAGNOSIS:  RLL LUNG LESION, adneocarcinoma  POST-OPERATIVE DIAGNOSIS:  RLL LUNG LESION, same   PROCEDURE:  Procedure(s): VIDEO ASSISTED THORACOSCOPY (VATS) WITH LUNG RESECTION OF SUPERIOR SEGMENT OF RIGHT LOWER LOBE (Right) Intercostal Nerve Block Lymph Node Dissection  SURGEON:  Surgeon(s) and Role:    * [name], MD - Primary  PHYSICIAN ASSISTANT:  Nicholes Rough, PA-C  ANESTHESIA:   general  EBL:  50 mL   BLOOD ADMINISTERED:none  DRAINS: ONE STRAIGHT DRAIN AND ONE BLAKE DRAIN   LOCAL MEDICATIONS USED:  BUPIVICAINE   SPECIMEN:  Source of Specimen:  RIGHT LOWER LOBE NODULE  DISPOSITION OF SPECIMEN:  PATHOLOGY  COUNTS:  YES  DICTATION: .Dragon Dictation  PLAN OF CARE: Admit to inpatient   PATIENT DISPOSITION:  pacu   Delay start of Pharmacological VTE agent (>24hrs) due to surgical blood loss or risk of bleeding: NO

## 2018-06-14 NOTE — Transfer of Care (Signed)
Immediate Anesthesia Transfer of Care Note  Patient: Frederick Hall  Procedure(s) Performed: VIDEO ASSISTED THORACOSCOPY (VATS) WITH LUNG RESECTION OF SUPERIOR SEGMENT OF RIGHT LOWER LOBE (Right Chest) Intercostal Nerve Block Lymph Node Dissection  Patient Location: PACU  Anesthesia Type:General  Level of Consciousness: awake, alert  and oriented  Airway & Oxygen Therapy: Patient Spontanous Breathing and Patient connected to nasal cannula oxygen  Post-op Assessment: Report given to RN and Post -op Vital signs reviewed and stable  Post vital signs: Reviewed and stable  Last Vitals:  Vitals Value Taken Time  BP 150/100 06/14/2018 12:07 PM  Temp    Pulse 76 06/14/2018 12:12 PM  Resp 16 06/14/2018 12:12 PM  SpO2 95 % 06/14/2018 12:12 PM  Vitals shown include unvalidated device data.  Last Pain:  Vitals:   06/14/18 0703  TempSrc:   PainSc: 0-No pain         Complications: No apparent anesthesia complications

## 2018-06-14 NOTE — H&P (Signed)
[address]       [place], [address]             [phone]                    Frederick Hall Date of Birth: 09-16-1940  Referring: Frederick Gallo, Hall Primary Care: Frederick Sacramento, Hall Primary Cardiologist: No primary care provider on file.   Chief Complaint  Patient presents with  . Lung Lesion         History of Present Illness:    Frederick Hall 78 y.o. male  Has been followed.  He has been evaluated for  groundglass opacity in the right lower lung in the past. The patient is a distant smoker having smoked for 10 to 12 years but he stopped smoking in 1976. The patient was recently diagnosed with prostate cancer in late summer 2019 by Dr. Diona Hall, CT of the abdomen was performed indicating a groundglass opacity 1.4 to 1.5 cm in size in the right lower lobe, to follow-up on this a CT scan of the chest was performed.  The patient was referred by Dr. Diona Hall.  Patient denies any respiratory symptoms, denies hemoptysis.  He has no significant occupational respiratory exposures that he is aware of.  in late February 2020 I recommended to him that we proceed with CT directed needle biopsy of the lesion he wanted to delay this for several weeks.  A CT directed needle biopsy biopsy was done , the final report the patient confirmed   Diagnosis Lung, needle/core biopsy(ies), Right lower lobe - ADENOCARCINOMA WITH A LEPIDIC PATTERN. SEE NOTE Diagnosis Note The lesion shows a predominantly lepidic (in situ) pattern. Definite evidence of invasion is not seen in the submitted biopsies but cannot be ruled out. Frederick Hall has reviewed this case and concurs with the above diagnosis.  Frederick Hall was paged on 04/18/2018 Frederick Hall   The patient is a distant smoker having smoked for 10 to 12 years but he stopped smoking in 1976.  He has no significant occupational respiratory exposures that he is  aware of.    Current Activity/ Functional Status:  Patient is independent with mobility/ambulation, transfers, ADL's, IADL's.   Zubrod Score: At the time of surgery this patient's most appropriate activity status/level should be described as: [x]     0    Normal activity, no symptoms []     1    Restricted in physical strenuous activity but ambulatory, able to do out light work []     2    Ambulatory and capable of self care, unable to do work activities, up and about               >50 % of waking hours                              []     3    Only limited self care, in bed greater than 50% of waking hours []     4    Completely disabled, no self care, confined to bed or chair []     5    Moribund   Past Medical History:  Diagnosis Date  . Arthritis   . Asthma 2005   "attack x 1"  . BCC (basal cell carcinoma of skin) 04/15/2008   Right Forehead  . BCC (basal cell carcinoma  of skin) 08/26/2016   Mid Forehead  . BPH (benign prostatic hyperplasia)   . Erectile dysfunction   . GERD (gastroesophageal reflux disease)   . Headache    "Stabbing"  . Hematuria 12/2016  . History of acute prostatitis 12/2016  . Hypertension   . Lipoma 02/08/2014   Small subcutaneous mass right temporal region, noted on MRI Brain, pt unaware  . Nodular basal cell carcinoma (BCC) 07/20/2016   Mid Forehead  . Nodular basal cell carcinoma (BCC) 05/26/2017   Left Nare  . Nodule of right lung 08/26/2017   46mm ground glass posterior nodule, noted on CT chest  . Nodulo-ulcerative basal cell carcinoma (BCC) 10/21/2016   Right Shin  . Osteoarthritis of left hip 08/07/2012  . Osteoarthritis of right hip 05/23/2012  . Prostate cancer ([place])   . Renal cyst, right 06/27/2017   Large peripelvic, noted on NM Bone scan  . SCC (squamous cell carcinoma) 01/27/2009   Left Outer Brow - Well Diff  . Seasonal allergies   . Skin cancer   . Squamous cell carcinoma in situ (SCCIS) 07/27/2013   Left Forehead  .  Superficial basal cell carcinoma (BCC) 10/14/2014   Tip of Nose  . Wears hearing aid     Past Surgical History:  Procedure Laterality Date  . COLONOSCOPY  09/2017  . CYSTOSCOPY N/A 09/29/2017   Procedure: CYSTOSCOPY;  Surgeon: Frederick Gallo, Hall;  Location: [facility];  Service: Urology;  Laterality: N/A;  no seeds in bladder per Dr Frederick Hall  . KNEE ARTHROSCOPY Left   . LIPOMA EXCISION     multiple  . RADIOACTIVE SEED IMPLANT N/A 09/29/2017   Procedure: RADIOACTIVE SEED IMPLANT/BRACHYTHERAPY IMPLANT;  Surgeon: Frederick Gallo, Hall;  Location: [facility];  Service: Urology;  Laterality: N/A;  63 seeds  . SHOULDER ARTHROSCOPY Right   . SPACE OAR INSTILLATION N/A 09/29/2017   Procedure: SPACE OAR INSTILLATION;  Surgeon: Frederick Gallo, Hall;  Location: [facility];  Service: Urology;  Laterality: N/A;  . TONSILLECTOMY    . TOTAL HIP ARTHROPLASTY Right 05/23/2012   Procedure: TOTAL HIP ARTHROPLASTY;  Surgeon: Johnny Bridge, Hall;  Location: [facility];  Service: Orthopedics;  Laterality: Right;  . TOTAL HIP ARTHROPLASTY Left 08/07/2012   Procedure: TOTAL HIP ARTHROPLASTY- left ;  Surgeon: Johnny Bridge, Hall;  Location: [facility];  Service: Orthopedics;  Laterality: Left;    Family History  Problem Relation Age of Onset  . Cancer Mother   . Cancer Father      Social History   Tobacco Use  Smoking Status Former Smoker  . Packs/day: 1.00  . Years: 15.00  . Pack years: 15.00  . Types: Cigarettes  . Last attempt to quit: 05/18/1972  . Years since quitting: 46.1  Smokeless Tobacco Never Used  Tobacco Comment   social alcohol    Social History   Substance and Sexual Activity  Alcohol Use Yes  . Alcohol/week: 7.0 standard drinks  . Types: 7 Standard drinks or equivalent per week   Comment: 3  wine or beer     Allergies  Allergen Reactions  . Penicillins Anaphylaxis, Itching and Rash    Childhood allergy. Tolerated cefazolin  05/23/12. Did it involve swelling of the face/tongue/throat, SOB, or low BP? Yes Did it involve sudden or severe rash/hives, skin peeling, or any reaction on the inside of your mouth or nose? No Did you need to seek medical attention at a hospital or doctor's office? Yes  When did it last happen?childhood allergy If all above answers are "NO", may proceed with cephalosporin use.    . Cefuroxime Axetil Diarrhea  . Other     general anesthesia - severe constipation   . Pollen Extract Itching    Seasonal allergies.    Current Facility-Administered Medications  Medication Dose Route Frequency Provider Last Rate Last Dose  . lactated ringers infusion   Intravenous Continuous Barnet Glasgow, Hall      . vancomycin (VANCOCIN) IVPB 1000 mg/200 mL premix  1,000 mg Intravenous On Call to OR [name], Hall        Pertinent items are noted in HPI.   Review of Systems:     Cardiac Review of Systems: [Y] = yes  or   [ N ] = no   Chest Pain [ n]  Resting SOB [ n ] Exertional SOB  [  n]  Orthopnea [n   Pedal Edema [ n]    Palpitations n ] Syncope  [ n   Presyncope [n]   General Review of Systems: [Y] = yes [  ]=no Constitional: recent weight change [  ];  Wt loss over the last 3 months [   ] anorexia [  ]; fatigue [  ]; nausea [  ]; night sweats [  ]; fever [  ]; or chills [  ];           Eye : blurred vision [  ]; diplopia [   ]; vision changes [  ];  Amaurosis fugax[  ]; Resp: cough [  ];  wheezing[  ];  hemoptysis[  ]; shortness of breath[  ]; paroxysmal nocturnal dyspnea[  ]; dyspnea on exertion[  ]; or orthopnea[  ];  GI:  gallstones[  ], vomiting[  ];  dysphagia[  ]; melena[  ];  hematochezia [  ]; heartburn[  ];   Hx of  Colonoscopy[  ]; GU: kidney stones [  ]; hematuria[  ];   dysuria [  ];  nocturia[  ];  history of     obstruction [  ]; urinary frequency [  ]             Skin: rash, swelling[  ];, hair loss[  ];  peripheral edema[  ];  or itching[  ]; Musculosketetal:  myalgias[  ];  joint swelling[  ];  joint erythema[  ];  joint pain[  ];  back pain[  ];  Heme/Lymph: bruising[  ];  bleeding[  ];  anemia[  ];  Neuro: TIA[  ];  headaches[  ];  stroke[  ];  vertigo[  ];  seizures[  ];   paresthesias[  ];  difficulty walking[  ];  Psych:depression[  ]; anxiety[  ];  Endocrine: diabetes[  ];  thyroid dysfunction[  ];  Immunizations: Flu up to date [y]; Pneumococcal up to date [ y ];  Other:     PHYSICAL EXAMINATION: General appearance: alert, cooperative and no distress Head: Normocephalic, without obvious abnormality, atraumatic Neck: no adenopathy, no carotid bruit, no JVD, supple, symmetrical, trachea midline and thyroid not enlarged, symmetric, no tenderness/mass/nodules Lymph nodes: Cervical, supraclavicular, and axillary nodes normal. Resp: clear to auscultation bilaterally Back: symmetric, no curvature. ROM normal. No CVA tenderness. Cardio: regular rate and rhythm, S1, S2 normal, no murmur, click, rub or gallop GI: soft, non-tender; bowel sounds normal; no masses,  no organomegaly Extremities: extremities normal, atraumatic, no cyanosis or edema and Homans sign is negative, no  sign of DVT Neurologic: Grossly normal  Diagnostic Studies & Laboratory data:     Recent Radiology Findings:  Dg Chest 2 View  Result Date: 06/12/2018 CLINICAL DATA:  Preoperative, right lower lobe nodule EXAM: CHEST - 2 VIEW COMPARISON:  04/14/2018 FINDINGS: Mild cardiomegaly. Both lungs are clear. The visualized skeletal structures are unremarkable. IMPRESSION: No acute abnormality of the lungs.  Mild cardiomegaly. Electronically Signed   By: Eddie Candle M.D.   On: 06/12/2018 10:11    Ct Super D Chest Wo Contrast  Result Date: 03/09/2018 CLINICAL DATA:  Indeterminate RIGHT lower lobe ground-glass nodule. EXAM: CT CHEST WITHOUT CONTRAST TECHNIQUE: Multidetector CT imaging of the chest was performed using thin slice collimation for electromagnetic bronchoscopy planning  purposes, without intravenous contrast. COMPARISON:  CT 08/04/2017 FINDINGS: Cardiovascular: No significant vascular findings. Normal heart size. No pericardial effusion. Mediastinum/Nodes: No axillary supraclavicular adenopathy. No mediastinal adenopathy. Pericardial effusion. Esophagus. Lungs/Pleura: In the RIGHT lower lobe, peripheral ground-glass nodule measures 21 x 14 mm (image 79/8) compared to 22 by 15 mm on prior remeasured for no change. There is a small solid nodular component more superiorly along the pleural surface (image 77/8). This superior nodular component appears more prominent on the coronal imaging (image 153/4). No additional suspicious pulmonary nodules. Upper Abdomen: Limited view of the liver, kidneys, pancreas are unremarkable. Normal adrenal glands. Musculoskeletal: No aggressive osseous lesion IMPRESSION: 1. Solid and sub solid ground-glass nodule in the RIGHT lower lobe is not changed in size from comparison exam however the superior nodular component appears more prominent. Consider tissue sampling of this suspicious morphology versus continued CT surveillance. 2. No lymphadenopathy. Electronically Signed   By: Suzy Bouchard M.D.   On: 03/09/2018 14:38      Final Report  CLINICAL DATA: Lung nodules on previous abdomen CT.  EXAM: CT CHEST WITHOUT CONTRAST  TECHNIQUE: Multidetector CT imaging of the chest was performed following the standard protocol without IV contrast.  COMPARISON: Abdomen and pelvis CT 06/27/2017  FINDINGS: Cardiovascular: The heart size is normal. No substantial pericardial effusion. Atherosclerotic calcification is noted in the wall of the thoracic aorta.  Mediastinum/Nodes: No mediastinal lymphadenopathy. There is no hilar lymphadenopathy. The esophagus has normal imaging features. There is no axillary lymphadenopathy.  Lungs/Pleura: The central tracheobronchial airways are patent. 6 mm perifissural nodule right lung (3:65) likely  subpleural lymph node. Similar appearance 18 mm ground-glass nodule posterior right lower lung (3:60). 4 mm nodule posterior right costophrenic sulcus is stable. There is atelectasis in the dependent lung bases bilaterally.  Upper Abdomen: Right renal cysts incompletely visualized.  Musculoskeletal: Unremarkable  IMPRESSION: 1. No change 18 mm ground-glass nodule posterior right lung. Adenocarcinoma cannot be excluded. Follow up by CT is recommended in 12 months, with continued annual surveillance for a minimum of 3 years. These recommendations are taken from:Recommendations for the Management of Subsolid Pulmonary Nodules Detected at CT: A Statement from the [facility] 2013; 266:1, 304-317. 2. Stable 4 mm posterior right lower lobe pulmonary nodule. Attention on follow-up.   Electronically Signed By: Misty Stanley M.D. On: 08/05/2017 10:50  Final Report  CLINICAL DATA: Prostate cancer.  EXAM: CT ABDOMEN AND PELVIS WITHOUT AND WITH CONTRAST  TECHNIQUE: Multidetector CT imaging of the abdomen and pelvis was performed following the standard protocol before and following the bolus administration of intravenous contrast.  CONTRAST: 100 cc Isovue 300.  COMPARISON: No comparison studies available.  FINDINGS: Lower chest: 4 mm nodule identified posterior right costophrenic sulcus (image 22/series 9). 17 mm  ground-glass nodule noted right lower lobe (image 3/series 9).  Hepatobiliary: No focal abnormality within the liver parenchyma. There is no evidence for gallstones, gallbladder wall thickening, or pericholecystic fluid. No intrahepatic or extrahepatic biliary dilation.  Pancreas: No focal mass lesion. No dilatation of the main duct. No intraparenchymal cyst. No peripancreatic edema.  Spleen: No splenomegaly. No focal mass lesion.  Adrenals/Urinary Tract: No adrenal nodule or mass.  Precontrast imaging shows no stones in either kidney or ureter  although distal ureters are obscured by streak artifact from bilateral hip replacement. Posterior bladder also obscured.  Imaging after IV contrast administration shows no suspicious enhancing lesion in either kidney. 6.1 x 6.6 cm simple cyst identified interpolar right kidney. Exophytic 6.2 x 6.4 cm cyst identified in the lower pole the left kidney. 15 mm lower pole left renal cyst noted posteriorly. Other scattered tiny hypoattenuating renal lesions are too small to characterize.  Delayed imaging shows mass-effect on the right intrarenal collecting system and renal pelvis due to the cyst. No evidence for soft tissue wall thickening or intraluminal soft tissue filling defect in either intrarenal collecting system or renal pelvis. Distal ureters have not been included on the delayed imaging.  Stomach/Bowel: Stomach is nondistended. No gastric wall thickening. No evidence of outlet obstruction. Duodenum is normally positioned as is the ligament of Treitz. No small bowel wall thickening. No small bowel dilatation. The terminal ileum is normal. The appendix is normal. Diverticuli are seen scattered along the entire length of the colon without CT findings of diverticulitis.  Vascular/Lymphatic: There is abdominal aortic atherosclerosis without aneurysm. There is no gastrohepatic or hepatoduodenal ligament lymphadenopathy. No intraperitoneal or retroperitoneal lymphadenopathy. No pelvic sidewall lymphadenopathy. Although portions of each pelvic sidewall are obscured by streak artifact.  Reproductive: Prostate gland obscured by streak artifact.  Other: No intraperitoneal free fluid.  Musculoskeletal: Small left groin hernia contains only fat. Patient is status post bilateral hip replacement. Bilateral pars interarticularis defects are noted at L5. No worrisome lytic or sclerotic osseous abnormality.  IMPRESSION: 1. No evidence for metastatic disease in the abdomen or pelvis in this  patient with reported history of prostate cancer. Of note, the inferior aspect of both pelvic sidewalls, the prostate gland, and the posterior bladder are obscured by streak artifact from bilateral hip replacement. 2. 17 mm ground-glass nodule posterior right lower lobe with associated tiny 4 mm nodule in the posterior right costophrenic sulcus. Initial follow-up with CT at 6-12 months is recommended to confirm persistence. If persistent, repeat CT is recommended every 2 years until 5 years of stability has been established. This recommendation follows the consensus statement: Guidelines for Management of Incidental Pulmonary Nodules Detected on CT Images: From the Fleischner Society 2017; Radiology 2017; 284:228-243. 3. Bilateral renal cysts with other renal lesions too small to characterize. 4. Small left groin hernia contains only fat. 5. Aortic Atherosclerois (ICD10-170.0)   Electronically Signed By: Misty Stanley M.D.   I have independently reviewed the above radiology studies  and reviewed the findings with the patient.   Recent Lab Findings: Lab Results  Component Value Date   WBC 4.9 06/12/2018   HGB 12.2 (L) 06/12/2018   HCT 37.9 (L) 06/12/2018   PLT 153 06/12/2018   GLUCOSE 107 (H) 06/12/2018   ALT 21 06/12/2018   AST 20 06/12/2018   NA 135 06/12/2018   K 3.7 06/12/2018   CL 104 06/12/2018   CREATININE 0.74 06/12/2018   BUN 14 06/12/2018   CO2 21 (L) 06/12/2018  INR 1.0 06/12/2018   PFT's FEV1 2.64 92% DLCO 20.89 86%  The FVC, FEV1, FEV1/FVC ratio and FEF25-75% are within normal limits, but there is curvature to the flow volume loop suggesting minimal small airway disease. Patient effort was erratic, but the normal FEV1 indicates no significant obstruction. The airway resistance is normal. Lung volumes are within normal limits. Following administration of bronchodilators, there is no significant response. The diffusing capacity is normal. However, the  diffusing capacity was not corrected for the patient's hemoglobin. Conclusions: Minimal airway obstruction is present suggesting small airway disease. Pulmonary Function Diagnosis: Minimal Obstructive Airways Disease   Assessment / Plan:   #1 solid and sub solid ground-glass nodule in the RIGHT lower lobe now confirmed as adenocarcinoma.  Most likely clinical stage I non-small cell lung cancer.  I discussed with the Patient treatment options including surgical resection versus stereotactic radiotherapy.  After previous discussion and follow-up discussion today the patient is agreeable to proceed with surgical resection, right VATS and possible segmentectomy with node dissection.    The goals risks and alternatives of the planned surgical procedure Procedure(s): VIDEO BRONCHOSCOPY (N/A) VIDEO ASSISTED THORACOSCOPY (VATS)/LUNG RESECTION (Right)  have been discussed with the patient in detail. The risks of the procedure including death, infection, stroke, myocardial infarction, bleeding, blood transfusion have all been discussed specifically.  I have quoted Frederick Hall a 2 % of perioperative mortality and a complication rate as high as 30 %. The patient's questions have been answered.Frederick Hall is willing  to proceed with the planned procedure.   [name] Hall      [address] [place] [address] Office [phone]   Beeper [phone]  06/14/2018 6:52 AM

## 2018-06-14 NOTE — Progress Notes (Signed)
Dr. Servando Snare at bedside; updated.  He spoke w/patient and wife via telephone.  No new orders.  Patient assisted back to bed per assist of one.  CT patent w/serosanguinous drainage.  Will continue to monitor closely.

## 2018-06-14 NOTE — Anesthesia Procedure Notes (Signed)
Procedure Name: Intubation Date/Time: 06/14/2018 8:34 AM Performed by: Wilburn Cornelia, CRNA Pre-anesthesia Checklist: Patient identified, Emergency Drugs available, Suction available, Timeout performed and Patient being monitored Patient Re-evaluated:Patient Re-evaluated prior to induction Oxygen Delivery Method: Circle system utilized Preoxygenation: Pre-oxygenation with 100% oxygen Induction Type: IV induction and Rapid sequence Ventilation: Oral airway inserted - appropriate to patient size Tube type: Oral Endobronchial tube: EBT position confirmed by fiberoptic bronchoscope and Bronchial Blocker placed under direct vision Tube size: 8.5 mm Number of attempts: 5 or more Airway Equipment and Method: Stylet,  Video-laryngoscopy,  Bougie stylet and Fiberoptic brochoscope Placement Confirmation: ETT inserted through vocal cords under direct vision,  positive ETCO2,  CO2 detector and breath sounds checked- equal and bilateral Secured at: 22 cm Tube secured with: Tape Dental Injury: Teeth and Oropharynx as per pre-operative assessment  Difficulty Due To: Difficulty was anticipated and Difficult Airway- due to anterior larynx Comments: DLx1 with MAC 4 - grade 4 view.  39 double lumen inserted - esophageal intubation.  Removed and mask ventilated.  DLx1 with glidescope 8.5 ETT inserted.  Bougie stylet passed through ETT and attempted exchange to Double lumen - unable to exchange over catheter. DLx1 with MAC 4 by Dr. Valma Cava.  DLx1 with MAC 4 by Dr. Conrad Linden.  Small amount of blood and swelling noted in posterior pharynx.  DLx1 with glidescope 8.5 ETT inserted and bronchial blocker placed for procedure.

## 2018-06-14 NOTE — Anesthesia Postprocedure Evaluation (Signed)
Anesthesia Post Note  Patient: Frederick Hall  Procedure(s) Performed: VIDEO ASSISTED THORACOSCOPY (VATS) WITH LUNG RESECTION OF SUPERIOR SEGMENT OF RIGHT LOWER LOBE (Right Chest) Intercostal Nerve Block Lymph Node Dissection     Patient location during evaluation: PACU Anesthesia Type: General Level of consciousness: awake and alert Pain management: pain level controlled Vital Signs Assessment: post-procedure vital signs reviewed and stable Respiratory status: spontaneous breathing, nonlabored ventilation, respiratory function stable and patient connected to nasal cannula oxygen Cardiovascular status: blood pressure returned to baseline and stable Postop Assessment: no apparent nausea or vomiting Anesthetic complications: no    Last Vitals:  Vitals:   06/14/18 1337 06/14/18 1419  BP: 134/74   Pulse: 86   Resp: 10 12  Temp:  (!) 36.2 C  SpO2: 96%     Last Pain:  Vitals:   06/14/18 1419  TempSrc: Axillary  PainSc:                  Barnet Glasgow

## 2018-06-14 NOTE — Progress Notes (Signed)
Patient ID: Frederick Hall, male   DOB: 11-16-1940, 78 y.o.   MRN: [id number] EVENING ROUNDS NOTE :     [address]       [place] [address]             [phone]                 Day of Surgery Procedure(s) (LRB): VIDEO ASSISTED THORACOSCOPY (VATS) WITH LUNG RESECTION OF SUPERIOR SEGMENT OF RIGHT LOWER LOBE (Right) Intercostal Nerve Block Lymph Node Dissection  Total Length of Stay:  LOS: 0 days  BP 134/74   Pulse 86   Temp (!) 97.2 F (36.2 C) (Axillary)   Resp 12   Ht 5\' 9"  (1.753 m)   Wt 86.2 kg   SpO2 96%   BMI 28.06 kg/m   .Intake/Output      06/02 0701 - 06/03 0700 06/03 0701 - 06/04 0700   I.V. (mL/kg)  1700 (19.7)   Total Intake(mL/kg)  1700 (19.7)   Urine (mL/kg/hr)  510 (0.5)   Blood  50   Chest Tube  180   Total Output  740   Net  +960          . dextrose 5 % and 0.45% NaCl 100 mL/hr at 06/14/18 1235  . potassium chloride    . vancomycin       Lab Results  Component Value Date   WBC 4.9 06/12/2018   HGB 12.2 (L) 06/12/2018   HCT 37.9 (L) 06/12/2018   PLT 153 06/12/2018   GLUCOSE 107 (H) 06/12/2018   ALT 21 06/12/2018   AST 20 06/12/2018   NA 135 06/12/2018   K 3.7 06/12/2018   CL 104 06/12/2018   CREATININE 0.74 06/12/2018   BUN 14 06/12/2018   CO2 21 (L) 06/12/2018   INR 1.0 06/12/2018   Stable post op No air leak  adequate pain control  respiratory effort ok   [name] MD  Beeper [phone] Office [phone] 06/14/2018 6:04 PM

## 2018-06-14 NOTE — Progress Notes (Signed)
Patient assisted up to bedside recliner per his request, stating he thought he would feel better in the chair.  CT site dressing saturated w/bright red blood; old dressing removed, site cleansed w/NS; new dry dressing of split gauze and 4x4 applied w/medipore tape.  Pt medicated w/PO Oxycodone d/t patient previously having apneic episodes on Fentanyl PCA.  He is more awake now.  Will continue to monitor closely.

## 2018-06-15 ENCOUNTER — Inpatient Hospital Stay (HOSPITAL_COMMUNITY): Payer: Medicare Other

## 2018-06-15 ENCOUNTER — Encounter (HOSPITAL_COMMUNITY): Payer: Self-pay | Admitting: Cardiothoracic Surgery

## 2018-06-15 LAB — BLOOD GAS, ARTERIAL
Acid-Base Excess: 0.5 mmol/L (ref 0.0–2.0)
Bicarbonate: 24.8 mmol/L (ref 20.0–28.0)
Drawn by: 347191
O2 Content: 4 L/min
O2 Saturation: 98.5 %
Patient temperature: 98
pCO2 arterial: 40.9 mmHg (ref 32.0–48.0)
pH, Arterial: 7.398 (ref 7.350–7.450)
pO2, Arterial: 115 mmHg — ABNORMAL HIGH (ref 83.0–108.0)

## 2018-06-15 LAB — BASIC METABOLIC PANEL
Anion gap: 9 (ref 5–15)
BUN: 9 mg/dL (ref 8–23)
CO2: 25 mmol/L (ref 22–32)
Calcium: 8.4 mg/dL — ABNORMAL LOW (ref 8.9–10.3)
Chloride: 98 mmol/L (ref 98–111)
Creatinine, Ser: 0.71 mg/dL (ref 0.61–1.24)
GFR calc Af Amer: >60 mL/min (ref >60)
GFR calc non Af Amer: >60 mL/min (ref >60)
Glucose, Bld: 130 mg/dL — ABNORMAL HIGH (ref 70–99)
Potassium: 3.7 mmol/L (ref 3.5–5.1)
Sodium: 132 mmol/L — ABNORMAL LOW (ref 135–145)

## 2018-06-15 LAB — CBC
HCT: 34.2 % — ABNORMAL LOW (ref 39.0–52.0)
Hemoglobin: 11 g/dL — ABNORMAL LOW (ref 13.0–17.0)
MCH: 31.4 pg (ref 26.0–34.0)
MCHC: 32.2 g/dL (ref 30.0–36.0)
MCV: 97.7 fL (ref 80.0–100.0)
Platelets: 158 10*3/uL (ref 150–400)
RBC: 3.5 MIL/uL — ABNORMAL LOW (ref 4.22–5.81)
RDW: 15.5 % (ref 11.5–15.5)
WBC: 11.4 10*3/uL — ABNORMAL HIGH (ref 4.0–10.5)
nRBC: 0 % (ref 0.0–0.2)

## 2018-06-15 LAB — GLUCOSE, CAPILLARY
Glucose-Capillary: 139 mg/dL — ABNORMAL HIGH (ref 70–99)
Glucose-Capillary: 118 mg/dL — ABNORMAL HIGH (ref 70–99)

## 2018-06-15 MED ORDER — ENSURE ENLIVE PO LIQD: 237.0000 mL | Freq: Two times a day (BID) | ORAL | Status: DC

## 2018-06-15 MED ORDER — FAMOTIDINE 20 MG PO TABS
20.0000 mg | ORAL_TABLET | Freq: Every day | ORAL | Status: DC
  Administered 2018-06-15 – 2018-06-19 (×5): 20 mg via ORAL
  Filled 2018-06-15 (×5): qty 1

## 2018-06-15 MED ORDER — ADULT MULTIVITAMIN W/MINERALS CH
1.0000 | ORAL_TABLET | Freq: Every day | ORAL | Status: DC
  Administered 2018-06-15 – 2018-06-19 (×5): 1 via ORAL
  Filled 2018-06-15 (×5): qty 1

## 2018-06-15 MED ORDER — LUNG SURGERY BOOK
Freq: Once | Status: AC
  Administered 2018-06-15: 10:00:00 1
  Filled 2018-06-15: qty 1

## 2018-06-15 NOTE — Progress Notes (Signed)
Patient ID: Frederick Hall, male   DOB: [birth year], 78 y.o.   MRN: [id number] TCTS DAILY ICU PROGRESS NOTE                   [address]            [place] [address]          [phone]   1 Day Post-Op Procedure(s) (LRB): VIDEO ASSISTED THORACOSCOPY (VATS) WITH LUNG RESECTION OF SUPERIOR SEGMENT OF RIGHT LOWER LOBE (Right) Intercostal Nerve Block Lymph Node Dissection  Total Length of Stay:  LOS: 1 day   Subjective: Awake and alert this am, stable night with adequate pain control   Objective: Vital signs in last 24 hours: Temp:  [97.2 F (36.2 C)-98 F (36.7 C)] 98 F (36.7 C) (06/04 0437) Pulse Rate:  [69-86] 70 (06/04 0437) Cardiac Rhythm: Normal sinus rhythm (06/03 2000) Resp:  [9-20] 19 (06/04 0437) BP: (111-150)/(74-109) 120/82 (06/04 0437) SpO2:  [80 %-100 %] 99 % (06/04 0437) Arterial Line BP: (145-162)/(64-78) 149/64 (06/03 1337)  Filed Weights   06/14/18 0634  Weight: 86.2 kg    Weight change:    Hemodynamic parameters for last 24 hours:    Intake/Output from previous day: 06/03 0701 - 06/04 0700 In: 3481.7 [P.O.:240; I.V.:3241.7] Out: 1910 [Urine:1610; Blood:50; Chest Tube:250]  Intake/Output this shift: No intake/output data recorded.  Current Meds: Scheduled Meds: . acetaminophen  1,000 mg Oral Q6H   Or  . acetaminophen (TYLENOL) oral liquid 160 mg/5 mL  1,000 mg Oral Q6H  . bisacodyl  10 mg Oral Daily  . enoxaparin (LOVENOX) injection  40 mg Subcutaneous Q24H  . famotidine  20 mg Oral QAC breakfast  . fentaNYL   Intravenous Q4H  . insulin aspart  0-24 Units Subcutaneous Q4H  . senna-docusate  1 tablet Oral QHS   Continuous Infusions: . dextrose 5 % and 0.45% NaCl 100 mL/hr at 06/14/18 1235  . potassium chloride     PRN Meds:.alum & mag hydroxide-simeth, diphenhydrAMINE **OR** diphenhydrAMINE, naloxone **AND** sodium chloride flush, ondansetron (ZOFRAN) IV, ondansetron (ZOFRAN) IV, oxyCODONE, potassium chloride,  traMADol  General appearance: alert, cooperative and no distress Neurologic: intact Heart: regular rate and rhythm, S1, S2 normal, no murmur, click, rub or gallop Lungs: diminished breath sounds bibasilar Abdomen: soft, non-tender; bowel sounds normal; no masses,  no organomegaly Extremities: extremities normal, atraumatic, no cyanosis or edema and Homans sign is negative, no sign of DVT Wound: no air leak   Lab Results: CBC: Recent Labs    06/12/18 0919  WBC 4.9  HGB 12.2*  HCT 37.9*  PLT 153   BMET:  Recent Labs    06/12/18 0919  NA 135  K 3.7  CL 104  CO2 21*  GLUCOSE 107*  BUN 14  CREATININE 0.74  CALCIUM 9.0    CMET: Lab Results  Component Value Date   WBC 4.9 06/12/2018   HGB 12.2 (L) 06/12/2018   HCT 37.9 (L) 06/12/2018   PLT 153 06/12/2018   GLUCOSE 107 (H) 06/12/2018   ALT 21 06/12/2018   AST 20 06/12/2018   NA 135 06/12/2018   K 3.7 06/12/2018   CL 104 06/12/2018   CREATININE 0.74 06/12/2018   BUN 14 06/12/2018   CO2 21 (L) 06/12/2018   INR 1.0 06/12/2018      PT/INR:  Recent Labs    06/12/18 0919  LABPROT 12.6  INR 1.0   Radiology: Dg Chest [facility] 1 View  Result  Date: 06/14/2018 CLINICAL DATA:  Follow-up right chest tube EXAM: PORTABLE CHEST 1 VIEW COMPARISON:  06/12/2018 FINDINGS: Cardiac shadow is accentuated by the frontal technique. 2 chest tubes are noted on the right. No definitive pneumothorax is seen. Mild right basilar atelectasis is noted related to poor inspiratory effort. No bony abnormality is noted. IMPRESSION: Chest tubes in place on the right without definitive pneumothorax. Electronically Signed   By: Inez Catalina M.D.   On: 06/14/2018 13:59     Assessment/Plan: S/P Procedure(s) (LRB): VIDEO ASSISTED THORACOSCOPY (VATS) WITH LUNG RESECTION OF SUPERIOR SEGMENT OF RIGHT LOWER LOBE (Right) Intercostal Nerve Block Lymph Node Dissection Mobilize Diuresis See progression orders Expected Acute  Blood - loss Anemia- continue  to monitor  CT to water seal D/c a line and foley  Resume pepsid as was taking preop  Frederick Hall 06/15/2018 7:09 AM

## 2018-06-15 NOTE — Discharge Summary (Signed)
[address]       [place] [address]             [phone]      Physician Discharge Summary  Patient ID: Arturo Sofranko MRN: [id number] DOB/AGE: 18-Mar-1940 78 y.o.  Admit date: 06/14/2018 Discharge date: 06/19/2018  Admission Diagnoses:  Patient Active Problem List   Diagnosis Date Noted  . Lung cancer, right lower lobe ([place]) 06/14/2018  . Malignant neoplasm of prostate ([place]) 07/14/2017  . Osteoarthritis of left hip 08/07/2012  . Osteoarthritis of right hip 05/23/2012    Discharge Diagnoses:  Active Problems:   Lung cancer, right lower lobe [facility])   Discharged Condition: good  HPI:   Rocio Wolak 78 y.o. male  Has been followed.  He has been evaluated for  groundglass opacity in the right lower lung in the past. The patient is a distant smoker having smoked for 10 to 12 years but he stopped smoking in 1976. The patient was recently diagnosed with prostate cancer in late summer 2019 by Dr. Phillis Knack of the abdomen was performed indicating a groundglass opacity 1.4 to 1.5 cm in size in the right lower lobe,to follow-up on this a CT scan of the chest was performed.The patient was referred by Dr. Diona Fanti.Patient denies any respiratory symptoms,denies hemoptysis.  He has no significant occupational respiratory exposures that he is aware of.  in late February 2020 I recommended to him that we proceed with CT directed needle biopsy of the lesion he wanted to delay this for several weeks.  A CT directed needle biopsy biopsy was done , the final report the patient confirmed   Diagnosis Lung, needle/core biopsy(ies), Right lower lobe - ADENOCARCINOMA WITH A LEPIDIC PATTERN. SEE NOTE Diagnosis Note The lesion shows a predominantly lepidic (in situ) pattern. Definite evidence of invasion is not seen in the submitted biopsies but cannot be ruled out. Dr. Melina Copa has reviewed this case and concurs with the above diagnosis.         Dr. Servando Snare was paged on 04/18/2018 Jaquita Folds MD   The patient is a distant smoker having smoked for 10 to 12 years but he stopped smoking in 1976.  Hospital Course:    This patient is a 78 year old male who underwent a right-sided video-assisted thoracoscopy with lung resection of the superior segment of the right lower lobe with Dr. Servando Snare.  He tolerated the procedure well and was transferred to the PACU in stable condition.  He was extubated in a timely manner and was transferred to the stepdown unit for continued care.  There was no air leak in his chest tube postoperatively and his pain was adequately controlled.  Postop day 1 we began to mobilize the patient.  We initiated a diuretic regimen for fluid overload.  He did have some expected acute blood loss anemia which we trended.  We change his chest tube to waterseal.  We discontinued his arterial line and Foley catheter.  We decreased his IV fluids.  We resumed his Pepcid that he was taking preoperatively due to acid reflux. Addendum: Chest tube was removed on 06/06. Follow up chest x ray showed Small right-sided pneumothorax appears stable without tensioncomponent. There is persistent subcutaneous air on the right, primarily inferolateral in location. Bibasilar atelectasis with rather minimal right pleural effusion. Lungs elsewhere clear. Stable cardiac silhouette. He has been ambulating on room air. He has been tolerating a diet and has had a bowel movement. Wounds are clean,  dry, and no sign of infection. He is felt surgically stable for discharge today.   Consults: None  Significant Diagnostic Studies:   CLINICAL DATA:  Check chest tube  EXAM: PORTABLE CHEST 1 VIEW  COMPARISON:  06/14/2018  FINDINGS: Cardiac shadows within normal limits. Two right-sided chest tubes are noted. Minimal right apical pneumothorax is noted. Mild bibasilar atelectasis is noted. No other focal abnormality is seen.  IMPRESSION: Minimal  right pneumothorax with mild bibasilar atelectasis.   Electronically Signed   By: Inez Catalina M.D.   On: 06/15/2018 08:24  Treatments:   NAME: AEDON, DEASON [facility] MEDICAL RECORD [id number] ACCOUNT [id number] DATE OF BIRTH:23-Jan-1940 [facility] LOCATION: [facility] PHYSICIAN:EDWARD Maryruth Bun, MD  OPERATIVE REPORT  DATE OF PROCEDURE:  06/14/2018  PREOPERATIVE DIAGNOSIS:  Adenocarcinoma, superior segment right lower lobe.  POSTOPERATIVE DIAGNOSIS:  Adenocarcinoma, superior segment right lower lobe.  SURGICAL PROCEDURE:  Right video-assisted thoracoscopy with right lower lobe segmentectomy and lymph node dissection and intercostal nerve block.  SURGEON:  Lanelle Bal, MD  FIRST ASSISTANT:  Nicholes Rough, PA  BRIEF HISTORY:  The patient is a 78 year old male who was originally diagnosed with prostate cancer.  On evaluation of this, a right lower lobe lung ground-glass opacity was noted in the summer of 2019.  He was ultimately referred to thoracic surgery for  further evaluation including repeat CT scan and needle biopsy-confirmed adenocarcinoma.  Discussion of treatment options with the patient including surgical resection versus stereotactic radiotherapy was done.  Ultimately, he wished to proceed with  surgical resection, originally presenting during the onset of the COVID pandemic.  His surgery was delayed for a month as the lesion appeared mostly ground glass and had not changed over 6 months.  Risks and options were discussed with the patient in  detail.  He was agreeable and signed informed consent.   Discharge Exam: Blood pressure (!) 147/95, pulse 76, temperature 98.9 F (37.2 C), temperature source Axillary, resp. rate 15, height 5\' 9"  (1.753 m), weight 86.2 kg, SpO2 97 %.   General appearance: alert, cooperative and no distress Heart: regular rate and rhythm, S1, S2 normal, no murmur, click, rub or gallop Lungs: clear to auscultation bilaterally  Abdomen: soft, non-tender; bowel sounds normal; no masses,  no organomegaly Extremities: extremities normal, atraumatic, no cyanosis or edema Wound: clean and dry  Disposition: Discharge disposition: 01-Home or Self Care        Allergies as of 06/19/2018      Reactions   Penicillins Anaphylaxis, Itching, Rash   Childhood allergy. Tolerated cefazolin 05/23/12. Did it involve swelling of the face/tongue/throat, SOB, or low BP? Yes Did it involve sudden or severe rash/hives, skin peeling, or any reaction on the inside of your mouth or nose? No Did you need to seek medical attention at a hospital or doctor's office? Yes When did it last happen?childhood allergy If all above answers are "NO", may proceed with cephalosporin use.   Cefuroxime Axetil Diarrhea   Other    general anesthesia - severe constipation    Pollen Extract Itching   Seasonal allergies.      Medication List    TAKE these medications   acetaminophen 500 MG tablet Commonly known as:  TYLENOL Take 500-1,000 mg by mouth every 8 (eight) hours as needed for moderate pain or headache.   CALCIUM-MAGNESIUM-VITAMIN D PO Take 2 tablets by mouth daily.   famotidine 20 MG tablet Commonly known as:  PEPCID Take 20 mg by mouth daily before breakfast.   hydrochlorothiazide  12.5 MG capsule Commonly known as:  MICROZIDE Take 12.5 mg by mouth every other day.   leuprolide 30 MG injection Commonly known as:  LUPRON Inject 30 mg into the muscle every 4 (four) months. Last dose 05/26/18   lisinopril 20 MG tablet Commonly known as:  ZESTRIL Take 20 mg by mouth every morning.   multivitamin with minerals Tabs tablet Take 1 tablet by mouth daily.   naproxen sodium 220 MG tablet Commonly known as:  ALEVE Take 220 mg by mouth 2 (two) times daily as needed (heavy activity (golf, exercise)).   oxyCODONE 5 MG immediate release tablet Commonly known as:  Oxy IR/ROXICODONE Take 1-2 tablets (5-10 mg total) by mouth every  4 (four) hours as needed for severe pain.   phenylephrine 1 % nasal spray Commonly known as:  NEO-SYNEPHRINE Place 1 drop into both nostrils every 6 (six) hours as needed for congestion.   senna 8.6 MG Tabs tablet Commonly known as:  SENOKOT Take 1 tablet by mouth daily.   tadalafil 20 MG tablet Commonly known as:  CIALIS Take 20 mg by mouth daily as needed for erectile dysfunction.   Uro-MP 118 MG Caps Take 1 capsule by mouth 2 (two) times a day.   vitamin C 500 MG tablet Commonly known as:  ASCORBIC ACID Take 500 mg by mouth daily.   ZINC PO Take 15 mg by mouth daily.      Follow-up Information    [name], MD Follow up.   Specialty:  Cardiothoracic Surgery Why:  Your routine follow-up appointment is on 07/13/2018 at 9:00am. Please arrive at 8:30am for a chest xray located at [facility] which is on the first floor of our building. Contact information: [address] [place] [address] [phone]        [name], MD. Call in 1 day(s).   Specialty:  Family Medicine Contact information: [address] [place] [address] [phone]        nursing suture removal appointment Follow up.   Why:  Your suture removal appointment is on 06/30/2018 at 11:00am.  Contact information: Dr. Everrett Coombe office          Signed: Elgie Collard PA-C 06/19/2018, 1:57 PM

## 2018-06-15 NOTE — Op Note (Signed)
NAME: Frederick Hall, Frederick Hall MEDICAL RECORD [id number] ACCOUNT [id number] DATE OF BIRTH:06-04-1940 FACILITY[facility] LOCATION: [facility] PHYSICIAN:Ellwood Steidle Maryruth Bun, MD  OPERATIVE REPORT  DATE OF PROCEDURE:  06/14/2018  PREOPERATIVE DIAGNOSIS:  Adenocarcinoma, superior segment right lower lobe.  POSTOPERATIVE DIAGNOSIS:  Adenocarcinoma, superior segment right lower lobe.  SURGICAL PROCEDURE:  Right video-assisted thoracoscopy with right lower lobe segmentectomy and lymph node dissection and intercostal nerve block.  SURGEON:  Lanelle Bal, MD  FIRST ASSISTANT:  Nicholes Rough, PA  BRIEF HISTORY:  The patient is a 78 year old male who was originally diagnosed with prostate cancer.  On evaluation of this, a right lower lobe lung ground-glass opacity was noted in the summer of 2019.  He was ultimately referred to thoracic surgery for  further evaluation including repeat CT scan and needle biopsy-confirmed adenocarcinoma.  Discussion of treatment options with the patient including surgical resection versus stereotactic radiotherapy was done.  Ultimately, he wished to proceed with  surgical resection, originally presenting during the onset of the COVID pandemic.  His surgery was delayed for a month as the lesion appeared mostly ground glass and had not changed over 6 months.  Risks and options were discussed with the patient in  detail.  He was agreeable and signed informed consent.  DESCRIPTION OF PROCEDURE:  The patient underwent general endotracheal anesthesia.  This was originally planned with a double-lumen endotracheal tube.  Please see anesthesia notes.  There was difficulty in getting him intubated.  Ultimately, a  single-lumen blocker tube was used because of inability to place a double-lumen endotracheal tube.  The patient was then turned in a lateral decubitus position with the right side up.  The right side had preoperatively been marked.  The right chest was  prepped with Betadine  and draped in the usual sterile manner.  Appropriate timeout was performed.  We then proceeded with a small port incision, approximately the 6th intercostal space along the posterior axillary line.  We entered the chest under direct  vision with a 30-degree 5 mm scope.  Unfortunately with a blocker tube, there was difficulty in fully isolating the lung, and with some more manipulation by anesthesia, ultimately we were able to have the right lung not ventilated.  The incision was  enlarged slightly, and working through the incision and through a lower port site, we were able to free up the lower lobe and locate the area in question, which was mostly involving the superior segment of the right lower lobe.  This area was isolated.   The major and minor fissures were well developed.  We then used serial firing of gold load Ethicon-powered staplers that functionally removed the superior segment of the lower lobe.  This was brought out through the incision and submitted to pathology,  both to confirm the presence of the known lesion and free margins.  Frozen section did confirm adenocarcinoma with ____ pattern and negative margins.  We then proceeded with lymph node sampling including 10R, 11R and 12R nodes.  The other nodes were not  significantly enlarged nor numerous.  The inferior pulmonary ligament was divided in small fragments of #8 node and also 7.   These were submitted to pathology.  Using a solution of bupivacaine and ____, we injected 5-8 mL along each intercostal nerve  above and below the incision and port sites, too.  Chest tubes were placed, 1 standard Argyle anteriorly and 1 Blake drain 28 French posteriorly.  The tubes were secured in place.  The incision was closed with  pericostal sutures placed through small  drill holes in the lower rib.  The remaining layers were closed with interrupted 0 Vicryl, running 2-0 Vicryl in subcutaneous tissue and 3-0 subcuticular stitch in skin edges.  Dermabond  was placed on the incision.  The lung inflated nicely at the  completion of the procedure.  There was no air leak at the completion of the procedure.  The sponge and needle count was reported as correct.  RF scanning showed clear code.  Estimated blood loss was 50 mL.  The patient was awakened and extubated in the  operating room and then transferred to the recovery room in good condition.  LN/NUANCE  D:06/14/2018 T:06/15/2018 JOB:[id number]

## 2018-06-15 NOTE — Plan of Care (Signed)
  Problem: Education: Goal: Knowledge of General Education information will improve Description Including pain rating scale, medication(s)/side effects and non-pharmacologic comfort measures Outcome: Progressing   Problem: Clinical Measurements: Goal: Ability to maintain clinical measurements within normal limits will improve Outcome: Progressing Goal: Will remain free from infection Outcome: Progressing Goal: Diagnostic test results will improve Outcome: Progressing Goal: Respiratory complications will improve Outcome: Progressing Goal: Cardiovascular complication will be avoided Outcome: Progressing   Problem: Education: Goal: Knowledge of the prescribed therapeutic regimen will improve Outcome: Progressing   Problem: Activity: Goal: Risk for activity intolerance will decrease Outcome: Progressing   Problem: Cardiac: Goal: Will achieve and/or maintain hemodynamic stability Outcome: Progressing   Problem: Clinical Measurements: Goal: Postoperative complications will be avoided or minimized Outcome: Progressing   Problem: Respiratory: Goal: Respiratory status will improve Outcome: Progressing   Problem: Pain Management: Goal: Pain level will decrease Outcome: Progressing   Problem: Skin Integrity: Goal: Wound healing without signs and symptoms infection will improve Outcome: Progressing

## 2018-06-15 NOTE — Discharge Instructions (Signed)

## 2018-06-15 NOTE — Progress Notes (Signed)
Initial Nutrition Assessment  DOCUMENTATION CODES:   Not applicable  INTERVENTION:   - d/c Ensure Enlive as pt does not want to drink oral nutrition supplements at this time  - MVI with minerals daily  - Encourage adequate PO intake  NUTRITION DIAGNOSIS:   Increased nutrient needs related to post-op healing as evidenced by estimated needs.  GOAL:   Patient will meet greater than or equal to 90% of their needs  MONITOR:   PO intake, Labs, Skin, I & O's, Weight trends  REASON FOR ASSESSMENT:   Malnutrition Screening Tool    ASSESSMENT:   78 year old Hall who presented on 6/03 for VATS with lung resection of superior segment of right lower lobe. PMH of prostate cancer (diagnosed in summer 2019). CT of the abdomen showing a groundglass opacity 1.4 to 1.5 cm in size in the right lower lobe.  Spoke with pt at bedside. Pt in good spirits, states pain is well-controlled.  Pt reports that he and his wife eat a balanced diet at home. Pt states that they eat "a lot of greens, a lot of healthy foods." Pt states he has noticed that his appetite has decreased recently but later states his appetite is "no problem."  Pt reports that right now, he has swelling from his procedure which is pushing on his stomach and making him feel fuller than usual. Pt reports that he ate well at lunch and states he consumed 100% of chicken salad sandwich, jello, and pudding. No meal completions recorded in pt's chart.  Pt declines any oral nutrition supplements at this time. Pt states he does take a MVI at home in addition to vitamin C, zinc, calcium, and an herbal supplement for laxative purposes. RD to order MVI with minerals during admission.  Pt states that when he started treatment for his prostate cancer, he gained 12 lbs in his abdomen in the span of 2-3 weeks. Pt states that this was an expected side effect of the medications. Pt suspects he will lose the weight once he gets back on testosterone. Pt  denies any unintentional weight loss.  Reviewed weight history in chart. Pt's weight has been stable over the last 1 year with a slight upward trend.  Pt denies any nutrition-related issues.  Medications reviewed and include: Dulcolax, Pepcid, Ensure Enlive BID, Senna IVF: D5 in 1/2NS @ 10 ml/hr  Labs reviewed: sodium 132 (L) CBG's: 118, 139, 147, 184 x 24 hours  UOP: 1610 ml x 24 hours CT: 250 ml x 24 hours  NUTRITION - FOCUSED PHYSICAL EXAM:    Most Recent Value  Orbital Region  No depletion  Upper Arm Region  No depletion  Thoracic and Lumbar Region  No depletion  Buccal Region  No depletion  Temple Region  No depletion  Clavicle Bone Region  No depletion  Clavicle and Acromion Bone Region  No depletion  Scapular Bone Region  No depletion  Dorsal Hand  No depletion  Patellar Region  No depletion  Anterior Thigh Region  No depletion  Posterior Calf Region  No depletion  Edema (RD Assessment)  None  Hair  Reviewed  Eyes  Reviewed  Mouth  Reviewed  Skin  Reviewed  Nails  Reviewed       Diet Order:   Diet Order            DIET SOFT Room service appropriate? Yes; Fluid consistency: Thin  Diet effective now  EDUCATION NEEDS:   Education needs have been addressed  Skin:  Skin Assessment: Skin Integrity Issues: Incisions: closed incision to right chest  Last BM:  06/13/18  Height:   Ht Readings from Last 1 Encounters:  06/14/18 5\' 9"  (1.753 m)    Weight:   Wt Readings from Last 1 Encounters:  06/14/18 86.2 kg    Ideal Body Weight:  72.7 kg  BMI:  Body mass index is 28.06 kg/m.  Estimated Nutritional Needs:   Kcal:  2000-2200  Protein:  100-115 grams  Fluid:  >/= 1.8 L    Frederick Face, MS, RD, LDN Inpatient Clinical Dietitian Pager: [phone] Weekend/After Hours: [phone]

## 2018-06-16 ENCOUNTER — Inpatient Hospital Stay (HOSPITAL_COMMUNITY): Payer: Medicare Other

## 2018-06-16 LAB — CBC
HCT: 33.5 % — ABNORMAL LOW (ref 39.0–52.0)
Hemoglobin: 10.7 g/dL — ABNORMAL LOW (ref 13.0–17.0)
MCH: 31.3 pg (ref 26.0–34.0)
MCHC: 31.9 g/dL (ref 30.0–36.0)
MCV: 98 fL (ref 80.0–100.0)
Platelets: 139 10*3/uL — ABNORMAL LOW (ref 150–400)
RBC: 3.42 MIL/uL — ABNORMAL LOW (ref 4.22–5.81)
RDW: 15.6 % — ABNORMAL HIGH (ref 11.5–15.5)
WBC: 8.3 10*3/uL (ref 4.0–10.5)
nRBC: 0 % (ref 0.0–0.2)

## 2018-06-16 LAB — COMPREHENSIVE METABOLIC PANEL
ALT: 22 U/L (ref 0–44)
AST: 20 U/L (ref 15–41)
Albumin: 2.6 g/dL — ABNORMAL LOW (ref 3.5–5.0)
Alkaline Phosphatase: 49 U/L (ref 38–126)
Anion gap: 7 (ref 5–15)
BUN: 8 mg/dL (ref 8–23)
CO2: 26 mmol/L (ref 22–32)
Calcium: 8.4 mg/dL — ABNORMAL LOW (ref 8.9–10.3)
Chloride: 98 mmol/L (ref 98–111)
Creatinine, Ser: 0.74 mg/dL (ref 0.61–1.24)
GFR calc Af Amer: >60 mL/min (ref >60)
GFR calc non Af Amer: >60 mL/min (ref >60)
Glucose, Bld: 124 mg/dL — ABNORMAL HIGH (ref 70–99)
Potassium: 3.9 mmol/L (ref 3.5–5.1)
Sodium: 131 mmol/L — ABNORMAL LOW (ref 135–145)
Total Bilirubin: 0.9 mg/dL (ref 0.3–1.2)
Total Protein: 5 g/dL — ABNORMAL LOW (ref 6.5–8.1)

## 2018-06-16 NOTE — Plan of Care (Signed)
  Problem: Education: Goal: Knowledge of General Education information will improve Description Including pain rating scale, medication(s)/side effects and non-pharmacologic comfort measures Outcome: Progressing   Problem: Clinical Measurements: Goal: Ability to maintain clinical measurements within normal limits will improve Outcome: Progressing Goal: Will remain free from infection Outcome: Progressing Goal: Diagnostic test results will improve Outcome: Progressing Goal: Respiratory complications will improve Outcome: Progressing Goal: Cardiovascular complication will be avoided Outcome: Progressing   Problem: Education: Goal: Knowledge of the prescribed therapeutic regimen will improve Outcome: Progressing   Problem: Activity: Goal: Risk for activity intolerance will decrease Outcome: Progressing   Problem: Cardiac: Goal: Will achieve and/or maintain hemodynamic stability Outcome: Progressing   Problem: Clinical Measurements: Goal: Postoperative complications will be avoided or minimized Outcome: Progressing   Problem: Respiratory: Goal: Respiratory status will improve Outcome: Progressing   Problem: Pain Management: Goal: Pain level will decrease Outcome: Progressing   Problem: Skin Integrity: Goal: Wound healing without signs and symptoms infection will improve Outcome: Progressing

## 2018-06-16 NOTE — Progress Notes (Signed)
      [address]       [place] [address]             [phone]     Diagnosis 1. Lung, wedge biopsy/resection, superior segment right lower lobe - INVASIVE ADENOCARCINOMA, LIPIDIC PREDOMINANT (0.7 CM) - MARGINS UNINVOLVED BY CARCINOMA - SEE ONCOLOGY TABLE AND COMMENT BELOW 2. Lymph node, biopsy, lymph node 11R - NO CARCINOMA IDENTIFIED IN LYMPHOID TISSUE 3. Lymph node, biopsy, Lymph Node 8 - BENIGN FIBROADIPOSE TISSUE - NO LYMPHOID TISSUE IDENTIFIED 4. Lymph node, biopsy, Lymph Node Level 10 - NO CARCINOMA IDENTIFIED IN LYMPHOID TISSUE 5. Lymph node, biopsy, Lymph Node Level 12 - NO CARCINOMA IDENTIFIED IN LYMPHOID TISSUE   Cancer Staging Lung cancer, right lower lobe (HCC) Staging form: Lung, AJCC 8th Edition - Pathologic stage from 06/16/2018: Stage IA1 (pT1a, pN0, cM0) - Signed by [name], MD on 06/16/2018

## 2018-06-16 NOTE — Care Management Important Message (Signed)
Important Message  Patient Details  Name: Frederick Hall MRN: [id number] Date of Birth: 01-13-1940   Medicare Important Message Given:  Yes    Memory Argue 06/16/2018, 2:38 PM

## 2018-06-16 NOTE — Progress Notes (Addendum)
      [address]       [place] [address]             [phone]      2 Days Post-Op Procedure(s) (LRB): VIDEO ASSISTED THORACOSCOPY (VATS) WITH LUNG RESECTION OF SUPERIOR SEGMENT OF RIGHT LOWER LOBE (Right) Intercostal Nerve Block Lymph Node Dissection Subjective: No issues overnight. He is using his incentive spirometer  Objective: Vital signs in last 24 hours: Temp:  [97.6 F (36.4 C)-98.1 F (36.7 C)] 98.1 F (36.7 C) (06/05 0259) Pulse Rate:  [69-89] 73 (06/05 0623) Cardiac Rhythm: Normal sinus rhythm (06/05 0700) Resp:  [12-31] 18 (06/05 0623) BP: (113-147)/(68-95) 114/82 (06/05 0259) SpO2:  [90 %-98 %] 96 % (06/05 0623)     Intake/Output from previous day: 06/04 0701 - 06/05 0700 In: 9753 [P.O.:840; I.V.:593] Out: 2845 [Urine:2325; Chest Tube:520] Intake/Output this shift: No intake/output data recorded.  General appearance: alert, cooperative and no distress Heart: regular rate and rhythm, S1, S2 normal, no murmur, click, rub or gallop Lungs: clear to auscultation bilaterally Abdomen: soft, non-tender; bowel sounds normal; no masses,  no organomegaly Extremities: extremities normal, atraumatic, no cyanosis or edema Wound: clean and dry  Lab Results: Recent Labs    06/15/18 0611 06/16/18 0209  WBC 11.4* 8.3  HGB 11.0* 10.7*  HCT 34.2* 33.5*  PLT 158 139*   BMET:  Recent Labs    06/15/18 0611 06/16/18 0209  NA 132* 131*  K 3.7 3.9  CL 98 98  CO2 25 26  GLUCOSE 130* 124*  BUN 9 8  CREATININE 0.71 0.74  CALCIUM 8.4* 8.4*    PT/INR: No results for input(s): LABPROT, INR in the last 72 hours. ABG    Component Value Date/Time   PHART 7.398 06/15/2018 0457   HCO3 24.8 06/15/2018 0457   ACIDBASEDEF 0.4 06/12/2018 0919   O2SAT 98.5 06/15/2018 0457   CBG (last 3)  Recent Labs    06/14/18 2329 06/15/18 0439 06/15/18 0809  GLUCAP 147* 139* 118*    Assessment/Plan: S/P Procedure(s) (LRB): VIDEO ASSISTED THORACOSCOPY  (VATS) WITH LUNG RESECTION OF SUPERIOR SEGMENT OF RIGHT LOWER LOBE (Right) Intercostal Nerve Block Lymph Node Dissection  1. CV-NSR in the 60s. BP well controlled.  2. Pulm-chest tube put out 520cc/24 hours-keep 3. Renal-creatinine 0.74, electrolytes okay 4. H and H 10.7/33.5 5. Endo-blood glucose well controlled  Plan: Remove anterior chest tube. Continue to encourage incentive spirometer. Ambulate in the halls. CT is on water seal.      LOS: 2 days    Elgie Collard 06/16/2018  No air leak, Anterior chest to be removed  Poss remaining tube tomorrow Path still pending I have seen and examined Frederick Hall and agree with the above assessment  and plan.  [name] MD Beeper [phone] Office [phone] 06/16/2018 1:19 PM

## 2018-06-17 ENCOUNTER — Inpatient Hospital Stay (HOSPITAL_COMMUNITY): Payer: Medicare Other

## 2018-06-17 NOTE — Progress Notes (Signed)
3 Days Post-Op Procedure(s) (LRB): VIDEO ASSISTED THORACOSCOPY (VATS) WITH LUNG RESECTION OF SUPERIOR SEGMENT OF RIGHT LOWER LOBE (Right) Intercostal Nerve Block Lymph Node Dissection Subjective: No complaints  Objective: Vital signs in last 24 hours: Temp:  [97.4 F (36.3 C)-97.9 F (36.6 C)] 97.8 F (36.6 C) (06/06 0743) Pulse Rate:  [65-84] 66 (06/06 0400) Cardiac Rhythm: Normal sinus rhythm (06/06 0700) Resp:  [12-23] 12 (06/06 0400) BP: (117-134)/(72-90) 117/74 (06/06 0332) SpO2:  [94 %-98 %] 94 % (06/06 0400)  Hemodynamic parameters for last 24 hours:    Intake/Output from previous day: 06/05 0701 - 06/06 0700 In: 685 [P.O.:605; I.V.:80] Out: 2215 [Urine:2075; Chest Tube:140] Intake/Output this shift: Total I/O In: 240 [P.O.:240] Out: 50 [Chest Tube:50]  General appearance: alert, cooperative and no distress Neurologic: intact Heart: regular rate and rhythm Lungs: rubs right base no air leak  Lab Results: Recent Labs    06/15/18 0611 06/16/18 0209  WBC 11.4* 8.3  HGB 11.0* 10.7*  HCT 34.2* 33.5*  PLT 158 139*   BMET:  Recent Labs    06/15/18 0611 06/16/18 0209  NA 132* 131*  K 3.7 3.9  CL 98 98  CO2 25 26  GLUCOSE 130* 124*  BUN 9 8  CREATININE 0.71 0.74  CALCIUM 8.4* 8.4*    PT/INR: No results for input(s): LABPROT, INR in the last 72 hours. ABG    Component Value Date/Time   PHART 7.398 06/15/2018 0457   HCO3 24.8 06/15/2018 0457   ACIDBASEDEF 0.4 06/12/2018 0919   O2SAT 98.5 06/15/2018 0457   CBG (last 3)  Recent Labs    06/14/18 2329 06/15/18 0439 06/15/18 0809  GLUCAP 147* 139* 118*    Assessment/Plan: S/P Procedure(s) (LRB): VIDEO ASSISTED THORACOSCOPY (VATS) WITH LUNG RESECTION OF SUPERIOR SEGMENT OF RIGHT LOWER LOBE (Right) Intercostal Nerve Block Lymph Node Dissection -Doing well  No air leak and minimal drainage from tube- will dc chest tube Possibly home in 24-48 hours Continue ambulation   LOS: 3 days     Melrose Nakayama 06/17/2018

## 2018-06-18 ENCOUNTER — Inpatient Hospital Stay (HOSPITAL_COMMUNITY): Payer: Medicare Other

## 2018-06-18 MED ORDER — HYDROCHLOROTHIAZIDE 12.5 MG PO CAPS
12.5000 mg | ORAL_CAPSULE | ORAL | Status: DC
  Administered 2018-06-18 (×2): 12.5 mg via ORAL
  Filled 2018-06-18 (×2): qty 1

## 2018-06-18 MED ORDER — LISINOPRIL 20 MG PO TABS
20.0000 mg | ORAL_TABLET | Freq: Every day | ORAL | Status: DC
  Administered 2018-06-18 – 2018-06-19 (×3): 20 mg via ORAL
  Filled 2018-06-18 (×3): qty 1

## 2018-06-18 NOTE — Progress Notes (Signed)
4 Days Post-Op Procedure(s) (LRB): VIDEO ASSISTED THORACOSCOPY (VATS) WITH LUNG RESECTION OF SUPERIOR SEGMENT OF RIGHT LOWER LOBE (Right) Intercostal Nerve Block Lymph Node Dissection Subjective: C/o generally feeling poorly when he first woke up this AM- "like a hangover" Feels better now Concerned about BP  Objective: Vital signs in last 24 hours: Temp:  [97.6 F (36.4 C)-98.1 F (36.7 C)] 97.6 F (36.4 C) (06/07 0748) Pulse Rate:  [73-88] 76 (06/07 0748) Cardiac Rhythm: Normal sinus rhythm (06/07 0700) Resp:  [11-21] 20 (06/07 0748) BP: (142-166)/(92-102) 156/102 (06/07 0748) SpO2:  [92 %-97 %] 97 % (06/07 0748)  Hemodynamic parameters for last 24 hours:    Intake/Output from previous day: 06/06 0701 - 06/07 0700 In: 1560 [P.O.:1560] Out: 1925 [Urine:1875; Chest Tube:50] Intake/Output this shift: Total I/O In: 480 [P.O.:480] Out: -   General appearance: alert, cooperative and no distress Neurologic: intact Heart: regular rate and rhythm Lungs: diminished breath sounds right base Wound: clean and dry  Lab Results: Recent Labs    06/16/18 0209  WBC 8.3  HGB 10.7*  HCT 33.5*  PLT 139*   BMET:  Recent Labs    06/16/18 0209  NA 131*  K 3.9  CL 98  CO2 26  GLUCOSE 124*  BUN 8  CREATININE 0.74  CALCIUM 8.4*    PT/INR: No results for input(s): LABPROT, INR in the last 72 hours. ABG    Component Value Date/Time   PHART 7.398 06/15/2018 0457   HCO3 24.8 06/15/2018 0457   ACIDBASEDEF 0.4 06/12/2018 0919   O2SAT 98.5 06/15/2018 0457   CBG (last 3)  No results for input(s): GLUCAP in the last 72 hours.  Assessment/Plan: S/P Procedure(s) (LRB): VIDEO ASSISTED THORACOSCOPY (VATS) WITH LUNG RESECTION OF SUPERIOR SEGMENT OF RIGHT LOWER LOBE (Right) Intercostal Nerve Block Lymph Node Dissection -Looks good currently CT out- CXR ok  Pain reasonably well controlled Hypertension- restart lisinopril and HCTZ Ambulate Probably home in AM   LOS: 4 days     Melrose Nakayama 06/18/2018

## 2018-06-19 ENCOUNTER — Inpatient Hospital Stay (HOSPITAL_COMMUNITY): Payer: Medicare Other

## 2018-06-19 MED ORDER — OXYCODONE HCL 5 MG PO TABS: 5.0000 mg | ORAL_TABLET | ORAL | 0 refills | Status: DC | PRN

## 2018-06-19 NOTE — Progress Notes (Signed)
      [address]       [place], [address]             [phone]      5 Days Post-Op Procedure(s) (LRB): VIDEO ASSISTED THORACOSCOPY (VATS) WITH LUNG RESECTION OF SUPERIOR SEGMENT OF RIGHT LOWER LOBE (Right) Intercostal Nerve Block Lymph Node Dissection Subjective: Feels okay this morning.   Objective: Vital signs in last 24 hours: Temp:  [97.6 F (36.4 C)-98.9 F (37.2 C)] 98.9 F (37.2 C) (06/08 0739) Pulse Rate:  [69-76] 76 (06/08 0739) Cardiac Rhythm: Normal sinus rhythm (06/08 0400) Resp:  [15-20] 15 (06/08 0739) BP: (146-164)/(95-102) 147/95 (06/08 0739) SpO2:  [95 %-99 %] 97 % (06/08 0739)     Intake/Output from previous day: 06/07 0701 - 06/08 0700 In: 1417 [P.O.:1417] Out: 2200 [Urine:2200] Intake/Output this shift: Total I/O In: 240 [P.O.:240] Out: -   General appearance: alert, cooperative and no distress Heart: regular rate and rhythm, S1, S2 normal, no murmur, click, rub or gallop Lungs: clear to auscultation bilaterally Abdomen: soft, non-tender; bowel sounds normal; no masses,  no organomegaly Extremities: extremities normal, atraumatic, no cyanosis or edema Wound: clean and dry  Lab Results: No results for input(s): WBC, HGB, HCT, PLT in the last 72 hours. BMET: No results for input(s): NA, K, CL, CO2, GLUCOSE, BUN, CREATININE, CALCIUM in the last 72 hours.  PT/INR: No results for input(s): LABPROT, INR in the last 72 hours. ABG    Component Value Date/Time   PHART 7.398 06/15/2018 0457   HCO3 24.8 06/15/2018 0457   ACIDBASEDEF 0.4 06/12/2018 0919   O2SAT 98.5 06/15/2018 0457   CBG (last 3)  No results for input(s): GLUCAP in the last 72 hours.  Assessment/Plan: S/P Procedure(s) (LRB): VIDEO ASSISTED THORACOSCOPY (VATS) WITH LUNG RESECTION OF SUPERIOR SEGMENT OF RIGHT LOWER LOBE (Right) Intercostal Nerve Block Lymph Node Dissection  1. CV- NSR in the 70s, BP improved now that he is back on his home medications.  Running SBP 140s.  2. Pulm-tolerating room air with excellent oxygen saturation. Continue incentive spirometer 3. Renal-no new labs, has been stable 4. Endo-blood glucose has been well controlled on his current regimen 5. Pain is well controlled this morning.   Plan: CXR this morning. Home this afternoon if CXR looks good. Continue to monitor BP closely but is improved this morning.    LOS: 5 days    Elgie Collard 06/19/2018

## 2018-06-19 NOTE — Plan of Care (Signed)
  Problem: Education: Goal: Knowledge of General Education information will improve Description Including pain rating scale, medication(s)/side effects and non-pharmacologic comfort measures Outcome: Adequate for Discharge   Problem: Clinical Measurements: Goal: Ability to maintain clinical measurements within normal limits will improve Outcome: Adequate for Discharge Goal: Will remain free from infection Outcome: Adequate for Discharge Goal: Diagnostic test results will improve Outcome: Adequate for Discharge Goal: Respiratory complications will improve Outcome: Adequate for Discharge Goal: Cardiovascular complication will be avoided Outcome: Adequate for Discharge   Problem: Education: Goal: Knowledge of the prescribed therapeutic regimen will improve Outcome: Adequate for Discharge   Problem: Activity: Goal: Risk for activity intolerance will decrease Outcome: Adequate for Discharge   Problem: Cardiac: Goal: Will achieve and/or maintain hemodynamic stability Outcome: Adequate for Discharge   Problem: Clinical Measurements: Goal: Postoperative complications will be avoided or minimized Outcome: Adequate for Discharge   Problem: Respiratory: Goal: Respiratory status will improve Outcome: Adequate for Discharge   Problem: Pain Management: Goal: Pain level will decrease Outcome: Adequate for Discharge   Problem: Skin Integrity: Goal: Wound healing without signs and symptoms infection will improve Outcome: Adequate for Discharge    DC instructions given to patient, questions answered. VSS. Educated on medication regimen, s/s infection, site care, restrictions, and follow up appointments. PIV DC, hemostasis achieved. All belongings sent home with patient.   Delayed DC, awaiting patient transportation from private vehicle driven by family.

## 2018-06-22 ENCOUNTER — Other Ambulatory Visit: Payer: Self-pay | Admitting: *Deleted

## 2018-06-22 NOTE — Progress Notes (Signed)
The proposed treatment discussed in cancer conference 06/22/2018 is for discussion purpose only and is not a binding recommendation.  The patient was not physically examined nor present for their treatment options.  Therefore, final treatment plans cannot be decided.

## 2018-06-29 ENCOUNTER — Ambulatory Visit (INDEPENDENT_AMBULATORY_CARE_PROVIDER_SITE_OTHER): Payer: Self-pay | Admitting: *Deleted

## 2018-06-29 ENCOUNTER — Other Ambulatory Visit: Payer: Self-pay

## 2018-06-29 DIAGNOSIS — C3431 Malignant neoplasm of lower lobe, right bronchus or lung: Secondary | ICD-10-CM

## 2018-06-29 DIAGNOSIS — Z4802 Encounter for removal of sutures: Secondary | ICD-10-CM

## 2018-06-29 DIAGNOSIS — Z902 Acquired absence of lung [part of]: Secondary | ICD-10-CM

## 2018-06-29 NOTE — Progress Notes (Signed)
Frederick Hall was scheduled for suture removal tomorrow, but called this morning with concerns of  how the sites looked today.  I suggested he come today for removal.  He is s/p RLLobe Segnetectomy.  On exam, his small thoracotomy incision is healing well with glue remaining.  There are two previous chest tube sites with sutures.  There is suture reaction with slight redness but no signs of infection.  The sutures were easily removed.  He is not using narcotics for pain at this point.  No other problems related.  He will return as scheduled with a CXR.

## 2018-07-12 ENCOUNTER — Other Ambulatory Visit: Payer: Self-pay

## 2018-07-12 ENCOUNTER — Other Ambulatory Visit: Payer: Self-pay | Admitting: Cardiothoracic Surgery

## 2018-07-12 DIAGNOSIS — C3430 Malignant neoplasm of lower lobe, unspecified bronchus or lung: Secondary | ICD-10-CM

## 2018-07-12 NOTE — Progress Notes (Signed)
[address]       [place] [address]             [phone]      Frederick Hall [facility] Medical Record #[id number] Date of Birth: Aug 13, 1940  Referring: Franchot Gallo, MD Primary Care: [name], MD Primary Cardiologist: No primary care provider on file.   Chief Complaint:   POST OP FOLLOW UP OPERATIVE REPORT DATE OF PROCEDURE:  06/14/2018 PREOPERATIVE DIAGNOSIS:  Adenocarcinoma, superior segment right lower lobe. POSTOPERATIVE DIAGNOSIS:  Adenocarcinoma, superior segment right lower lobe. SURGICAL PROCEDURE:  Right video-assisted thoracoscopy with right lower lobe segmentectomy and lymph node dissection and intercostal nerve block. SURGEON:  Lanelle Bal, MD  Cancer Staging Lung cancer, right lower lobe [facility]) Staging form: Lung, AJCC 8th Edition - Pathologic stage from 06/16/2018: Stage IA1 (pT1a, pN0, cM0) - Signed by [name], MD on 06/16/2018  History of Present Illness:      Patient returns office for follow-up visit after right superior segmentectomy for non-small cell carcinoma with a- adeno with a significant portion of lipidic component.  Patient is making good progress at home walking several miles a day.  He does have some tenderness and dysesthesias over the right chest.  Past Medical History:  Diagnosis Date  . Arthritis   . Asthma 2005   "attack x 1"  . BCC (basal cell carcinoma of skin) 04/15/2008   Right Forehead  . BCC (basal cell carcinoma of skin) 08/26/2016   Mid Forehead  . BPH (benign prostatic hyperplasia)   . Erectile dysfunction   . GERD (gastroesophageal reflux disease)   . Headache    "Stabbing"  . Hematuria 12/2016  . History of acute prostatitis 12/2016  . Hypertension   . Lipoma 02/08/2014   Small subcutaneous mass right temporal region, noted on MRI Brain, pt unaware  . Nodular basal cell carcinoma (BCC) 07/20/2016   Mid Forehead  . Nodular basal cell carcinoma (BCC) 05/26/2017    Left Nare  . Nodule of right lung 08/26/2017   69mm ground glass posterior nodule, noted on CT chest  . Nodulo-ulcerative basal cell carcinoma (BCC) 10/21/2016   Right Shin  . Osteoarthritis of left hip 08/07/2012  . Osteoarthritis of right hip 05/23/2012  . Prostate cancer ([place])   . Renal cyst, right 06/27/2017   Large peripelvic, noted on NM Bone scan  . SCC (squamous cell carcinoma) 01/27/2009   Left Outer Brow - Well Diff  . Seasonal allergies   . Skin cancer   . Squamous cell carcinoma in situ (SCCIS) 07/27/2013   Left Forehead  . Superficial basal cell carcinoma (BCC) 10/14/2014   Tip of Nose  . Wears hearing aid      Social History   Tobacco Use  Smoking Status Former Smoker  . Packs/day: 1.00  . Years: 15.00  . Pack years: 15.00  . Types: Cigarettes  . Quit date: 05/18/1972  . Years since quitting: 46.1  Smokeless Tobacco Never Used  Tobacco Comment   social alcohol    Social History   Substance and Sexual Activity  Alcohol Use Yes  . Alcohol/week: 7.0 standard drinks  . Types: 7 Standard drinks or equivalent per week   Comment: 3  wine or beer     Allergies  Allergen Reactions  . Penicillins Anaphylaxis, Itching and Rash    Childhood allergy. Tolerated cefazolin 05/23/12. Did it involve swelling of the face/tongue/throat, SOB, or low BP? Yes Did  it involve sudden or severe rash/hives, skin peeling, or any reaction on the inside of your mouth or nose? No Did you need to seek medical attention at a hospital or doctor's office? Yes When did it last happen?childhood allergy If all above answers are "NO", may proceed with cephalosporin use.    . Cefuroxime Axetil Diarrhea  . Other     general anesthesia - severe constipation   . Pollen Extract Itching    Seasonal allergies.    Current Outpatient Medications  Medication Sig Dispense Refill  . acetaminophen (TYLENOL) 500 MG tablet Take 500-1,000 mg by mouth every 8 (eight) hours as needed for  moderate pain or headache.    Marland Kitchen CALCIUM-MAGNESIUM-VITAMIN D PO Take 2 tablets by mouth daily.    . famotidine (PEPCID) 20 MG tablet Take 20 mg by mouth daily before breakfast.     . hydrochlorothiazide (MICROZIDE) 12.5 MG capsule Take 12.5 mg by mouth every other day.     Marland Kitchen leuprolide (LUPRON) 30 MG injection Inject 30 mg into the muscle every 4 (four) months. Last dose 05/26/18    . lisinopril (PRINIVIL,ZESTRIL) 20 MG tablet Take 20 mg by mouth every morning.    . Multiple Vitamin (MULTIVITAMIN WITH MINERALS) TABS tablet Take 1 tablet by mouth daily.    . Multiple Vitamins-Minerals (ZINC PO) Take 15 mg by mouth daily.     . naproxen sodium (ALEVE) 220 MG tablet Take 220 mg by mouth 2 (two) times daily as needed (heavy activity (golf, exercise)).     Marland Kitchen phenylephrine (NEO-SYNEPHRINE) 1 % nasal spray Place 1 drop into both nostrils every 6 (six) hours as needed for congestion.    . senna (SENOKOT) 8.6 MG TABS tablet Take 1 tablet by mouth daily.    . tadalafil (ADCIRCA/CIALIS) 20 MG tablet Take 20 mg by mouth daily as needed for erectile dysfunction.     . vitamin C (ASCORBIC ACID) 500 MG tablet Take 500 mg by mouth daily.     No current facility-administered medications for this visit.        Physical Exam: BP 133/85   Pulse 62   Temp 97.6 F (36.4 C) (Skin)   Resp 20   Ht 5\' 9"  (1.753 m)   Wt 190 lb (86.2 kg)   SpO2 97% Comment: RA  BMI 28.06 kg/m   General appearance: alert and cooperative Heart: regular rate and rhythm, S1, S2 normal, no murmur, click, rub or gallop Lungs: clear to auscultation bilaterally Abdomen: soft, non-tender; bowel sounds normal; no masses,  no organomegaly Extremities: extremities normal, atraumatic, no cyanosis or edema Wound: Right chest tube sites have healed well   Diagnostic Studies & Laboratory data:     Recent Radiology Findings:   Dg Chest 2 View  Result Date: 07/13/2018 CLINICAL DATA:  Malignant neoplasm of the lung. EXAM: CHEST - 2 VIEW  COMPARISON:  June 19, 2018 FINDINGS: The heart size and mediastinal contours are stable. The heart size is enlarged. Previously noted pleural line in the right apex is not seen on current exam. Patchy consolidation of the right lung base with right pleural effusion is unchanged. The visualized skeletal structures are stable. IMPRESSION: Previously noted pleural line in the right apex is not seen on current exam to suggest pneumothorax. Stable patchy consolidation of right lung base with right pleural effusion unchanged. Electronically Signed   By: Abelardo Diesel M.D.   On: 07/13/2018 08:47   I have independently reviewed the above radiology studies  and  reviewed the findings with the patient.    Recent Lab Findings: Lab Results  Component Value Date   WBC 8.3 06/16/2018   HGB 10.7 (L) 06/16/2018   HCT 33.5 (L) 06/16/2018   PLT 139 (L) 06/16/2018   GLUCOSE 124 (H) 06/16/2018   ALT 22 06/16/2018   AST 20 06/16/2018   NA 131 (L) 06/16/2018   K 3.9 06/16/2018   CL 98 06/16/2018   CREATININE 0.74 06/16/2018   BUN 8 06/16/2018   CO2 26 06/16/2018   INR 1.0 06/12/2018      Assessment / Plan:   Patient doing well after right superior segmentectomy for stage Ia adenocarcinoma of the lung Patient will continue increase his physical activity as tolerated avoiding heavy lifting Plan to see back in 2 months with a follow-up chest x-ray, follow-up CT scan 6 months postop  Medication Changes: No orders of the defined types were placed in this encounter.     [name] MD      [address] [place] [address] Office [phone]   Beeper [phone]  07/13/2018 9:33 AM

## 2018-07-13 ENCOUNTER — Ambulatory Visit
Admission: RE | Admit: 2018-07-13 | Discharge: 2018-07-13 | Disposition: A | Discharge status: Home / Self Care | Payer: Medicare Other | Source: Ambulatory Visit | Attending: Cardiothoracic Surgery | Admitting: Cardiothoracic Surgery

## 2018-07-13 ENCOUNTER — Ambulatory Visit (INDEPENDENT_AMBULATORY_CARE_PROVIDER_SITE_OTHER): Payer: Self-pay | Admitting: Cardiothoracic Surgery

## 2018-07-13 VITALS — BP 133/85 | HR 62 | Temp 97.6°F | Resp 20 | Ht 69.0 in | Wt 190.0 lb

## 2018-07-13 DIAGNOSIS — C3430 Malignant neoplasm of lower lobe, unspecified bronchus or lung: Secondary | ICD-10-CM

## 2018-07-13 DIAGNOSIS — Z902 Acquired absence of lung [part of]: Secondary | ICD-10-CM

## 2018-09-13 ENCOUNTER — Other Ambulatory Visit: Payer: Self-pay | Admitting: Cardiothoracic Surgery

## 2018-09-13 DIAGNOSIS — C3430 Malignant neoplasm of lower lobe, unspecified bronchus or lung: Secondary | ICD-10-CM

## 2018-09-14 ENCOUNTER — Ambulatory Visit (INDEPENDENT_AMBULATORY_CARE_PROVIDER_SITE_OTHER): Payer: Self-pay | Admitting: Cardiothoracic Surgery

## 2018-09-14 ENCOUNTER — Other Ambulatory Visit: Payer: Self-pay

## 2018-09-14 ENCOUNTER — Encounter: Payer: Self-pay | Admitting: Cardiothoracic Surgery

## 2018-09-14 ENCOUNTER — Ambulatory Visit
Admission: RE | Admit: 2018-09-14 | Discharge: 2018-09-14 | Disposition: A | Discharge status: Home / Self Care | Payer: Medicare Other | Source: Ambulatory Visit | Attending: Cardiothoracic Surgery | Admitting: Cardiothoracic Surgery

## 2018-09-14 VITALS — BP 119/78 | HR 57 | Temp 95.2°F | Resp 20 | Ht 69.0 in | Wt 192.0 lb

## 2018-09-14 DIAGNOSIS — C3430 Malignant neoplasm of lower lobe, unspecified bronchus or lung: Secondary | ICD-10-CM

## 2018-09-14 DIAGNOSIS — C3491 Malignant neoplasm of unspecified part of right bronchus or lung: Secondary | ICD-10-CM

## 2018-09-14 DIAGNOSIS — Z902 Acquired absence of lung [part of]: Secondary | ICD-10-CM

## 2018-09-14 NOTE — Progress Notes (Signed)
[address]       [place] [address]             [phone]      [facility] Medical Record #[id number] Date of Birth: 09/09/1940  Referring: Franchot Gallo, MD Primary Care: [name], MD Primary Cardiologist: No primary care provider on file.   Chief Complaint:   POST OP FOLLOW UP OPERATIVE REPORT DATE OF PROCEDURE:  06/14/2018 PREOPERATIVE DIAGNOSIS:  Adenocarcinoma, superior segment right lower lobe. POSTOPERATIVE DIAGNOSIS:  Adenocarcinoma, superior segment right lower lobe. SURGICAL PROCEDURE:  Right video-assisted thoracoscopy with right lower lobe segmentectomy and lymph node dissection and intercostal nerve block. SURGEON:  Lanelle Bal, MD  Cancer Staging Lung cancer, right lower lobe [facility]) Staging form: Lung, AJCC 8th Edition - Pathologic stage from 06/16/2018: Stage IA1 (pT1a, pN0, cM0) - Signed by [name], MD on 06/16/2018  History of Present Illness:      Patient returns office for follow-up visit after right superior segmentectomy for non-small cell carcinoma with a- adeno with a significant portion of lipidic component.   Patient has returned to playing golf on a regular basis.  Notes some shortness of breath when it is very hot out, notes he satting 51 yesterday    Past Medical History:  Diagnosis Date  . Arthritis   . Asthma 2005   "attack x 1"  . BCC (basal cell carcinoma of skin) 04/15/2008   Right Forehead  . BCC (basal cell carcinoma of skin) 08/26/2016   Mid Forehead  . BPH (benign prostatic hyperplasia)   . Erectile dysfunction   . GERD (gastroesophageal reflux disease)   . Headache    "Stabbing"  . Hematuria 12/2016  . History of acute prostatitis 12/2016  . Hypertension   . Lipoma 02/08/2014   Small subcutaneous mass right temporal region, noted on MRI Brain, pt unaware  . Nodular basal cell carcinoma (BCC) 07/20/2016   Mid Forehead  . Nodular basal cell carcinoma  (BCC) 05/26/2017   Left Nare  . Nodule of right lung 08/26/2017   4mm ground glass posterior nodule, noted on CT chest  . Nodulo-ulcerative basal cell carcinoma (BCC) 10/21/2016   Right Shin  . Osteoarthritis of left hip 08/07/2012  . Osteoarthritis of right hip 05/23/2012  . Prostate cancer ([place])   . Renal cyst, right 06/27/2017   Large peripelvic, noted on NM Bone scan  . SCC (squamous cell carcinoma) 01/27/2009   Left Outer Brow - Well Diff  . Seasonal allergies   . Skin cancer   . Squamous cell carcinoma in situ (SCCIS) 07/27/2013   Left Forehead  . Superficial basal cell carcinoma (BCC) 10/14/2014   Tip of Nose  . Wears hearing aid      Social History   Tobacco Use  Smoking Status Former Smoker  . Packs/day: 1.00  . Years: 15.00  . Pack years: 15.00  . Types: Cigarettes  . Quit date: 05/18/1972  . Years since quitting: 46.3  Smokeless Tobacco Never Used  Tobacco Comment   social alcohol    Social History   Substance and Sexual Activity  Alcohol Use Yes  . Alcohol/week: 7.0 standard drinks  . Types: 7 Standard drinks or equivalent per week   Comment: 3  wine or beer     Allergies  Allergen Reactions  . Penicillins Anaphylaxis, Itching and Rash    Childhood allergy. Tolerated cefazolin 05/23/12. Did it involve swelling of the face/tongue/throat,  SOB, or low BP? Yes Did it involve sudden or severe rash/hives, skin peeling, or any reaction on the inside of your mouth or nose? No Did you need to seek medical attention at a hospital or doctor's office? Yes When did it last happen?childhood allergy If all above answers are "NO", may proceed with cephalosporin use.    . Cefuroxime Axetil Diarrhea  . Other     general anesthesia - severe constipation   . Pollen Extract Itching    Seasonal allergies.    Current Outpatient Medications  Medication Sig Dispense Refill  . acetaminophen (TYLENOL) 500 MG tablet Take 500-1,000 mg by mouth every 8 (eight) hours  as needed for moderate pain or headache.    Marland Kitchen CALCIUM-MAGNESIUM-VITAMIN D PO Take 2 tablets by mouth daily.    . famotidine (PEPCID) 20 MG tablet Take 20 mg by mouth daily before breakfast.     . hydrochlorothiazide (MICROZIDE) 12.5 MG capsule Take 12.5 mg by mouth every other day.     Marland Kitchen leuprolide (LUPRON) 30 MG injection Inject 30 mg into the muscle every 4 (four) months. Last dose 05/26/18    . lisinopril (PRINIVIL,ZESTRIL) 20 MG tablet Take 20 mg by mouth every morning.    . Multiple Vitamin (MULTIVITAMIN WITH MINERALS) TABS tablet Take 1 tablet by mouth daily.    . Multiple Vitamins-Minerals (ZINC PO) Take 15 mg by mouth daily.     . naproxen sodium (ALEVE) 220 MG tablet Take 220 mg by mouth 2 (two) times daily as needed (heavy activity (golf, exercise)).     Marland Kitchen phenylephrine (NEO-SYNEPHRINE) 1 % nasal spray Place 1 drop into both nostrils every 6 (six) hours as needed for congestion.    . senna (SENOKOT) 8.6 MG TABS tablet Take 1 tablet by mouth daily.    . tadalafil (ADCIRCA/CIALIS) 20 MG tablet Take 20 mg by mouth daily as needed for erectile dysfunction.     . vitamin C (ASCORBIC ACID) 500 MG tablet Take 500 mg by mouth daily.     No current facility-administered medications for this visit.        Physical Exam: BP 119/78   Pulse (!) 57   Temp (!) 95.2 F (35.1 C) (Skin)   Resp 20   Ht 5\' 9"  (1.753 m)   Wt 192 lb (87.1 kg)   SpO2 96% Comment: RA  BMI 28.35 kg/m  General appearance: alert, cooperative and no distress Neck: no adenopathy, no carotid bruit, no JVD, supple, symmetrical, trachea midline and thyroid not enlarged, symmetric, no tenderness/mass/nodules Lymph nodes: Cervical, supraclavicular, and axillary nodes normal. Cardio: regular rate and rhythm, S1, S2 normal, no murmur, click, rub or gallop GI: soft, non-tender; bowel sounds normal; no masses,  no organomegaly Extremities: extremities normal, atraumatic, no cyanosis or edema and Homans sign is negative, no  sign of DVT Neurologic: Grossly normal Right port and incision sites are well-healed  Diagnostic Studies & Laboratory data:     Recent Radiology Findings:   Dg Chest 2 View  Result Date: 09/14/2018 CLINICAL DATA:  78 year old male with lung cancer. EXAM: CHEST - 2 VIEW COMPARISON:  Chest radiograph dated 07/13/2018 FINDINGS: There is no focal consolidation, pleural effusion, or pneumothorax. Right lower lung field surgical suture noted. Top-normal cardiac silhouette. Atherosclerotic calcification of the aortic arch. No acute osseous pathology. IMPRESSION: No active cardiopulmonary disease. Electronically Signed   By: Anner Crete M.D.   On: 09/14/2018 10:17   I have independently reviewed the above radiology studies  and  reviewed the findings with the patient.    Recent Lab Findings: Lab Results  Component Value Date   WBC 8.3 06/16/2018   HGB 10.7 (L) 06/16/2018   HCT 33.5 (L) 06/16/2018   PLT 139 (L) 06/16/2018   GLUCOSE 124 (H) 06/16/2018   ALT 22 06/16/2018   AST 20 06/16/2018   NA 131 (L) 06/16/2018   K 3.9 06/16/2018   CL 98 06/16/2018   CREATININE 0.74 06/16/2018   BUN 8 06/16/2018   CO2 26 06/16/2018   INR 1.0 06/12/2018      Assessment / Plan:   Stable after superior segmentectomy right for stage Ia non-small cell carcinoma of the lung.  Patient doing well postoperatively.  Follow-up CT of the chest in December 2020, which will be 6 months postop    Medication Changes: No orders of the defined types were placed in this encounter.     [name] MD      [address] [place] [address] Office [phone]   Beeper [phone]  09/14/2018 10:58 AM

## 2018-11-27 ENCOUNTER — Other Ambulatory Visit: Payer: Self-pay | Admitting: *Deleted

## 2018-11-27 DIAGNOSIS — Z85118 Personal history of other malignant neoplasm of bronchus and lung: Secondary | ICD-10-CM

## 2018-12-21 ENCOUNTER — Encounter: Payer: Medicare Other | Admitting: Cardiothoracic Surgery

## 2018-12-21 ENCOUNTER — Other Ambulatory Visit: Payer: Medicare Other

## 2019-01-18 ENCOUNTER — Ambulatory Visit
Admission: RE | Admit: 2019-01-18 | Discharge: 2019-01-18 | Disposition: A | Discharge status: Home / Self Care | Payer: Medicare Other | Source: Ambulatory Visit | Attending: Cardiothoracic Surgery | Admitting: Cardiothoracic Surgery

## 2019-01-18 ENCOUNTER — Ambulatory Visit (INDEPENDENT_AMBULATORY_CARE_PROVIDER_SITE_OTHER): Payer: Medicare Other | Admitting: Cardiothoracic Surgery

## 2019-01-18 ENCOUNTER — Other Ambulatory Visit: Payer: Self-pay

## 2019-01-18 ENCOUNTER — Encounter: Payer: Self-pay | Admitting: Cardiothoracic Surgery

## 2019-01-18 VITALS — BP 166/84 | HR 60 | Temp 97.7°F | Resp 16 | Ht 69.0 in | Wt 190.0 lb

## 2019-01-18 DIAGNOSIS — C3491 Malignant neoplasm of unspecified part of right bronchus or lung: Secondary | ICD-10-CM | POA: Diagnosis not present

## 2019-01-18 DIAGNOSIS — Z85118 Personal history of other malignant neoplasm of bronchus and lung: Secondary | ICD-10-CM

## 2019-01-18 DIAGNOSIS — Z902 Acquired absence of lung [part of]: Secondary | ICD-10-CM

## 2019-01-18 NOTE — Progress Notes (Signed)
[address]       [place] [address]             [phone]      [facility] Medical Record #[id number] Date of Birth: December 05, 1940  Referring: Franchot Gallo, MD Primary Care: [name], MD Primary Cardiologist: No primary care provider on file.   Chief Complaint:   POST OP FOLLOW UP OPERATIVE REPORT DATE OF PROCEDURE:  06/14/2018 PREOPERATIVE DIAGNOSIS:  Adenocarcinoma, superior segment right lower lobe. POSTOPERATIVE DIAGNOSIS:  Adenocarcinoma, superior segment right lower lobe. SURGICAL PROCEDURE:  Right video-assisted thoracoscopy with right lower lobe segmentectomy and lymph node dissection and intercostal nerve block. SURGEON:  Lanelle Bal, MD  Cancer Staging Lung cancer, right lower lobe [facility]) Staging form: Lung, AJCC 8th Edition - Pathologic stage from 06/16/2018: Stage IA1 (pT1a, pN0, cM0) - Signed by [name], MD on 06/16/2018  History of Present Illness:      Patient returns office for follow-up visit after right superior segmentectomy for non-small cell carcinoma with a- adeno with a significant portion of lipidic component.   Patient has returned to near normal activities.  Notes since last seen he has had no signs or symptoms of Covid infection, and has been careful to take precautions.  Past Medical History:  Diagnosis Date  . Arthritis   . Asthma 2005   "attack x 1"  . BCC (basal cell carcinoma of skin) 04/15/2008   Right Forehead  . BCC (basal cell carcinoma of skin) 08/26/2016   Mid Forehead  . BPH (benign prostatic hyperplasia)   . Erectile dysfunction   . GERD (gastroesophageal reflux disease)   . Headache    "Stabbing"  . Hematuria 12/2016  . History of acute prostatitis 12/2016  . Hypertension   . Lipoma 02/08/2014   Small subcutaneous mass right temporal region, noted on MRI Brain, pt unaware  . Nodular basal cell carcinoma (BCC) 07/20/2016   Mid Forehead  . Nodular basal  cell carcinoma (BCC) 05/26/2017   Left Nare  . Nodule of right lung 08/26/2017   108mm ground glass posterior nodule, noted on CT chest  . Nodulo-ulcerative basal cell carcinoma (BCC) 10/21/2016   Right Shin  . Osteoarthritis of left hip 08/07/2012  . Osteoarthritis of right hip 05/23/2012  . Prostate cancer ([place])   . Renal cyst, right 06/27/2017   Large peripelvic, noted on NM Bone scan  . SCC (squamous cell carcinoma) 01/27/2009   Left Outer Brow - Well Diff  . Seasonal allergies   . Skin cancer   . Squamous cell carcinoma in situ (SCCIS) 07/27/2013   Left Forehead  . Superficial basal cell carcinoma (BCC) 10/14/2014   Tip of Nose  . Wears hearing aid      Social History   Tobacco Use  Smoking Status Former Smoker  . Packs/day: 1.00  . Years: 15.00  . Pack years: 15.00  . Types: Cigarettes  . Quit date: 05/18/1972  . Years since quitting: 46.7  Smokeless Tobacco Never Used  Tobacco Comment   social alcohol    Social History   Substance and Sexual Activity  Alcohol Use Yes  . Alcohol/week: 7.0 standard drinks  . Types: 7 Standard drinks or equivalent per week   Comment: 3  wine or beer     Allergies  Allergen Reactions  . Penicillins Anaphylaxis, Itching and Rash    Childhood allergy. Tolerated cefazolin 05/23/12. Did it involve swelling of the face/tongue/throat,  SOB, or low BP? Yes Did it involve sudden or severe rash/hives, skin peeling, or any reaction on the inside of your mouth or nose? No Did you need to seek medical attention at a hospital or doctor's office? Yes When did it last happen?childhood allergy If all above answers are "NO", may proceed with cephalosporin use.    . Cefuroxime Axetil Diarrhea  . Other     general anesthesia - severe constipation   . Pollen Extract Itching    Seasonal allergies.    Current Outpatient Medications  Medication Sig Dispense Refill  . acetaminophen (TYLENOL) 500 MG tablet Take 500-1,000 mg by mouth every  8 (eight) hours as needed for moderate pain or headache.    Marland Kitchen CALCIUM-MAGNESIUM-VITAMIN D PO Take 2 tablets by mouth daily.    . famotidine (PEPCID) 20 MG tablet Take 20 mg by mouth daily before breakfast.     . hydrochlorothiazide (MICROZIDE) 12.5 MG capsule Take 12.5 mg by mouth every other day.     Marland Kitchen leuprolide (LUPRON) 30 MG injection Inject 30 mg into the muscle every 4 (four) months. Last dose 09/28/18    . lisinopril (PRINIVIL,ZESTRIL) 20 MG tablet Take 20 mg by mouth every morning.    . Multiple Vitamin (MULTIVITAMIN WITH MINERALS) TABS tablet Take 1 tablet by mouth daily.    . Multiple Vitamins-Minerals (ZINC PO) Take 15 mg by mouth daily.     . naproxen sodium (ALEVE) 220 MG tablet Take 220 mg by mouth 2 (two) times daily as needed (heavy activity (golf, exercise)).     Marland Kitchen phenylephrine (NEO-SYNEPHRINE) 1 % nasal spray Place 1 drop into both nostrils every 6 (six) hours as needed for congestion.    . senna (SENOKOT) 8.6 MG TABS tablet Take 1 tablet by mouth daily.    . tadalafil (ADCIRCA/CIALIS) 20 MG tablet Take 20 mg by mouth daily as needed for erectile dysfunction.     . vitamin C (ASCORBIC ACID) 500 MG tablet Take 500 mg by mouth daily.     No current facility-administered medications for this visit.       Physical Exam: BP (!) 166/84 (BP Location: Right Arm, Patient Position: Sitting, Cuff Size: Normal)   Pulse 60   Temp 97.7 F (36.5 C)   Resp 16   Ht 5\' 9"  (1.753 m)   Wt 86.2 kg   SpO2 98% Comment: RA  BMI 28.06 kg/m  General appearance: alert, cooperative and no distress Lymph nodes: Cervical, supraclavicular, and axillary nodes normal. Resp: clear to auscultation bilaterally Cardio: regular rate and rhythm, S1, S2 normal, no murmur, click, rub or gallop GI: soft, non-tender; bowel sounds normal; no masses,  no organomegaly Extremities: extremities normal, atraumatic, no cyanosis or edema and Homans sign is negative, no sign of DVT Neurologic: Grossly normal    Right port and incision sites are well-healed  Diagnostic Studies & Laboratory data:     Recent Radiology Findings:   CT CHEST WO CONTRAST  Result Date: 01/18/2019 CLINICAL DATA:  Non-small-cell lung cancer. Chest tightness at surgical site. right lung surgery 6/20. Remote smoking history. EXAM: CT CHEST WITHOUT CONTRAST TECHNIQUE: Multidetector CT imaging of the chest was performed following the standard protocol without IV contrast. COMPARISON:  09/14/2018 chest radiograph.  Most recent CT 03/09/2018 FINDINGS: Cardiovascular: Aortic and branch vessel atherosclerosis. Tortuous thoracic aorta. Mild cardiomegaly, without pericardial effusion. Aortic valve calcification. Mediastinum/Nodes: No mediastinal or definite hilar adenopathy, given limitations of unenhanced CT. Lungs/Pleura: No pleural fluid. Nodularity along the right  minor fissure is unchanged, likely due to subpleural lymph nodes. Scattered right lower lobe subpleural tiny pulmonary nodules are similar and also favored to represent subpleural lymph nodes. The largest measures 4 mm on 127/8. Right lower lobe wedge resection, without residual disease. 2 mm anterior left lower lobe subpleural pulmonary nodule is similar and favored to represent a subpleural lymph node. Upper Abdomen: Normal imaged portions of the liver, spleen, stomach, pancreas, gallbladder, adrenal glands, left kidney. Anterior interpolar right renal 3.0 cm incompletely imaged low-density lesion, likely a cyst. Musculoskeletal: Moderate thoracic spondylosis. IMPRESSION: 1. Interval right lower lobe wedge resection. No findings of residual/recurrent or metastatic disease. 2. Scattered tiny pulmonary nodules are similar, primarily favored to represent subpleural lymph nodes. 3.  Aortic Atherosclerosis (ICD10-I70.0). Electronically Signed   By: Abigail Miyamoto M.D.   On: 01/18/2019 10:47   I have independently reviewed the above radiology studies  and reviewed the findings with the  patient.    Recent Lab Findings: Lab Results  Component Value Date   WBC 8.3 06/16/2018   HGB 10.7 (L) 06/16/2018   HCT 33.5 (L) 06/16/2018   PLT 139 (L) 06/16/2018   GLUCOSE 124 (H) 06/16/2018   ALT 22 06/16/2018   AST 20 06/16/2018   NA 131 (L) 06/16/2018   K 3.9 06/16/2018   CL 98 06/16/2018   CREATININE 0.74 06/16/2018   BUN 8 06/16/2018   CO2 26 06/16/2018   INR 1.0 06/12/2018      Assessment / Plan:   Stable after superior segmentectomy right for stage Ia non-small cell carcinoma of the lung.  Patient doing well postoperatively.   Follow-up CT scan now approximately 6 months postop shows no evidence of recurrence-we will plan follow-up CT of the chest in 6 months. Discussed with the patient the process to get face to Covid vaccination     Medication Changes: No orders of the defined types were placed in this encounter.     [name] MD      [address] [place] [address] Office [phone]   Beeper [phone]  01/18/2019 11:56 AM

## 2019-02-07 NOTE — Progress Notes (Signed)
CARDIOLOGY OFFICE NOTE  Date:  02/13/2019    Frederick Hall Date of Birth: Oct 10, 1940 Medical Record #[id number]  PCP:  Frederick Sacramento, MD  Cardiologist:  Frederick Hall     Chief Complaint  Patient presents with  . Hypertension    New patient visit    History of Present Illness: Frederick Hall is a 79 y.o. male who presents today for a new patient visit. I see his wife - Frederick Hall who is the daughter of Frederick Hall, my long time patient as well.   He has a history of prostate cancer, ground glass opacity in the right lower lung - s/p biopsy + for adenocarcinoma - s/p VATS with lung resection of the right lower lobe per EBG in 2020, past smoker, and HTN.   I saw his wife a few weeks ago - she asked if I would see her husband for better BP control.    The patient does not have symptoms concerning for COVID-19 infection (fever, chills, cough, or new shortness of breath).   Comes in today. Here alone. He is retired from [employer] - has been for over 15 years now. No longer smoking. Does have daily alcohol use - several drinks/beer per day. History of HTN for over 30 years. Has been on Lupron for his prostate cancer with subsequent weight gain/generalized weakness. Father died at 24 with intestinal obstruction/liposarcoma. Mother died at 69 with metastatic lung cancer. He has no chest pain whatsoever. Breathing is ok for the most part - very little DOE. He is not as active but he and Frederick Hall still try to walk some most days. He notes the Lupron caused significant weight gain - this is improving. No actual swelling. BP has not really been controlled over the past 4 or so years - has been on ACE for many years - has had HCTZ added - could not take this every day - caused his BP to drop (in the 90's) so taking every other day but does not do this consistently. Recent OV with GU and TCTS showed BP to be high. He does use salt - likes pretzels and will use salt on  potatoes. He does take some occasional NSAID for generalized aches. He notes that his lipids are typically elevated but has high HLD - he is not on therapy.   CT from last month with aortic atherosclerosis and mild cardiomegaly noted.   Past Medical History:  Diagnosis Date  . Arthritis   . Asthma 2005   "attack x 1"  . BCC (basal cell carcinoma of skin) 04/15/2008   Right Forehead  . BCC (basal cell carcinoma of skin) 08/26/2016   Mid Forehead  . BPH (benign prostatic hyperplasia)   . Erectile dysfunction   . GERD (gastroesophageal reflux disease)   . Headache    "Stabbing"  . Hematuria 12/2016  . History of acute prostatitis 12/2016  . Hypertension   . Lipoma 02/08/2014   Small subcutaneous mass right temporal region, noted on MRI Brain, pt unaware  . Nodular basal cell carcinoma (BCC) 07/20/2016   Mid Forehead  . Nodular basal cell carcinoma (BCC) 05/26/2017   Left Nare  . Nodule of right lung 08/26/2017   82mm ground glass posterior nodule, noted on CT chest  . Nodulo-ulcerative basal cell carcinoma (BCC) 10/21/2016   Right Shin  . Osteoarthritis of left hip 08/07/2012  . Osteoarthritis of right hip 05/23/2012  . Prostate cancer ([place])   .  Renal cyst, right 06/27/2017   Large peripelvic, noted on NM Bone scan  . SCC (squamous cell carcinoma) 01/27/2009   Left Outer Brow - Well Diff  . Seasonal allergies   . Skin cancer   . Squamous cell carcinoma in situ (SCCIS) 07/27/2013   Left Forehead  . Superficial basal cell carcinoma (BCC) 10/14/2014   Tip of Nose  . Wears hearing aid     Past Surgical History:  Procedure Laterality Date  . COLONOSCOPY  09/2017  . CYSTOSCOPY N/A 09/29/2017   Procedure: CYSTOSCOPY;  Surgeon: Franchot Gallo, MD;  Location: [facility];  Service: Urology;  Laterality: N/A;  no seeds in bladder per Dr Diona Fanti  . INTERCOSTAL NERVE BLOCK  06/14/2018   Procedure: Intercostal Nerve Block;  Surgeon: [name], MD;   Location: [place];  Service: Thoracic;;  . KNEE ARTHROSCOPY Left   . LIPOMA EXCISION     multiple  . LYMPH NODE DISSECTION  06/14/2018   Procedure: Lymph Node Dissection;  Surgeon: [name], MD;  Location: [place];  Service: Thoracic;;  . RADIOACTIVE SEED IMPLANT N/A 09/29/2017   Procedure: RADIOACTIVE SEED IMPLANT/BRACHYTHERAPY IMPLANT;  Surgeon: Franchot Gallo, MD;  Location: [facility];  Service: Urology;  Laterality: N/A;  63 seeds  . SHOULDER ARTHROSCOPY Right   . SPACE OAR INSTILLATION N/A 09/29/2017   Procedure: SPACE OAR INSTILLATION;  Surgeon: Franchot Gallo, MD;  Location: [facility];  Service: Urology;  Laterality: N/A;  . TONSILLECTOMY    . TOTAL HIP ARTHROPLASTY Right 05/23/2012   Procedure: TOTAL HIP ARTHROPLASTY;  Surgeon: Johnny Bridge, MD;  Location: [facility];  Service: Orthopedics;  Laterality: Right;  . TOTAL HIP ARTHROPLASTY Left 08/07/2012   Procedure: TOTAL HIP ARTHROPLASTY- left ;  Surgeon: Johnny Bridge, MD;  Location: [place];  Service: Orthopedics;  Laterality: Left;  Marland Kitchen VIDEO ASSISTED THORACOSCOPY (VATS)/WEDGE RESECTION Right 06/14/2018   Procedure: VIDEO ASSISTED THORACOSCOPY (VATS) WITH LUNG RESECTION OF SUPERIOR SEGMENT OF RIGHT LOWER LOBE;  Surgeon: [name], MD;  Location: [place];  Service: Thoracic;  Laterality: Right;     Medications: Current Meds  Medication Sig  . acetaminophen (TYLENOL) 500 MG tablet Take 500-1,000 mg by mouth every 8 (eight) hours as needed for moderate pain or headache.  Marland Kitchen CALCIUM-MAGNESIUM-VITAMIN D PO Take 2 tablets by mouth daily.  . famotidine (PEPCID) 20 MG tablet Take 20 mg by mouth daily before breakfast.   . hydrochlorothiazide (MICROZIDE) 12.5 MG capsule Take 12.5 mg by mouth every other day.   Marland Kitchen leuprolide (LUPRON) 30 MG injection Inject 30 mg into the muscle every 4 (four) months. Last dose 09/28/18  . lisinopril (PRINIVIL,ZESTRIL) 20 MG tablet Take 20 mg by mouth every  morning.  . Multiple Vitamin (MULTIVITAMIN WITH MINERALS) TABS tablet Take 1 tablet by mouth daily.  . Multiple Vitamins-Minerals (ZINC PO) Take 15 mg by mouth daily.   . naproxen sodium (ALEVE) 220 MG tablet Take 220 mg by mouth 2 (two) times daily as needed (heavy activity (golf, exercise)).   Marland Kitchen phenylephrine (NEO-SYNEPHRINE) 1 % nasal spray Place 1 drop into both nostrils every 6 (six) hours as needed for congestion.  . senna (SENOKOT) 8.6 MG TABS tablet Take 1 tablet by mouth daily.  . tadalafil (ADCIRCA/CIALIS) 20 MG tablet Take 20 mg by mouth daily as needed for erectile dysfunction.   . vitamin C (ASCORBIC ACID) 500 MG tablet Take 500 mg by mouth daily.  Allergies: Allergies  Allergen Reactions  . Penicillins Anaphylaxis, Itching and Rash    Childhood allergy. Tolerated cefazolin 05/23/12. Did it involve swelling of the face/tongue/throat, SOB, or low BP? Yes Did it involve sudden or severe rash/hives, skin peeling, or any reaction on the inside of your mouth or nose? No Did you need to seek medical attention at a hospital or doctor's office? Yes When did it last happen?childhood allergy If all above answers are "NO", may proceed with cephalosporin use.    . Cefuroxime Axetil Diarrhea  . Other     general anesthesia - severe constipation   . Pollen Extract Itching    Seasonal allergies.    Social History: The patient  reports that he quit smoking about 46 years ago. His smoking use included cigarettes. He has a 15.00 pack-year smoking history. He has never used smokeless tobacco. He reports current alcohol use of about 7.0 standard drinks of alcohol per week. He reports that he does not use drugs.   Family History: The patient's family history includes Cancer in his father and mother.   Review of Systems: Please see the history of present illness.   All other systems are reviewed and negative.   Physical Exam: VS:  BP (!) 164/96   Pulse 60   Ht 5\' 9"  (1.753 m)    Wt 191 lb 1.9 oz (86.7 kg)   SpO2 98%   BMI 28.22 kg/m  .  BMI Body mass index is 28.22 kg/m.  Wt Readings from Last 3 Encounters:  02/13/19 191 lb 1.9 oz (86.7 kg)  01/18/19 190 lb (86.2 kg)  09/14/18 192 lb (87.1 kg)    General: Pleasant. Well developed, well nourished and in no acute distress.   HEENT: Normal.  Neck: Supple, no JVD, carotid bruits, or masses noted.  Cardiac: Regular rate and rhythm. No murmurs, rubs, or gallops. No edema.  Respiratory:  Lungs are clear to auscultation bilaterally with normal work of breathing.  GI: Soft and nontender.  MS: No deformity or atrophy. Gait and ROM intact.  Skin: Warm and dry. Color is normal.  Neuro:  Strength and sensation are intact and no gross focal deficits noted.  Psych: Alert, appropriate and with normal affect.   LABORATORY DATA:  EKG:  EKG is ordered today. This demonstrates NSR - HR is 60 - no acute changes.  Lab Results  Component Value Date   WBC 8.3 06/16/2018   HGB 10.7 (L) 06/16/2018   HCT 33.5 (L) 06/16/2018   PLT 139 (L) 06/16/2018   GLUCOSE 124 (H) 06/16/2018   ALT 22 06/16/2018   AST 20 06/16/2018   NA 131 (L) 06/16/2018   K 3.9 06/16/2018   CL 98 06/16/2018   CREATININE 0.74 06/16/2018   BUN 8 06/16/2018   CO2 26 06/16/2018   INR 1.0 06/12/2018     BNP (last 3 results) No results for input(s): BNP in the last 8760 hours.  ProBNP (last 3 results) No results for input(s): PROBNP in the last 8760 hours.   Other Studies Reviewed Today:   Assessment/Plan:  1. HTN - long standing - needs better salt restriction - would restart the HCTZ every other day as he has this on hand - will check echocardiogram as well to look at diastolic function. He is to monitor BP at home. Lab today. Limit NSAID use.   2. Prior VATS for lung cancer from June 2020 - stable recent scan - follow up per [facility].   3.  Prostate cancer - was on Lupron - therapy completed.   4. Former smoker - has quit  5. HLD - needs  lab today.   6. Cardiomegaly - mild by recent CT - getting echocardiogram.   7. Aortic atherosclerosis - no active symptoms - would recommend aggressive CV risk factor modification. Checking echocardiogram.   8. COVID-19 Education: The signs and symptoms of COVID-19 were discussed with the patient and how to seek care for testing (follow up with PCP or arrange E-visit).  The importance of social distancing, staying at home, hand hygiene and wearing a mask when out in public were discussed today.  Current medicines are reviewed with the patient today.  The patient does not have concerns regarding medicines other than what has been noted above.  The following changes have been made:  See above.  Labs/ tests ordered today include:    Orders Placed This Encounter  Procedures  . Basic metabolic panel  . CBC  . Hepatic function panel  . Lipid panel  . TSH  . EKG 12-Lead  . ECHOCARDIOGRAM COMPLETE     Disposition:   FU with me in one month.   Patient is agreeable to this plan and will call if any problems develop in the interim.   SignedTruitt Merle, NP  02/13/2019 9:47 AM  [facility] [address] [place], [place]  [address] Phone: [phone] Fax: [phone]

## 2019-02-13 ENCOUNTER — Other Ambulatory Visit: Payer: Self-pay

## 2019-02-13 ENCOUNTER — Encounter: Payer: Self-pay | Admitting: Nurse Practitioner

## 2019-02-13 ENCOUNTER — Ambulatory Visit (INDEPENDENT_AMBULATORY_CARE_PROVIDER_SITE_OTHER): Payer: Medicare Other | Admitting: Nurse Practitioner

## 2019-02-13 VITALS — BP 164/96 | HR 60 | Ht 69.0 in | Wt 191.1 lb

## 2019-02-13 DIAGNOSIS — E7800 Pure hypercholesterolemia, unspecified: Secondary | ICD-10-CM | POA: Diagnosis not present

## 2019-02-13 DIAGNOSIS — I517 Cardiomegaly: Secondary | ICD-10-CM | POA: Diagnosis not present

## 2019-02-13 DIAGNOSIS — I10 Essential (primary) hypertension: Secondary | ICD-10-CM | POA: Diagnosis not present

## 2019-02-13 DIAGNOSIS — I700 Atherosclerosis of aorta: Secondary | ICD-10-CM | POA: Diagnosis not present

## 2019-02-13 DIAGNOSIS — Z7189 Other specified counseling: Secondary | ICD-10-CM

## 2019-02-13 LAB — CBC
Hematocrit: 38.6 % (ref 37.5–51.0)
Hemoglobin: 13.2 g/dL (ref 13.0–17.7)
MCH: 31.4 pg (ref 26.6–33.0)
MCHC: 34.2 g/dL (ref 31.5–35.7)
MCV: 92 fL (ref 79–97)
Platelets: 174 10*3/uL (ref 150–450)
RBC: 4.21 x10E6/uL (ref 4.14–5.80)
RDW: 14.5 % (ref 11.6–15.4)
WBC: 6.7 10*3/uL (ref 3.4–10.8)

## 2019-02-13 LAB — LIPID PANEL
Chol/HDL Ratio: 2.9 ratio (ref 0.0–5.0)
Cholesterol, Total: 254 mg/dL — ABNORMAL HIGH (ref 100–199)
HDL: 89 mg/dL (ref >39)
LDL Chol Calc (NIH): 149 mg/dL — ABNORMAL HIGH (ref 0–99)
Triglycerides: 95 mg/dL (ref 0–149)
VLDL Cholesterol Cal: 16 mg/dL (ref 5–40)

## 2019-02-13 LAB — BASIC METABOLIC PANEL
BUN/Creatinine Ratio: 15 (ref 10–24)
BUN: 12 mg/dL (ref 8–27)
CO2: 21 mmol/L (ref 20–29)
Calcium: 9.5 mg/dL (ref 8.6–10.2)
Chloride: 103 mmol/L (ref 96–106)
Creatinine, Ser: 0.82 mg/dL (ref 0.76–1.27)
GFR calc Af Amer: 99 mL/min/{1.73_m2} (ref >59)
GFR calc non Af Amer: 86 mL/min/{1.73_m2} (ref >59)
Glucose: 94 mg/dL (ref 65–99)
Potassium: 4.2 mmol/L (ref 3.5–5.2)
Sodium: 139 mmol/L (ref 134–144)

## 2019-02-13 LAB — HEPATIC FUNCTION PANEL
ALT: 20 IU/L (ref 0–44)
AST: 19 IU/L (ref 0–40)
Albumin: 4.2 g/dL (ref 3.7–4.7)
Alkaline Phosphatase: 90 IU/L (ref 39–117)
Bilirubin Total: 0.3 mg/dL (ref 0.0–1.2)
Bilirubin, Direct: 0.09 mg/dL (ref 0.00–0.40)
Total Protein: 6.3 g/dL (ref 6.0–8.5)

## 2019-02-13 LAB — TSH: TSH: 1.67 u[IU]/mL (ref 0.450–4.500)

## 2019-02-13 NOTE — Patient Instructions (Addendum)
After Visit Summary:  We will be checking the following labs today - BMET, CBC, HPF, Lipids and TSH   Medication Instructions:    Continue with your current medicines.   Lets take the HCTZ every other day for the next month   If you need a refill on your cardiac medications before your next appointment, please call your pharmacy.     Testing/Procedures To Be Arranged:  Echocardiogram  Follow-Up:   See me in about one month    At [facility], you and your health needs are our priority.  As part of our continuing mission to provide you with exceptional heart care, we have created designated Provider Care Teams.  These Care Teams include your primary Cardiologist (physician) and Advanced Practice Providers (APPs -  Physician Assistants and Nurse Practitioners) who all work together to provide you with the care you need, when you need it.  Special Instructions:  . Stay safe, stay home, wash your hands for at least 20 seconds and wear a mask when out in public.  . It was good to talk with you today. . Cut back on the salt shaker and the pretzels . Monitor your BP for me - various times  . Try to limit use of Advil, Aleve, etc.    Call the [place] office at [phone] if you have any questions, problems or concerns.

## 2019-02-15 ENCOUNTER — Other Ambulatory Visit: Payer: Self-pay

## 2019-02-15 ENCOUNTER — Ambulatory Visit (HOSPITAL_COMMUNITY): Payer: Medicare Other | Attending: Cardiology

## 2019-02-15 DIAGNOSIS — I10 Essential (primary) hypertension: Secondary | ICD-10-CM | POA: Diagnosis present

## 2019-02-15 DIAGNOSIS — E7800 Pure hypercholesterolemia, unspecified: Secondary | ICD-10-CM | POA: Diagnosis present

## 2019-03-07 NOTE — Progress Notes (Signed)
CARDIOLOGY OFFICE NOTE  Date:  03/12/2019    Frederick Hall Date of Birth: January 17, 1940 Medical Record #[id number]  PCP:  [name], MD  Cardiologist:  Servando Snare    Chief Complaint  Patient presents with  . Follow-up    History of Present Illness: Frederick Hall is a 79 y.o. male who presents today for a one month follow up visit.  I see his wife - Frederick Hall who is the daughter of Frederick Hall, my long time patient as well. He has asked to follow with me.   He has a history of prostate cancer, ground glass opacity in the right lower lung - s/p biopsy + for adenocarcinoma - s/p VATS with lung resection of the right lower lobe per EBG in 2020, past smoker, and HTN.   He came to see me a month ago for BP control. Former smoker. Daily alcohol. Has had HTN for over 30 years. Also with prostate cancer on Lupron. Very little DOE. BP not controlled over the past several years. Some lability and he was doing HCTZ sporadically due to some lower BP readings. Really likes salt - especially pretzels. We got an echo - changed his medicines and checked labs. Prior CT with aortic atherosclerosis and mild CM noted - needs aggressive CV risk factor modification.    The patient does not have symptoms concerning for COVID-19 infection (fever, chills, cough, or new shortness of breath).   Comes in today. Here alone. Took a few BP readings in the middle of the night - typically gets up to use the bathroom due to his nocturia from his prostate cancer - felt a little dizzy - sometimes his BP would be lower. Otherwise, BP looks ok. Still struggles with the effects of his Lupron - still with not lots of energy and has continued fatigue - he feels like is deconditioned. He has finished that therapy - hopefully this will improve with time. Doing less salt. No chest pain. Not short of breath. Otherwise, feels good. Still able to do his 30 minute walk without issue.   Past Medical History:    Diagnosis Date  . Arthritis   . Asthma 2005   "attack x 1"  . BCC (basal cell carcinoma of skin) 04/15/2008   Right Forehead  . BCC (basal cell carcinoma of skin) 08/26/2016   Mid Forehead  . BPH (benign prostatic hyperplasia)   . Erectile dysfunction   . GERD (gastroesophageal reflux disease)   . Headache    "Stabbing"  . Hematuria 12/2016  . History of acute prostatitis 12/2016  . Hypertension   . Lipoma 02/08/2014   Small subcutaneous mass right temporal region, noted on MRI Brain, pt unaware  . Nodular basal cell carcinoma (BCC) 07/20/2016   Mid Forehead  . Nodular basal cell carcinoma (BCC) 05/26/2017   Left Nare  . Nodule of right lung 08/26/2017   63mm ground glass posterior nodule, noted on CT chest  . Nodulo-ulcerative basal cell carcinoma (BCC) 10/21/2016   Right Shin  . Osteoarthritis of left hip 08/07/2012  . Osteoarthritis of right hip 05/23/2012  . Prostate cancer ([place])   . Renal cyst, right 06/27/2017   Large peripelvic, noted on [facility] Bone scan  . SCC (squamous cell carcinoma) 01/27/2009   Left Outer Brow - Well Diff  . Seasonal allergies   . Skin cancer   . Squamous cell carcinoma in situ (SCCIS) 07/27/2013   Left Forehead  . Superficial basal cell carcinoma (  BCC) 10/14/2014   Tip of Nose  . Wears hearing aid     Past Surgical History:  Procedure Laterality Date  . COLONOSCOPY  09/2017  . CYSTOSCOPY N/A 09/29/2017   Procedure: CYSTOSCOPY;  Surgeon: Franchot Gallo, MD;  Location: [facility];  Service: Urology;  Laterality: N/A;  no seeds in bladder per Dr Diona Fanti  . INTERCOSTAL NERVE BLOCK  06/14/2018   Procedure: Intercostal Nerve Block;  Surgeon: [name], MD;  Location: [place];  Service: Thoracic;;  . KNEE ARTHROSCOPY Left   . LIPOMA EXCISION     multiple  . LYMPH NODE DISSECTION  06/14/2018   Procedure: Lymph Node Dissection;  Surgeon: [name], MD;  Location: [place];  Service: Thoracic;;  . RADIOACTIVE SEED  IMPLANT N/A 09/29/2017   Procedure: RADIOACTIVE SEED IMPLANT/BRACHYTHERAPY IMPLANT;  Surgeon: Franchot Gallo, MD;  Location: [facility];  Service: Urology;  Laterality: N/A;  63 seeds  . SHOULDER ARTHROSCOPY Right   . SPACE OAR INSTILLATION N/A 09/29/2017   Procedure: SPACE OAR INSTILLATION;  Surgeon: Franchot Gallo, MD;  Location: [facility];  Service: Urology;  Laterality: N/A;  . TONSILLECTOMY    . TOTAL HIP ARTHROPLASTY Right 05/23/2012   Procedure: TOTAL HIP ARTHROPLASTY;  Surgeon: Johnny Bridge, MD;  Location: [facility];  Service: Orthopedics;  Laterality: Right;  . TOTAL HIP ARTHROPLASTY Left 08/07/2012   Procedure: TOTAL HIP ARTHROPLASTY- left ;  Surgeon: Johnny Bridge, MD;  Location: [facility];  Service: Orthopedics;  Laterality: Left;  Marland Kitchen VIDEO ASSISTED THORACOSCOPY (VATS)/WEDGE RESECTION Right 06/14/2018   Procedure: VIDEO ASSISTED THORACOSCOPY (VATS) WITH LUNG RESECTION OF SUPERIOR SEGMENT OF RIGHT LOWER LOBE;  Surgeon: [name], MD;  Location: [facility];  Service: Thoracic;  Laterality: Right;     Medications: Current Meds  Medication Sig  . acetaminophen (TYLENOL) 500 MG tablet Take 500-1,000 mg by mouth every 8 (eight) hours as needed for moderate pain or headache.  Marland Kitchen CALCIUM-MAGNESIUM-VITAMIN D PO Take 2 tablets by mouth daily.  . famotidine (PEPCID) 20 MG tablet Take 20 mg by mouth daily before breakfast.   . hydrochlorothiazide (MICROZIDE) 12.5 MG capsule Take 12.5 mg by mouth every other day.   Marland Kitchen leuprolide (LUPRON) 30 MG injection Inject 30 mg into the muscle every 4 (four) months. Last dose 09/28/18  . lisinopril (PRINIVIL,ZESTRIL) 20 MG tablet Take 20 mg by mouth every morning.  . Multiple Vitamin (MULTIVITAMIN WITH MINERALS) TABS tablet Take 1 tablet by mouth daily.  . Multiple Vitamins-Minerals (ZINC PO) Take 15 mg by mouth daily.   . naproxen sodium (ALEVE) 220 MG tablet Take 220 mg by mouth 2 (two) times daily as needed (heavy  activity (golf, exercise)).   Marland Kitchen phenylephrine (NEO-SYNEPHRINE) 1 % nasal spray Place 1 drop into both nostrils every 6 (six) hours as needed for congestion.  . senna (SENOKOT) 8.6 MG TABS tablet Take 1 tablet by mouth daily.  . tadalafil (ADCIRCA/CIALIS) 20 MG tablet Take 20 mg by mouth daily as needed for erectile dysfunction.   . vitamin C (ASCORBIC ACID) 500 MG tablet Take 500 mg by mouth daily.     Allergies: Allergies  Allergen Reactions  . Penicillins Anaphylaxis, Itching and Rash    Childhood allergy. Tolerated cefazolin 05/23/12. Did it involve swelling of the face/tongue/throat, SOB, or low BP? Yes Did it involve sudden or severe rash/hives, skin peeling, or any reaction on the inside of your mouth or nose? No Did you need  to seek medical attention at a hospital or doctor's office? Yes When did it last happen?childhood allergy If all above answers are "NO", may proceed with cephalosporin use.    . Cefuroxime Axetil Diarrhea  . Other     general anesthesia - severe constipation   . Pollen Extract Itching    Seasonal allergies.    Social History: The patient  reports that he quit smoking about 46 years ago. His smoking use included cigarettes. He has a 15.00 pack-year smoking history. He has never used smokeless tobacco. He reports current alcohol use of about 7.0 standard drinks of alcohol per week. He reports that he does not use drugs.   Family History: The patient's family history includes Cancer in his father and mother.   Review of Systems: Please see the history of present illness.   All other systems are reviewed and negative.   Physical Exam: VS:  BP (!) 142/84   Pulse (!) 57   Ht 5\' 9"  (1.753 m)   Wt 192 lb 9.6 oz (87.4 kg)   SpO2 100%   BMI 28.44 kg/m  .  BMI Body mass index is 28.44 kg/m.  Wt Readings from Last 3 Encounters:  03/12/19 192 lb 9.6 oz (87.4 kg)  02/13/19 191 lb 1.9 oz (86.7 kg)  01/18/19 190 lb (86.2 kg)    General: Pleasant.  Alert and in no acute distress.   Cardiac: Regular rate and rhythm. No murmurs, rubs, or gallops. No edema.  Respiratory:  Lungs are clear to auscultation bilaterally with normal work of breathing.  GI: Soft and nontender.  MS: No deformity or atrophy. Gait and ROM intact.  Skin: Warm and dry. Color is normal.  Neuro:  Strength and sensation are intact and no gross focal deficits noted.  Psych: Alert, appropriate and with normal affect.   LABORATORY DATA:  EKG:  EKG is not ordered today.   Lab Results  Component Value Date   WBC 6.7 02/13/2019   HGB 13.2 02/13/2019   HCT 38.6 02/13/2019   PLT 174 02/13/2019   GLUCOSE 94 02/13/2019   CHOL 254 (H) 02/13/2019   TRIG 95 02/13/2019   HDL 89 02/13/2019   LDLCALC 149 (H) 02/13/2019   ALT 20 02/13/2019   AST 19 02/13/2019   NA 139 02/13/2019   K 4.2 02/13/2019   CL 103 02/13/2019   CREATININE 0.82 02/13/2019   BUN 12 02/13/2019   CO2 21 02/13/2019   TSH 1.670 02/13/2019   INR 1.0 06/12/2018     BNP (last 3 results) No results for input(s): BNP in the last 8760 hours.  ProBNP (last 3 results) No results for input(s): PROBNP in the last 8760 hours.   Other Studies Reviewed Today:  ECHO IMPRESSIONS 02/2019  1. Left ventricular ejection fraction, by visual estimation, is 60 to  65%. The left ventricle has normal function. There is mildly increased  left ventricular hypertrophy.  2. Left ventricular diastolic parameters are consistent with Grade I  diastolic dysfunction (impaired relaxation).  3. The left ventricle has no regional wall motion abnormalities.  4. Global right ventricle has normal systolic function.The right  ventricular size is normal. No increase in right ventricular wall  thickness.  5. Left atrial size was moderately dilated.  6. Right atrial size was normal.  7. The mitral valve is normal in structure. No evidence of mitral valve  regurgitation. No evidence of mitral stenosis.  8. The  tricuspid valve is normal in structure.  9.  The tricuspid valve is normal in structure. Tricuspid valve  regurgitation is mild.  10. The aortic valve is normal in structure. Aortic valve regurgitation is  mild. Mild to moderate aortic valve sclerosis/calcification without any  evidence of aortic stenosis.  11. The pulmonic valve was normal in structure. Pulmonic valve  regurgitation is not visualized.  12. Mildly elevated pulmonary artery systolic pressure.  13. The tricuspid regurgitant velocity is 2.67 m/s, and with an assumed  right atrial pressure of 3 mmHg, the estimated right ventricular systolic  pressure is mildly elevated at 31.5 mmHg.  14. The inferior vena cava is normal in size with greater than 50%  respiratory variability, suggesting right atrial pressure of 3 mmHg.    Assessment/Plan:  1. HTN - much better - his readings from home are reviewed. Will leave him on his current regimen. Recheck BMET today. Still needs to continue with salt restiction.   2. Prior VATS for lung cancer from 06/2018 - stable - plan per EBG  3. HLD - does have higher HDL - he is wanting to try and get back to weight loss and more exercise - will plan to recheck on return.   4. Former smoker - no longer an issue  5. Prostate cancer - was on Lupron - this therapy has completed - he is hopeful that the side effects will disappear.   6. Cardiomegaly - most recent echo stable.   7. Aortic atherosclerosis - no active symptoms but needs aggressive CV risk factor modification. We talked about stress testing/coronary CT if his energy level/fatigue fails to improve over the next few months. He does not feel like these studies are needed at this time.   8. COVID-19 Education: The signs and symptoms of COVID-19 were discussed with the patient and how to seek care for testing (follow up with PCP or arrange E-visit).  The importance of social distancing, staying at home, hand hygiene and wearing a mask when  out in public were discussed today.  Current medicines are reviewed with the patient today.  The patient does not have concerns regarding medicines other than what has been noted above.  The following changes have been made:  See above.  Labs/ tests ordered today include:    Orders Placed This Encounter  Procedures  . Basic metabolic panel     Disposition:   FU with me in 4 months.   Patient is agreeable to this plan and will call if any problems develop in the interim.   SignedTruitt Merle, NP  03/12/2019 11:04 AM  [facility] [address] [place], [place]  [address] Phone: [phone] Fax: [phone]

## 2019-03-12 ENCOUNTER — Other Ambulatory Visit: Payer: Self-pay

## 2019-03-12 ENCOUNTER — Encounter: Payer: Self-pay | Admitting: Nurse Practitioner

## 2019-03-12 ENCOUNTER — Ambulatory Visit (INDEPENDENT_AMBULATORY_CARE_PROVIDER_SITE_OTHER): Payer: Medicare Other | Admitting: Nurse Practitioner

## 2019-03-12 VITALS — BP 142/84 | HR 57 | Ht 69.0 in | Wt 192.6 lb

## 2019-03-12 DIAGNOSIS — E7800 Pure hypercholesterolemia, unspecified: Secondary | ICD-10-CM

## 2019-03-12 DIAGNOSIS — Z7189 Other specified counseling: Secondary | ICD-10-CM | POA: Diagnosis not present

## 2019-03-12 DIAGNOSIS — I517 Cardiomegaly: Secondary | ICD-10-CM

## 2019-03-12 DIAGNOSIS — I10 Essential (primary) hypertension: Secondary | ICD-10-CM | POA: Diagnosis not present

## 2019-03-12 DIAGNOSIS — I700 Atherosclerosis of aorta: Secondary | ICD-10-CM | POA: Diagnosis not present

## 2019-03-12 LAB — BASIC METABOLIC PANEL
BUN/Creatinine Ratio: 15 (ref 10–24)
BUN: 13 mg/dL (ref 8–27)
CO2: 24 mmol/L (ref 20–29)
Calcium: 9.4 mg/dL (ref 8.6–10.2)
Chloride: 102 mmol/L (ref 96–106)
Creatinine, Ser: 0.86 mg/dL (ref 0.76–1.27)
GFR calc Af Amer: 97 mL/min/{1.73_m2} (ref >59)
GFR calc non Af Amer: 84 mL/min/{1.73_m2} (ref >59)
Glucose: 101 mg/dL — ABNORMAL HIGH (ref 65–99)
Potassium: 3.9 mmol/L (ref 3.5–5.2)
Sodium: 138 mmol/L (ref 134–144)

## 2019-03-12 NOTE — Patient Instructions (Addendum)
After Visit Summary:  We will be checking the following labs today - BMET  Medication Instructions:    Continue with your current medicines.    If you need a refill on your cardiac medications before your next appointment, please call your pharmacy.     Testing/Procedures To Be Arranged:  N/A  Follow-Up:   See me in 4 months.     At [facility], you and your health needs are our priority.  As part of our continuing mission to provide you with exceptional heart care, we have created designated Provider Care Teams.  These Care Teams include your primary Cardiologist (physician) and Advanced Practice Providers (APPs -  Physician Assistants and Nurse Practitioners) who all work together to provide you with the care you need, when you need it.  Special Instructions:  . Stay safe, stay home, wash your hands for at least 20 seconds and wear a mask when out in public.  . It was good to talk with you today.    Call the [place] office at [phone] if you have any questions, problems or concerns.

## 2019-03-22 ENCOUNTER — Other Ambulatory Visit: Payer: Self-pay | Admitting: Physician Assistant

## 2019-06-12 ENCOUNTER — Ambulatory Visit: Payer: Medicare Other | Admitting: Nurse Practitioner

## 2019-06-28 ENCOUNTER — Other Ambulatory Visit: Payer: Self-pay | Admitting: Cardiothoracic Surgery

## 2019-06-28 DIAGNOSIS — C3490 Malignant neoplasm of unspecified part of unspecified bronchus or lung: Secondary | ICD-10-CM

## 2019-07-03 NOTE — Progress Notes (Signed)
CARDIOLOGY OFFICE NOTE  Date:  07/10/2019    Frederick Hall Date of Birth: 07-28-1940 Medical Record #[id number]  PCP:  [name], MD  Cardiologist:  Servando Snare   Chief Complaint  Patient presents with  . Follow-up    History of Present Illness: Frederick Hall is a 79 y.o. male who presents today for a 4 month check.   I see his wife - Frederick Hall who is the daughter of Frederick Hall, my long time patientas well. He has asked to follow with me.   He has a history of prostate cancer, ground glass opacity in the right lower lung- s/p biopsy + for adenocarcinoma - s/p VATS with lung resection of the right lower lobe per EBG in 2020,past smoker, and HTN.   He came to see me back earlier this year for BP control. Former smoker. Daily alcohol. Has had HTN for over 30 years. Also with prostate cancer on Lupron. Very little DOE. BP not controlled over the past several years. Some lability and he was doing HCTZ sporadically due to some lower BP readings. Really likes salt - especially pretzels. We got an echo - changed his medicines and checked labs. Prior CT with aortic atherosclerosis and mild CM noted - needs aggressive CV risk factor modification. Last seen in early March - overall BP was ok. Still struggles with effects of his Lupron.   The patient does not have symptoms concerning for COVID-19 infection (fever, chills, cough, or new shortness of breath).   Comes in today. Here alone. Doing well. For the most part BP is great at home. He got mixed up a few times and took HCTZ 2 days in a row - that caused significant lowering of his BP - he would be dizzy. The QOD dosing works the best for him. No chest pain. Breathing is good. Still with some side effects of his Lupron that are slow to wear off. Needs lab today. Overall, he feels like he is doing ok - does wish he could lose some weight.    Past Medical History:  Diagnosis Date  . Arthritis   . Asthma 2005    "attack x 1"  . BCC (basal cell carcinoma of skin) 04/15/2008   Right Forehead  . BCC (basal cell carcinoma of skin) 08/26/2016   Mid Forehead  . BPH (benign prostatic hyperplasia)   . Erectile dysfunction   . GERD (gastroesophageal reflux disease)   . Headache    "Stabbing"  . Hematuria 12/2016  . History of acute prostatitis 12/2016  . Hypertension   . Lipoma 02/08/2014   Small subcutaneous mass right temporal region, noted on MRI Brain, pt unaware  . Nodular basal cell carcinoma (BCC) 07/20/2016   Mid Forehead  . Nodular basal cell carcinoma (BCC) 05/26/2017   Left Nare  . Nodule of right lung 08/26/2017   71mm ground glass posterior nodule, noted on CT chest  . Nodulo-ulcerative basal cell carcinoma (BCC) 10/21/2016   Right Shin  . Osteoarthritis of left hip 08/07/2012  . Osteoarthritis of right hip 05/23/2012  . Prostate cancer ([place])   . Renal cyst, right 06/27/2017   Large peripelvic, noted on NM Bone scan  . SCC (squamous cell carcinoma) 01/27/2009   Left Outer Brow - Well Diff  . Seasonal allergies   . Skin cancer   . Squamous cell carcinoma in situ (SCCIS) 07/27/2013   Left Forehead  . Superficial basal cell carcinoma (BCC) 10/14/2014   Tip  of Nose  . Wears hearing aid     Past Surgical History:  Procedure Laterality Date  . COLONOSCOPY  09/2017  . CYSTOSCOPY N/A 09/29/2017   Procedure: CYSTOSCOPY;  Surgeon: Franchot Gallo, MD;  Location[facility];  Service: Urology;  Laterality: N/A;  no seeds in bladder per Dr Diona Fanti  . INTERCOSTAL NERVE BLOCK  06/14/2018   Procedure: Intercostal Nerve Block;  Surgeon: [name], MD;  Location: [place];  Service: Thoracic;;  . KNEE ARTHROSCOPY Left   . LIPOMA EXCISION     multiple  . LYMPH NODE DISSECTION  06/14/2018   Procedure: Lymph Node Dissection;  Surgeon: [name], MD;  Location: [place];  Service: Thoracic;;  . RADIOACTIVE SEED IMPLANT N/A 09/29/2017   Procedure: RADIOACTIVE SEED  IMPLANT/BRACHYTHERAPY IMPLANT;  Surgeon: Franchot Gallo, MD;  Location[facility];  Service: Urology;  Laterality: N/A;  63 seeds  . SHOULDER ARTHROSCOPY Right   . SPACE OAR INSTILLATION N/A 09/29/2017   Procedure: SPACE OAR INSTILLATION;  Surgeon: Franchot Gallo, MD;  Location: [facility];  Service: Urology;  Laterality: N/A;  . TONSILLECTOMY    . TOTAL HIP ARTHROPLASTY Right 05/23/2012   Procedure: TOTAL HIP ARTHROPLASTY;  Surgeon: Johnny Bridge, MD;  Location: [facility];  Service: Orthopedics;  Laterality: Right;  . TOTAL HIP ARTHROPLASTY Left 08/07/2012   Procedure: TOTAL HIP ARTHROPLASTY- left ;  Surgeon: Johnny Bridge, MD;  Location: [place];  Service: Orthopedics;  Laterality: Left;  Marland Kitchen VIDEO ASSISTED THORACOSCOPY (VATS)/WEDGE RESECTION Right 06/14/2018   Procedure: VIDEO ASSISTED THORACOSCOPY (VATS) WITH LUNG RESECTION OF SUPERIOR SEGMENT OF RIGHT LOWER LOBE;  Surgeon: [name], MD;  Location: [place];  Service: Thoracic;  Laterality: Right;     Medications: Current Meds  Medication Sig  . acetaminophen (TYLENOL) 500 MG tablet Take 500-1,000 mg by mouth every 8 (eight) hours as needed for moderate pain or headache.  Marland Kitchen CALCIUM-MAGNESIUM-VITAMIN D PO Take 2 tablets by mouth daily.  . famotidine (PEPCID) 20 MG tablet Take 20 mg by mouth daily before breakfast.   . hydrochlorothiazide (MICROZIDE) 12.5 MG capsule Take 1 capsule (12.5 mg total) by mouth every other day.  Marland Kitchen leuprolide (LUPRON) 30 MG injection Inject 30 mg into the muscle every 4 (four) months. Last dose 09/28/18  . lisinopril (PRINIVIL,ZESTRIL) 20 MG tablet Take 20 mg by mouth every morning.  . Multiple Vitamin (MULTIVITAMIN WITH MINERALS) TABS tablet Take 1 tablet by mouth daily.  . Multiple Vitamins-Minerals (ZINC PO) Take 15 mg by mouth daily.   . naproxen sodium (ALEVE) 220 MG tablet Take 220 mg by mouth 2 (two) times daily as needed (heavy activity (golf, exercise)).   Marland Kitchen  phenylephrine (NEO-SYNEPHRINE) 1 % nasal spray Place 1 drop into both nostrils every 6 (six) hours as needed for congestion.  . senna (SENOKOT) 8.6 MG TABS tablet Take 1 tablet by mouth daily.  . tadalafil (ADCIRCA/CIALIS) 20 MG tablet Take 20 mg by mouth daily as needed for erectile dysfunction.   . vitamin C (ASCORBIC ACID) 500 MG tablet Take 500 mg by mouth daily.  . [DISCONTINUED] hydrochlorothiazide (MICROZIDE) 12.5 MG capsule Take 12.5 mg by mouth every other day.      Allergies: Allergies  Allergen Reactions  . Penicillins Anaphylaxis, Itching and Rash    Childhood allergy. Tolerated cefazolin 05/23/12. Did it involve swelling of the face/tongue/throat, SOB, or low BP? Yes Did it involve sudden or severe rash/hives, skin peeling, or  any reaction on the inside of your mouth or nose? No Did you need to seek medical attention at a hospital or doctor's office? Yes When did it last happen?childhood allergy If all above answers are "NO", may proceed with cephalosporin use.    . Cefuroxime Axetil Diarrhea  . Other     general anesthesia - severe constipation   . Pollen Extract Itching    Seasonal allergies.    Social History: The patient  reports that he quit smoking about 47 years ago. His smoking use included cigarettes. He has a 15.00 pack-year smoking history. He has never used smokeless tobacco. He reports current alcohol use of about 7.0 standard drinks of alcohol per week. He reports that he does not use drugs.   Family History: The patient's family history includes Cancer in his father and mother.   Review of Systems: Please see the history of present illness.   All other systems are reviewed and negative.   Physical Exam: VS:  BP 108/80   Pulse (!) 58   Ht 5\' 9"  (1.753 m)   Wt 194 lb 12.8 oz (88.4 kg)   SpO2 98%   BMI 28.77 kg/m  .  BMI Body mass index is 28.77 kg/m.  Wt Readings from Last 3 Encounters:  07/10/19 194 lb 12.8 oz (88.4 kg)  03/12/19 192 lb  9.6 oz (87.4 kg)  02/13/19 191 lb 1.9 oz (86.7 kg)    General: Alert and in no acute distress.   Cardiac: Regular rate and rhythm. No murmurs, rubs, or gallops. No edema.  Respiratory:  Lungs are clear to auscultation bilaterally with normal work of breathing.  GI: Soft and nontender.  MS: No deformity or atrophy. Gait and ROM intact.  Skin: Warm and dry. Color is normal.  Neuro:  Strength and sensation are intact and no gross focal deficits noted.  Psych: Alert, appropriate and with normal affect.   LABORATORY DATA:  EKG:  EKG is not ordered today.    Lab Results  Component Value Date   WBC 6.7 02/13/2019   HGB 13.2 02/13/2019   HCT 38.6 02/13/2019   PLT 174 02/13/2019   GLUCOSE 101 (H) 03/12/2019   CHOL 254 (H) 02/13/2019   TRIG 95 02/13/2019   HDL 89 02/13/2019   LDLCALC 149 (H) 02/13/2019   ALT 20 02/13/2019   AST 19 02/13/2019   NA 138 03/12/2019   K 3.9 03/12/2019   CL 102 03/12/2019   CREATININE 0.86 03/12/2019   BUN 13 03/12/2019   CO2 24 03/12/2019   TSH 1.670 02/13/2019   INR 1.0 06/12/2018     BNP (last 3 results) No results for input(s): BNP in the last 8760 hours.  ProBNP (last 3 results) No results for input(s): PROBNP in the last 8760 hours.   Other Studies Reviewed Today:  ECHO IMPRESSIONS 02/2019  1. Left ventricular ejection fraction, by visual estimation, is 60 to  65%. The left ventricle has normal function. There is mildly increased  left ventricular hypertrophy.  2. Left ventricular diastolic parameters are consistent with Grade I  diastolic dysfunction (impaired relaxation).  3. The left ventricle has no regional wall motion abnormalities.  4. Global right ventricle has normal systolic function.The right  ventricular size is normal. No increase in right ventricular wall  thickness.  5. Left atrial size was moderately dilated.  6. Right atrial size was normal.  7. The mitral valve is normal in structure. No evidence of mitral  valve  regurgitation.  No evidence of mitral stenosis.  8. The tricuspid valve is normal in structure.  9. The tricuspid valve is normal in structure. Tricuspid valve  regurgitation is mild.  10. The aortic valve is normal in structure. Aortic valve regurgitation is  mild. Mild to moderate aortic valve sclerosis/calcification without any  evidence of aortic stenosis.  11. The pulmonic valve was normal in structure. Pulmonic valve  regurgitation is not visualized.  12. Mildly elevated pulmonary artery systolic pressure.  13. The tricuspid regurgitant velocity is 2.67 m/s, and with an assumed  right atrial pressure of 3 mmHg, the estimated right ventricular systolic  pressure is mildly elevated at 31.5 mmHg.  14. The inferior vena cava is normal in size with greater than 50%  respiratory variability, suggesting right atrial pressure of 3 mmHg.    Assessment/Plan:  1. HTN - BP looks great - good control with his current regimen - does well with the QOD dosing of HCTZ. Lab today.   2. HLD - rechecking today - I have told him that lipid lowering is most likely going to be indicated.   3. Prior VATS for lung cancer per EBG.   4. Former smoker - resolved.   5. Prostate cancer - off Lupron but still with some side effects.   6. Aortic atherosclerosis - no active symptoms - needs aggressive CV risk factor modification - lab today and low threshold to start statin. He has no worrisome symptoms at this time reported - will follow.   7. Cardiomegaly - echo was stable.   Current medicines are reviewed with the patient today.  The patient does not have concerns regarding medicines other than what has been noted above.  The following changes have been made:  See above.  Labs/ tests ordered today include:    Orders Placed This Encounter  Procedures  . Basic metabolic panel  . Hepatic function panel  . Lipid panel     Disposition:   FU with me in 6 months.   Patient is  agreeable to this plan and will call if any problems develop in the interim.   SignedTruitt Merle, NP  07/10/2019 9:23 AM  [facility] [address] [place], [place]  [address] Phone: [phone] Fax: [phone]

## 2019-07-10 ENCOUNTER — Other Ambulatory Visit: Payer: Self-pay

## 2019-07-10 ENCOUNTER — Ambulatory Visit (INDEPENDENT_AMBULATORY_CARE_PROVIDER_SITE_OTHER): Payer: Medicare Other | Admitting: Nurse Practitioner

## 2019-07-10 ENCOUNTER — Encounter: Payer: Self-pay | Admitting: Nurse Practitioner

## 2019-07-10 VITALS — BP 108/80 | HR 58 | Ht 69.0 in | Wt 194.8 lb

## 2019-07-10 DIAGNOSIS — I700 Atherosclerosis of aorta: Secondary | ICD-10-CM | POA: Diagnosis not present

## 2019-07-10 DIAGNOSIS — E7800 Pure hypercholesterolemia, unspecified: Secondary | ICD-10-CM

## 2019-07-10 DIAGNOSIS — I10 Essential (primary) hypertension: Secondary | ICD-10-CM

## 2019-07-10 LAB — LIPID PANEL
Chol/HDL Ratio: 3.1 ratio (ref 0.0–5.0)
Cholesterol, Total: 268 mg/dL — ABNORMAL HIGH (ref 100–199)
HDL: 86 mg/dL (ref >39)
LDL Chol Calc (NIH): 168 mg/dL — ABNORMAL HIGH (ref 0–99)
Triglycerides: 87 mg/dL (ref 0–149)
VLDL Cholesterol Cal: 14 mg/dL (ref 5–40)

## 2019-07-10 LAB — HEPATIC FUNCTION PANEL
ALT: 17 IU/L (ref 0–44)
AST: 22 IU/L (ref 0–40)
Albumin: 4.4 g/dL (ref 3.7–4.7)
Alkaline Phosphatase: 103 IU/L (ref 48–121)
Bilirubin Total: 0.5 mg/dL (ref 0.0–1.2)
Bilirubin, Direct: 0.14 mg/dL (ref 0.00–0.40)
Total Protein: 6.6 g/dL (ref 6.0–8.5)

## 2019-07-10 LAB — BASIC METABOLIC PANEL
BUN/Creatinine Ratio: 19 (ref 10–24)
BUN: 16 mg/dL (ref 8–27)
CO2: 23 mmol/L (ref 20–29)
Calcium: 9.4 mg/dL (ref 8.6–10.2)
Chloride: 98 mmol/L (ref 96–106)
Creatinine, Ser: 0.85 mg/dL (ref 0.76–1.27)
GFR calc Af Amer: 97 mL/min/{1.73_m2} (ref >59)
GFR calc non Af Amer: 84 mL/min/{1.73_m2} (ref >59)
Glucose: 96 mg/dL (ref 65–99)
Potassium: 4.1 mmol/L (ref 3.5–5.2)
Sodium: 137 mmol/L (ref 134–144)

## 2019-07-10 MED ORDER — HYDROCHLOROTHIAZIDE 12.5 MG PO CAPS: 12.5000 mg | ORAL_CAPSULE | ORAL | 3 refills | Status: DC

## 2019-07-10 NOTE — Patient Instructions (Addendum)
After Visit Summary:  We will be checking the following labs today - BMET, Lipids and LFT   Medication Instructions:    Continue with your current medicines.   I refilled the HCTZ today for you   If you need a refill on your cardiac medications before your next appointment, please call your pharmacy.     Testing/Procedures To Be Arranged:  N/A  Follow-Up:   See me in 6 months    At [facility], you and your health needs are our priority.  As part of our continuing mission to provide you with exceptional heart care, we have created designated Provider Care Teams.  These Care Teams include your primary Cardiologist (physician) and Advanced Practice Providers (APPs -  Physician Assistants and Nurse Practitioners) who all work together to provide you with the care you need, when you need it.  Special Instructions:  . Stay safe, wash your hands for at least 20 seconds and wear a mask when needed.  . It was good to talk with you today.    Call the [place] office at [phone] if you have any questions, problems or concerns.

## 2019-07-11 ENCOUNTER — Other Ambulatory Visit: Payer: Self-pay | Admitting: *Deleted

## 2019-07-11 DIAGNOSIS — E7800 Pure hypercholesterolemia, unspecified: Secondary | ICD-10-CM

## 2019-07-11 MED ORDER — ATORVASTATIN CALCIUM 10 MG PO TABS: 10.0000 mg | ORAL_TABLET | Freq: Every day | ORAL | 3 refills | Status: DC

## 2019-08-09 ENCOUNTER — Other Ambulatory Visit: Payer: Self-pay

## 2019-08-09 ENCOUNTER — Ambulatory Visit (INDEPENDENT_AMBULATORY_CARE_PROVIDER_SITE_OTHER): Payer: Medicare Other | Admitting: Cardiothoracic Surgery

## 2019-08-09 ENCOUNTER — Ambulatory Visit
Admission: RE | Admit: 2019-08-09 | Discharge: 2019-08-09 | Disposition: A | Discharge status: Home / Self Care | Payer: Medicare Other | Source: Ambulatory Visit | Attending: Cardiothoracic Surgery | Admitting: Cardiothoracic Surgery

## 2019-08-09 VITALS — BP 125/80 | HR 52 | Temp 97.7°F | Resp 20 | Ht 69.0 in | Wt 192.0 lb

## 2019-08-09 DIAGNOSIS — Z85118 Personal history of other malignant neoplasm of bronchus and lung: Secondary | ICD-10-CM | POA: Diagnosis not present

## 2019-08-09 DIAGNOSIS — Z902 Acquired absence of lung [part of]: Secondary | ICD-10-CM

## 2019-08-09 DIAGNOSIS — C3490 Malignant neoplasm of unspecified part of unspecified bronchus or lung: Secondary | ICD-10-CM

## 2019-08-09 NOTE — Progress Notes (Signed)
[address]       [place] [address]             [phone]      Frederick Hall Medical Record #[id number] Date of Birth: 08/18/1940  Referring: Franchot Gallo, MD Primary Care: [name], MD Primary Cardiologist: No primary care provider on file.   Chief Complaint:   POST OP FOLLOW UP OPERATIVE REPORT DATE OF PROCEDURE:  06/14/2018 PREOPERATIVE DIAGNOSIS:  Adenocarcinoma, superior segment right lower lobe. POSTOPERATIVE DIAGNOSIS:  Adenocarcinoma, superior segment right lower lobe. SURGICAL PROCEDURE:  Right video-assisted thoracoscopy with right lower lobe segmentectomy and lymph node dissection and intercostal nerve block. SURGEON:  Lanelle Bal, MD  Cancer Staging Lung cancer, right lower lobe [facility]) Staging form: Lung, AJCC 8th Edition - Pathologic stage from 06/16/2018: Stage IA1 (pT1a, pN0, cM0) - Signed by [name], MD on 06/16/2018  History of Present Illness:     Patient returns to the office today now approximately 1 year follow-up after right lower lobe segmentectomy and lymph node dissection June 14, 2018 For adenocarcinoma of the lung.  Patient denies any signs and symptoms of Covid infection.. Patient notes that his been active walking up to 4 miles a day.   Past Medical History:  Diagnosis Date  . Arthritis   . Asthma 2005   "attack x 1"  . BCC (basal cell carcinoma of skin) 04/15/2008   Right Forehead  . BCC (basal cell carcinoma of skin) 08/26/2016   Mid Forehead  . BPH (benign prostatic hyperplasia)   . Erectile dysfunction   . GERD (gastroesophageal reflux disease)   . Headache    "Stabbing"  . Hematuria 12/2016  . History of acute prostatitis 12/2016  . Hypertension   . Lipoma 02/08/2014   Small subcutaneous mass right temporal region, noted on MRI Brain, pt unaware  . Nodular basal cell carcinoma (BCC) 07/20/2016   Mid Forehead  . Nodular basal cell carcinoma (BCC) 05/26/2017    Left Nare  . Nodule of right lung 08/26/2017   46mm ground glass posterior nodule, noted on CT chest  . Nodulo-ulcerative basal cell carcinoma (BCC) 10/21/2016   Right Shin  . Osteoarthritis of left hip 08/07/2012  . Osteoarthritis of right hip 05/23/2012  . Prostate cancer ([place])   . Renal cyst, right 06/27/2017   Large peripelvic, noted on NM Bone scan  . SCC (squamous cell carcinoma) 01/27/2009   Left Outer Brow - Well Diff  . Seasonal allergies   . Skin cancer   . Squamous cell carcinoma in situ (SCCIS) 07/27/2013   Left Forehead  . Superficial basal cell carcinoma (BCC) 10/14/2014   Tip of Nose  . Wears hearing aid      Social History   Tobacco Use  Smoking Status Former Smoker  . Packs/day: 1.00  . Years: 15.00  . Pack years: 15.00  . Types: Cigarettes  . Quit date: 05/18/1972  . Years since quitting: 47.2  Smokeless Tobacco Never Used  Tobacco Comment   social alcohol    Social History   Substance and Sexual Activity  Alcohol Use Yes  . Alcohol/week: 7.0 standard drinks  . Types: 7 Standard drinks or equivalent per week   Comment: 3  wine or beer     Allergies  Allergen Reactions  . Penicillins Anaphylaxis, Itching and Rash    Childhood allergy. Tolerated cefazolin 05/23/12. Did it involve swelling of the face/tongue/throat, SOB, or low BP?  Yes Did it involve sudden or severe rash/hives, skin peeling, or any reaction on the inside of your mouth or nose? No Did you need to seek medical attention at a hospital or doctor's office? Yes When did it last happen?childhood allergy If all above answers are "NO", may proceed with cephalosporin use.    . Cefuroxime Axetil Diarrhea  . Other     general anesthesia - severe constipation   . Pollen Extract Itching    Seasonal allergies.    Current Outpatient Medications  Medication Sig Dispense Refill  . acetaminophen (TYLENOL) 500 MG tablet Take 500-1,000 mg by mouth every 8 (eight) hours as needed for  moderate pain or headache.    Marland Kitchen atorvastatin (LIPITOR) 10 MG tablet Take 1 tablet (10 mg total) by mouth daily. 90 tablet 3  . CALCIUM-MAGNESIUM-VITAMIN D PO Take 2 tablets by mouth daily.    . famotidine (PEPCID) 20 MG tablet Take 20 mg by mouth daily before breakfast.     . hydrochlorothiazide (MICROZIDE) 12.5 MG capsule Take 1 capsule (12.5 mg total) by mouth every other day. 45 capsule 3  . leuprolide (LUPRON) 30 MG injection Inject 30 mg into the muscle every 4 (four) months. Last dose 09/28/18    . lisinopril (PRINIVIL,ZESTRIL) 20 MG tablet Take 20 mg by mouth every morning.    . Multiple Vitamin (MULTIVITAMIN WITH MINERALS) TABS tablet Take 1 tablet by mouth daily.    . Multiple Vitamins-Minerals (ZINC PO) Take 15 mg by mouth daily.     . naproxen sodium (ALEVE) 220 MG tablet Take 220 mg by mouth 2 (two) times daily as needed (heavy activity (golf, exercise)).     Marland Kitchen phenylephrine (NEO-SYNEPHRINE) 1 % nasal spray Place 1 drop into both nostrils every 6 (six) hours as needed for congestion.    . senna (SENOKOT) 8.6 MG TABS tablet Take 1 tablet by mouth daily.    . tadalafil (ADCIRCA/CIALIS) 20 MG tablet Take 20 mg by mouth daily as needed for erectile dysfunction.     . vitamin C (ASCORBIC ACID) 500 MG tablet Take 500 mg by mouth daily.     No current facility-administered medications for this visit.       Physical Exam: BP 125/80   Pulse 52   Temp 97.7 F (36.5 C) (Skin)   Resp 20   Ht 5\' 9"  (1.753 m)   Wt 192 lb (87.1 kg)   SpO2 97% Comment: RA  BMI 28.35 kg/m  General appearance: alert, cooperative and no distress Head: Normocephalic, without obvious abnormality, atraumatic Neck: no adenopathy, no carotid bruit, no JVD, supple, symmetrical, trachea midline and thyroid not enlarged, symmetric, no tenderness/mass/nodules Lymph nodes: Cervical, supraclavicular, and axillary nodes normal. Resp: clear to auscultation bilaterally Cardio: regular rate and rhythm, S1, S2 normal,  no murmur, click, rub or gallop GI: soft, non-tender; bowel sounds normal; no masses,  no organomegaly Extremities: extremities normal, atraumatic, no cyanosis or edema and Homans sign is negative, no sign of DVT Neurologic: Grossly normal Right port and incision sites are well-healed  Diagnostic Studies & Laboratory data:     Recent Radiology Findings:  CT Chest Wo Contrast  Result Date: 08/09/2019 CLINICAL DATA:  Right-sided Vats secondary to non-small cell lung cancer. History of prostate cancer with radiation therapy. Ex-smoker, quitting 46 years ago. EXAM: CT CHEST WITHOUT CONTRAST TECHNIQUE: Multidetector CT imaging of the chest was performed following the standard protocol without IV contrast. COMPARISON:  01/18/2019 FINDINGS: Cardiovascular: Aortic atherosclerosis. Tortuous thoracic aorta. Mild  cardiomegaly, without pericardial effusion. Mediastinum/Nodes: No supraclavicular adenopathy. No mediastinal or definite hilar adenopathy, given limitations of unenhanced CT. Lungs/Pleura: No pleural fluid. Probable secretions in the left side of the trachea. Right lower lobe wedge resection. No findings of locally recurrent disease. Subpleural nodularity at the right lung base is similar, including at maximally 5 mm on 114/8. There is also a nodule along the right minor fissure at 2 mm which is unchanged. Upper Abdomen: Normal imaged portions of the liver, spleen, stomach, pancreas, gallbladder, adrenal glands, left kidney. Low-density right renal lesion is incompletely imaged but likely represents a cyst. Musculoskeletal: Diffuse idiopathic skeletal hyperostosis. IMPRESSION: 1. Status post right lower lobe wedge resection, without recurrent or metastatic disease. 2. Aortic Atherosclerosis (ICD10-I70.0). Electronically Signed   By: Abigail Miyamoto M.D.   On: 08/09/2019 15:04   I have independently reviewed the above radiology studies  and reviewed the findings with the patient.    CT CHEST WO  CONTRAST  Result Date: 01/18/2019 CLINICAL DATA:  Non-small-cell lung cancer. Chest tightness at surgical site. right lung surgery 6/20. Remote smoking history. EXAM: CT CHEST WITHOUT CONTRAST TECHNIQUE: Multidetector CT imaging of the chest was performed following the standard protocol without IV contrast. COMPARISON:  09/14/2018 chest radiograph.  Most recent CT 03/09/2018 FINDINGS: Cardiovascular: Aortic and branch vessel atherosclerosis. Tortuous thoracic aorta. Mild cardiomegaly, without pericardial effusion. Aortic valve calcification. Mediastinum/Nodes: No mediastinal or definite hilar adenopathy, given limitations of unenhanced CT. Lungs/Pleura: No pleural fluid. Nodularity along the right minor fissure is unchanged, likely due to subpleural lymph nodes. Scattered right lower lobe subpleural tiny pulmonary nodules are similar and also favored to represent subpleural lymph nodes. The largest measures 4 mm on 127/8. Right lower lobe wedge resection, without residual disease. 2 mm anterior left lower lobe subpleural pulmonary nodule is similar and favored to represent a subpleural lymph node. Upper Abdomen: Normal imaged portions of the liver, spleen, stomach, pancreas, gallbladder, adrenal glands, left kidney. Anterior interpolar right renal 3.0 cm incompletely imaged low-density lesion, likely a cyst. Musculoskeletal: Moderate thoracic spondylosis. IMPRESSION: 1. Interval right lower lobe wedge resection. No findings of residual/recurrent or metastatic disease. 2. Scattered tiny pulmonary nodules are similar, primarily favored to represent subpleural lymph nodes. 3.  Aortic Atherosclerosis (ICD10-I70.0). Electronically Signed   By: Abigail Miyamoto M.D.   On: 01/18/2019 10:47    Recent Lab Findings: Lab Results  Component Value Date   WBC 6.7 02/13/2019   HGB 13.2 02/13/2019   HCT 38.6 02/13/2019   PLT 174 02/13/2019   GLUCOSE 96 07/10/2019   CHOL 268 (H) 07/10/2019   TRIG 87 07/10/2019   HDL 86  07/10/2019   LDLCALC 168 (H) 07/10/2019   ALT 17 07/10/2019   AST 22 07/10/2019   NA 137 07/10/2019   K 4.1 07/10/2019   CL 98 07/10/2019   CREATININE 0.85 07/10/2019   BUN 16 07/10/2019   CO2 23 07/10/2019   TSH 1.670 02/13/2019   INR 1.0 06/12/2018      Assessment / Plan:   #1 follow-up CT scan without evidence of recurrent disease after resection stage Ia non-small cell cancer right lower lobe 1 year ago Continue follow-up CT scans every 6 months x2 and then yearly  We will plan to see the patient back in 6 months with repeat ct chest  Medication Changes: No orders of the defined types were placed in this encounter.     [name] MD      [address] ,[place] [address] Office [phone]   Beeper [phone]  08/09/2019 3:30 PM

## 2019-08-13 ENCOUNTER — Encounter: Payer: Self-pay | Admitting: Cardiothoracic Surgery

## 2019-08-23 ENCOUNTER — Other Ambulatory Visit: Payer: Self-pay

## 2019-08-23 ENCOUNTER — Other Ambulatory Visit: Payer: Medicare Other | Admitting: *Deleted

## 2019-08-23 DIAGNOSIS — E7800 Pure hypercholesterolemia, unspecified: Secondary | ICD-10-CM

## 2019-08-23 LAB — LIPID PANEL
Chol/HDL Ratio: 2.4 ratio (ref 0.0–5.0)
Cholesterol, Total: 181 mg/dL (ref 100–199)
HDL: 75 mg/dL (ref >39)
LDL Chol Calc (NIH): 96 mg/dL (ref 0–99)
Triglycerides: 50 mg/dL (ref 0–149)
VLDL Cholesterol Cal: 10 mg/dL (ref 5–40)

## 2019-08-23 LAB — HEPATIC FUNCTION PANEL
ALT: 24 IU/L (ref 0–44)
AST: 27 IU/L (ref 0–40)
Albumin: 4.5 g/dL (ref 3.7–4.7)
Alkaline Phosphatase: 87 IU/L (ref 48–121)
Bilirubin Total: 0.6 mg/dL (ref 0.0–1.2)
Bilirubin, Direct: 0.19 mg/dL (ref 0.00–0.40)
Total Protein: 6.2 g/dL (ref 6.0–8.5)

## 2019-08-28 ENCOUNTER — Telehealth: Payer: Self-pay | Admitting: Nurse Practitioner

## 2019-08-28 ENCOUNTER — Other Ambulatory Visit: Payer: Self-pay | Admitting: *Deleted

## 2019-08-28 MED ORDER — ATORVASTATIN CALCIUM 10 MG PO TABS
10.0000 mg | ORAL_TABLET | ORAL | 3 refills | Status: DC
Start: 2019-08-28 — End: 2019-10-01

## 2019-08-28 NOTE — Telephone Encounter (Signed)
Sent to triage in error.

## 2019-08-28 NOTE — Telephone Encounter (Signed)
Patient is returning call to discuss lab results from 08/23/19. Please call.

## 2019-10-01 ENCOUNTER — Other Ambulatory Visit: Payer: Self-pay | Admitting: *Deleted

## 2019-10-01 DIAGNOSIS — E7800 Pure hypercholesterolemia, unspecified: Secondary | ICD-10-CM

## 2019-10-01 MED ORDER — ROSUVASTATIN CALCIUM 5 MG PO TABS
5.0000 mg | ORAL_TABLET | ORAL | 3 refills | Status: DC
Start: 2019-10-01 — End: 2019-11-29

## 2019-10-01 NOTE — Telephone Encounter (Signed)
S/w pt is aware of recommendations.  Pt will D/C Lipitor, Pt will start crestor one tablet  By mouth ( 5mg ) X 3 days weekly.  Medication list updated, sent to requested pharmacy. Pt will come back on Nov 17 for repeat fasting lft/lipid.  Orders in and linked, appt made.  If pt cannot keep appt will send mychart message.

## 2019-11-28 ENCOUNTER — Other Ambulatory Visit: Payer: Medicare Other | Admitting: *Deleted

## 2019-11-28 ENCOUNTER — Other Ambulatory Visit: Payer: Self-pay

## 2019-11-28 DIAGNOSIS — E7800 Pure hypercholesterolemia, unspecified: Secondary | ICD-10-CM

## 2019-11-28 LAB — HEPATIC FUNCTION PANEL
ALT: 24 IU/L (ref 0–44)
AST: 24 IU/L (ref 0–40)
Albumin: 4.2 g/dL (ref 3.7–4.7)
Alkaline Phosphatase: 86 IU/L (ref 44–121)
Bilirubin Total: 0.6 mg/dL (ref 0.0–1.2)
Bilirubin, Direct: 0.16 mg/dL (ref 0.00–0.40)
Total Protein: 6.1 g/dL (ref 6.0–8.5)

## 2019-11-28 LAB — LIPID PANEL
Chol/HDL Ratio: 2.8 ratio (ref 0.0–5.0)
Cholesterol, Total: 224 mg/dL — ABNORMAL HIGH (ref 100–199)
HDL: 80 mg/dL (ref >39)
LDL Chol Calc (NIH): 134 mg/dL — ABNORMAL HIGH (ref 0–99)
Triglycerides: 57 mg/dL (ref 0–149)
VLDL Cholesterol Cal: 10 mg/dL (ref 5–40)

## 2019-11-29 ENCOUNTER — Other Ambulatory Visit: Payer: Self-pay | Admitting: *Deleted

## 2019-11-29 MED ORDER — ROSUVASTATIN CALCIUM 5 MG PO TABS: 5.0000 mg | ORAL_TABLET | ORAL | 3 refills | Status: DC

## 2019-11-29 MED ORDER — HYDROCHLOROTHIAZIDE 12.5 MG PO CAPS
12.5000 mg | ORAL_CAPSULE | ORAL | 0 refills | Status: DC
Start: 2019-11-30 — End: 2019-12-10

## 2019-12-09 DIAGNOSIS — E7800 Pure hypercholesterolemia, unspecified: Secondary | ICD-10-CM

## 2019-12-10 ENCOUNTER — Encounter: Payer: Self-pay | Admitting: *Deleted

## 2019-12-10 MED ORDER — HYDROCHLOROTHIAZIDE 12.5 MG PO CAPS: 12.5000 mg | ORAL_CAPSULE | Freq: Three times a day (TID) | ORAL | 0 refills | Status: DC | PRN

## 2019-12-10 MED ORDER — EZETIMIBE 10 MG PO TABS: 10.0000 mg | ORAL_TABLET | Freq: Every day | ORAL | 3 refills | Status: DC

## 2019-12-10 NOTE — Telephone Encounter (Signed)
S/w pt is aware of treatment plan.  Take one tablet by mouth ( 10 mg) daily.  # 90 sent to requested pharmacy. Will come in on Jan 25 for repeat fasting labs lft/lipid, appt made, orders in and linked. Medication list updated.  Lipitor and Crestor added to pt's intolerances.  Pt is aware to stay on HCTZ one tablet by mouth (12.5 mg) every other day.

## 2019-12-17 NOTE — Progress Notes (Signed)
CARDIOLOGY OFFICE NOTE  Date:  12/31/2019    Frederick Hall Date of Birth: 1940/04/18 Medical Record #[id number]  PCP:  [name], MD  Cardiologist:  Servando Snare    Chief Complaint  Patient presents with  . Follow-up    History of Present Illness: Frederick Hall is a 79 y.o. male who presents today for a follow up visit.  I see his wife - Frederick Hall who is the daughter of Frederick Hall, my long time patient as well. He had asked to follow with me.    He has a history of prostate cancer, ground glass opacity in the right lower lung - s/p biopsy + for adenocarcinoma - s/p VATS with lung resection of the right lower lobe per [facility] in 2020, past smoker, and HTN.    He came to see me back earlier this year for BP control. Former smoker. Daily alcohol. Has had HTN for over 30 years. Also with prostate cancer on Lupron. Very little DOE. BP not controlled over the past several years. Some lability and he was doing HCTZ sporadically due to some lower BP readings. Really likes salt - especially pretzels. We got an echo - changed his medicines and checked labs. Prior CT with aortic atherosclerosis and mild CM noted - needs aggressive CV risk factor modification.Struggles with effects of his Lupron. Last seen in [place] and he was doing well - on less HCTZ due to potential for dizziness.    Comes in today. Here alone. Notes more issues with floaters now - having some eye surgery soon - has already had one done and getting ready for the second. No chest pain. Now on Zetia and feels good. Planning to get back to walking and losing weight after the holiday. BP looks good. He is taking his HCTZ every other day.   Past Medical History:  Diagnosis Date  . Arthritis   . Asthma 2005   "attack x 1"  . BCC (basal cell carcinoma of skin) 04/15/2008   Right Forehead  . BCC (basal cell carcinoma of skin) 08/26/2016   Mid Forehead  . BPH (benign prostatic hyperplasia)   . Erectile dysfunction    . GERD (gastroesophageal reflux disease)   . Headache    "Stabbing"  . Hematuria 12/2016  . History of acute prostatitis 12/2016  . Hypertension   . Lipoma 02/08/2014   Small subcutaneous mass right temporal region, noted on MRI Brain, pt unaware  . Nodular basal cell carcinoma (BCC) 07/20/2016   Mid Forehead  . Nodular basal cell carcinoma (BCC) 05/26/2017   Left Nare  . Nodule of right lung 08/26/2017   12mm ground glass posterior nodule, noted on CT chest  . Nodulo-ulcerative basal cell carcinoma (BCC) 10/21/2016   Right Shin  . Osteoarthritis of left hip 08/07/2012  . Osteoarthritis of right hip 05/23/2012  . Prostate cancer ([place])   . Renal cyst, right 06/27/2017   Large peripelvic, noted on NM Bone scan  . SCC (squamous cell carcinoma) 01/27/2009   Left Outer Brow - Well Diff  . Seasonal allergies   . Skin cancer   . Squamous cell carcinoma in situ (SCCIS) 07/27/2013   Left Forehead  . Superficial basal cell carcinoma (BCC) 10/14/2014   Tip of Nose  . Wears hearing aid     Past Surgical History:  Procedure Laterality Date  . COLONOSCOPY  09/2017  . CYSTOSCOPY N/A 09/29/2017   Procedure: CYSTOSCOPY;  Surgeon: Franchot Gallo, MD;  Location:  [facility];  Service: Urology;  Laterality: N/A;  no seeds in bladder per Dr Diona Fanti  . INTERCOSTAL NERVE BLOCK  06/14/2018   Procedure: Intercostal Nerve Block;  Surgeon: [name], MD;  Location: [facility];  Service: Thoracic;;  . KNEE ARTHROSCOPY Left   . LIPOMA EXCISION     multiple  . LYMPH NODE DISSECTION  06/14/2018   Procedure: Lymph Node Dissection;  Surgeon: [name], MD;  Location: [facility];  Service: Thoracic;;  . RADIOACTIVE SEED IMPLANT N/A 09/29/2017   Procedure: RADIOACTIVE SEED IMPLANT/BRACHYTHERAPY IMPLANT;  Surgeon: Franchot Gallo, MD;  Location: [facility];  Service: Urology;  Laterality: N/A;  63 seeds  . SHOULDER ARTHROSCOPY Right   . SPACE OAR INSTILLATION N/A  09/29/2017   Procedure: SPACE OAR INSTILLATION;  Surgeon: Franchot Gallo, MD;  Location: [facility];  Service: Urology;  Laterality: N/A;  . TONSILLECTOMY    . TOTAL HIP ARTHROPLASTY Right 05/23/2012   Procedure: TOTAL HIP ARTHROPLASTY;  Surgeon: Johnny Bridge, MD;  Location: [facility];  Service: Orthopedics;  Laterality: Right;  . TOTAL HIP ARTHROPLASTY Left 08/07/2012   Procedure: TOTAL HIP ARTHROPLASTY- left ;  Surgeon: Johnny Bridge, MD;  Location: [facility];  Service: Orthopedics;  Laterality: Left;  Marland Kitchen VIDEO ASSISTED THORACOSCOPY (VATS)/WEDGE RESECTION Right 06/14/2018   Procedure: VIDEO ASSISTED THORACOSCOPY (VATS) WITH LUNG RESECTION OF SUPERIOR SEGMENT OF RIGHT LOWER LOBE;  Surgeon: [name], MD;  Location: [facility];  Service: Thoracic;  Laterality: Right;     Medications: Current Meds  Medication Sig  . acetaminophen (TYLENOL) 500 MG tablet Take 500-1,000 mg by mouth every 8 (eight) hours as needed for moderate pain or headache.  Marland Kitchen CALCIUM-MAGNESIUM-VITAMIN D PO Take 2 tablets by mouth daily.  Marland Kitchen ezetimibe (ZETIA) 10 MG tablet Take 1 tablet (10 mg total) by mouth daily.  . famotidine (PEPCID) 20 MG tablet Take 20 mg by mouth daily before breakfast.   . leuprolide (LUPRON) 30 MG injection Inject 30 mg into the muscle every 4 (four) months. Last dose 09/28/18  . lisinopril (PRINIVIL,ZESTRIL) 20 MG tablet Take 20 mg by mouth every morning.  . Multiple Vitamin (MULTIVITAMIN WITH MINERALS) TABS tablet Take 1 tablet by mouth daily.  . Multiple Vitamins-Minerals (ZINC PO) Take 15 mg by mouth daily.   . naproxen sodium (ALEVE) 220 MG tablet Take 220 mg by mouth 2 (two) times daily as needed (heavy activity (golf, exercise)).   Marland Kitchen phenylephrine (NEO-SYNEPHRINE) 1 % nasal spray Place 1 drop into both nostrils every 6 (six) hours as needed for congestion.  . senna (SENOKOT) 8.6 MG TABS tablet Take 1 tablet by mouth daily.  . tadalafil (ADCIRCA/CIALIS) 20 MG tablet Take 20 mg  by mouth daily as needed for erectile dysfunction.   . vitamin C (ASCORBIC ACID) 500 MG tablet Take 500 mg by mouth daily.  . [DISCONTINUED] hydrochlorothiazide (MICROZIDE) 12.5 MG capsule Take 1 capsule (12.5 mg total) by mouth every 8 (eight) hours as needed.     Allergies: Allergies  Allergen Reactions  . Penicillins Anaphylaxis, Itching and Rash    Childhood allergy. Tolerated cefazolin 05/23/12. Did it involve swelling of the face/tongue/throat, SOB, or low BP? Yes Did it involve sudden or severe rash/hives, skin peeling, or any reaction on the inside of your mouth or nose? No Did you need to seek medical attention at a hospital or doctor's office? Yes When did it last happen?childhood allergy If all above answers are "  NO", may proceed with cephalosporin use.    . Crestor [Rosuvastatin] Other (See Comments)    Myalgias   . Lipitor [Atorvastatin] Other (See Comments)    Myalgia    . Cefuroxime Axetil Diarrhea  . Other     general anesthesia - severe constipation   . Pollen Extract Itching    Seasonal allergies.    Social History: The patient  reports that he quit smoking about 47 years ago. His smoking use included cigarettes. He has a 15.00 pack-year smoking history. He has never used smokeless tobacco. He reports current alcohol use of about 7.0 standard drinks of alcohol per week. He reports that he does not use drugs.   Family History: The patient's family history includes Cancer in his father and mother.   Review of Systems: Please see the history of present illness.   All other systems are reviewed and negative.   Physical Exam: VS:  BP 134/84   Pulse 61   Ht 5\' 9"  (1.753 m)   Wt 199 lb 12.8 oz (90.6 kg)   SpO2 98%   BMI 29.51 kg/m  .  BMI Body mass index is 29.51 kg/m.  Wt Readings from Last 3 Encounters:  12/31/19 199 lb 12.8 oz (90.6 kg)  08/09/19 192 lb (87.1 kg)  07/10/19 194 lb 12.8 oz (88.4 kg)    General: Pleasant. Alert and in no acute  distress.   Cardiac: Regular rate and rhythm. No murmurs, rubs, or gallops. No edema.  Respiratory:  Lungs are clear - decreased on the right but with normal work of breathing.  GI: Soft and nontender.  MS: No deformity or atrophy. Gait and ROM intact.  Skin: Warm and dry. Color is normal.  Neuro:  Strength and sensation are intact and no gross focal deficits noted.  Psych: Alert, appropriate and with normal affect.   LABORATORY DATA:  EKG:  EKG is not ordered today.   Lab Results  Component Value Date   WBC 6.7 02/13/2019   HGB 13.2 02/13/2019   HCT 38.6 02/13/2019   PLT 174 02/13/2019   GLUCOSE 96 07/10/2019   CHOL 224 (H) 11/28/2019   TRIG 57 11/28/2019   HDL 80 11/28/2019   LDLCALC 134 (H) 11/28/2019   ALT 24 11/28/2019   AST 24 11/28/2019   NA 137 07/10/2019   K 4.1 07/10/2019   CL 98 07/10/2019   CREATININE 0.85 07/10/2019   BUN 16 07/10/2019   CO2 23 07/10/2019   TSH 1.670 02/13/2019   INR 1.0 06/12/2018     BNP (last 3 results) No results for input(s): BNP in the last 8760 hours.  ProBNP (last 3 results) No results for input(s): PROBNP in the last 8760 hours.   Other Studies Reviewed Today:  ECHO IMPRESSIONS 02/2019   1. Left ventricular ejection fraction, by visual estimation, is 60 to  65%. The left ventricle has normal function. There is mildly increased  left ventricular hypertrophy.   2. Left ventricular diastolic parameters are consistent with Grade I  diastolic dysfunction (impaired relaxation).   3. The left ventricle has no regional wall motion abnormalities.   4. Global right ventricle has normal systolic function.The right  ventricular size is normal. No increase in right ventricular wall  thickness.   5. Left atrial size was moderately dilated.   6. Right atrial size was normal.   7. The mitral valve is normal in structure. No evidence of mitral valve  regurgitation. No evidence of mitral stenosis.  8. The tricuspid valve is normal in  structure.   9. The tricuspid valve is normal in structure. Tricuspid valve  regurgitation is mild.  10. The aortic valve is normal in structure. Aortic valve regurgitation is  mild. Mild to moderate aortic valve sclerosis/calcification without any  evidence of aortic stenosis.  11. The pulmonic valve was normal in structure. Pulmonic valve  regurgitation is not visualized.  12. Mildly elevated pulmonary artery systolic pressure.  13. The tricuspid regurgitant velocity is 2.67 m/s, and with an assumed  right atrial pressure of 3 mmHg, the estimated right ventricular systolic  pressure is mildly elevated at 31.5 mmHg.  14. The inferior vena cava is normal in size with greater than 50%  respiratory variability, suggesting right atrial pressure of 3 mmHg.      Assessment/Plan:  1. HTN - BP is good - no changes made.   2. HLD - statin intolerant due to myalgias - now on Zetia - recheck lab next month  3. Prior R VATS for lung cancer per [facility] - sees him next month.   4. Former smoker  5. Prostate cancer - has been on Lupron with lingering side effects.   6. Aortic atherosclerosis - no active symptoms - needs aggressive CV risk factor modification.   7. Cardiomegaly - echo was stable.     Current medicines are reviewed with the patient today.  The patient does not have concerns regarding medicines other than what has been noted above.  The following changes have been made:  See above.  Labs/ tests ordered today include:   No orders of the defined types were placed in this encounter.    Disposition:   FU with Dr. Johney Frame in one year. He is aware that I am leaving in February.   Patient is agreeable to this plan and will call if any problems develop in the interim.   SignedTruitt Merle, NP  12/31/2019 9:02 AM  [facility] [address] [place], [place]  [address] Phone: [phone] Fax: [phone]

## 2019-12-24 ENCOUNTER — Other Ambulatory Visit: Payer: Self-pay | Admitting: Cardiothoracic Surgery

## 2019-12-24 DIAGNOSIS — C3490 Malignant neoplasm of unspecified part of unspecified bronchus or lung: Secondary | ICD-10-CM

## 2019-12-31 ENCOUNTER — Other Ambulatory Visit: Payer: Self-pay

## 2019-12-31 ENCOUNTER — Ambulatory Visit (INDEPENDENT_AMBULATORY_CARE_PROVIDER_SITE_OTHER): Payer: Medicare Other | Admitting: Nurse Practitioner

## 2019-12-31 ENCOUNTER — Encounter: Payer: Self-pay | Admitting: Nurse Practitioner

## 2019-12-31 VITALS — BP 134/84 | HR 61 | Ht 69.0 in | Wt 199.8 lb

## 2019-12-31 DIAGNOSIS — E7800 Pure hypercholesterolemia, unspecified: Secondary | ICD-10-CM

## 2019-12-31 DIAGNOSIS — I10 Essential (primary) hypertension: Secondary | ICD-10-CM | POA: Diagnosis not present

## 2019-12-31 DIAGNOSIS — I700 Atherosclerosis of aorta: Secondary | ICD-10-CM

## 2019-12-31 DIAGNOSIS — Z789 Other specified health status: Secondary | ICD-10-CM | POA: Diagnosis not present

## 2019-12-31 MED ORDER — HYDROCHLOROTHIAZIDE 12.5 MG PO CAPS: 12.5000 mg | ORAL_CAPSULE | ORAL | 0 refills | Status: DC

## 2019-12-31 NOTE — Patient Instructions (Signed)
After Visit Summary:  We will be checking the following labs today - NONE  Change fasting labs to January 27th - skip lunch   Medication Instructions:    Continue with your current medicines.    If you need a refill on your cardiac medications before your next appointment, please call your pharmacy.     Testing/Procedures To Be Arranged:  N/A  Follow-Up:   See Dr. Johney Frame in one year     At [facility], you and your health needs are our priority.  As part of our continuing mission to provide you with exceptional heart care, we have created designated Provider Care Teams.  These Care Teams include your primary Cardiologist (physician) and Advanced Practice Providers (APPs -  Physician Assistants and Nurse Practitioners) who all work together to provide you with the care you need, when you need it.  Special Instructions:  . Stay safe, wash your hands for at least 20 seconds and wear a mask when needed.  . It was good to talk with you today.    Call the [place] office at [phone] if you have any questions, problems or concerns.

## 2020-02-04 NOTE — Progress Notes (Signed)
[address]       [place] [address]             [phone]      [place] Spencer [place] Medical Record #[id number] Date of Birth: 1940-04-05  Referring: Franchot Gallo, MD Primary Care: [name], MD Primary Cardiologist: No primary care provider on file.   Chief Complaint:   POST OP FOLLOW UP OPERATIVE REPORT DATE OF PROCEDURE:  06/14/2018 PREOPERATIVE DIAGNOSIS:  Adenocarcinoma, superior segment right lower lobe. POSTOPERATIVE DIAGNOSIS:  Adenocarcinoma, superior segment right lower lobe. SURGICAL PROCEDURE:  Right video-assisted thoracoscopy with right lower lobe segmentectomy and lymph node dissection and intercostal nerve block. SURGEON:  Lanelle Bal, MD  Cancer Staging Lung cancer, right lower lobe [facility]) Staging form: Lung, AJCC 8th Edition - Pathologic stage from 06/16/2018: Stage IA1 (pT1a, pN0, cM0) - Signed by [name], MD on 06/16/2018  History of Present Illness:     Patient returns to the office today now approximately 18  Month  follow-up after right lower lobe segmentectomy and lymph node dissection June 14, 2018 For adenocarcinoma of the lung.  Patient denies any signs and symptoms of Covid infection.. Patient notes that his been active walking up to 4 miles a day.   Past Medical History:  Diagnosis Date  . Arthritis   . Asthma 2005   "attack x 1"  . BCC (basal cell carcinoma of skin) 04/15/2008   Right Forehead  . BCC (basal cell carcinoma of skin) 08/26/2016   Mid Forehead  . BPH (benign prostatic hyperplasia)   . Erectile dysfunction   . GERD (gastroesophageal reflux disease)   . Headache    "Stabbing"  . Hematuria 12/2016  . History of acute prostatitis 12/2016  . Hypertension   . Lipoma 02/08/2014   Small subcutaneous mass right temporal region, noted on MRI Brain, pt unaware  . Nodular basal cell carcinoma (BCC) 07/20/2016   Mid Forehead  . Nodular basal cell carcinoma (BCC) 05/26/2017    Left Nare  . Nodule of right lung 08/26/2017   45mm ground glass posterior nodule, noted on CT chest  . Nodulo-ulcerative basal cell carcinoma (BCC) 10/21/2016   Right Shin  . Osteoarthritis of left hip 08/07/2012  . Osteoarthritis of right hip 05/23/2012  . Prostate cancer ([place])   . Renal cyst, right 06/27/2017   Large peripelvic, noted on NM Bone scan  . SCC (squamous cell carcinoma) 01/27/2009   Left Outer Brow - Well Diff  . Seasonal allergies   . Skin cancer   . Squamous cell carcinoma in situ (SCCIS) 07/27/2013   Left Forehead  . Superficial basal cell carcinoma (BCC) 10/14/2014   Tip of Nose  . Wears hearing aid      Social History   Tobacco Use  Smoking Status Former Smoker  . Packs/day: 1.00  . Years: 15.00  . Pack years: 15.00  . Types: Cigarettes  . Quit date: 05/18/1972  . Years since quitting: 47.7  Smokeless Tobacco Never Used  Tobacco Comment   social alcohol    Social History   Substance and Sexual Activity  Alcohol Use Yes  . Alcohol/week: 7.0 standard drinks  . Types: 7 Standard drinks or equivalent per week   Comment: 3  wine or beer     Allergies  Allergen Reactions  . Penicillins Anaphylaxis, Itching and Rash    Childhood allergy. Tolerated cefazolin 05/23/12. Did it involve swelling of the face/tongue/throat, SOB, or  low BP? Yes Did it involve sudden or severe rash/hives, skin peeling, or any reaction on the inside of your mouth or nose? No Did you need to seek medical attention at a hospital or doctor's office? Yes When did it last happen?childhood allergy If all above answers are "NO", may proceed with cephalosporin use.    . Crestor [Rosuvastatin] Other (See Comments)    Myalgias   . Lipitor [Atorvastatin] Other (See Comments)    Myalgia    . Cefuroxime Axetil Diarrhea  . Other     general anesthesia - severe constipation   . Pollen Extract Itching    Seasonal allergies.    Current Outpatient Medications  Medication  Sig Dispense Refill  . acetaminophen (TYLENOL) 500 MG tablet Take 500-1,000 mg by mouth every 8 (eight) hours as needed for moderate pain or headache.    Marland Kitchen CALCIUM-MAGNESIUM-VITAMIN D PO Take 2 tablets by mouth daily.    Marland Kitchen ezetimibe (ZETIA) 10 MG tablet Take 1 tablet (10 mg total) by mouth daily. 90 tablet 3  . famotidine (PEPCID) 20 MG tablet Take 20 mg by mouth daily before breakfast.     . hydrochlorothiazide (MICROZIDE) 12.5 MG capsule Take 1 capsule (12.5 mg total) by mouth every other day. 15 capsule 0  . leuprolide (LUPRON) 30 MG injection Inject 30 mg into the muscle every 4 (four) months. Last dose 09/28/18    . lisinopril (PRINIVIL,ZESTRIL) 20 MG tablet Take 20 mg by mouth every morning.    . Multiple Vitamin (MULTIVITAMIN WITH MINERALS) TABS tablet Take 1 tablet by mouth daily.    . Multiple Vitamins-Minerals (ZINC PO) Take 15 mg by mouth daily.     . naproxen sodium (ALEVE) 220 MG tablet Take 220 mg by mouth 2 (two) times daily as needed (heavy activity (golf, exercise)).     Marland Kitchen phenylephrine (NEO-SYNEPHRINE) 1 % nasal spray Place 1 drop into both nostrils every 6 (six) hours as needed for congestion.    . senna (SENOKOT) 8.6 MG TABS tablet Take 1 tablet by mouth daily.    . tadalafil (ADCIRCA/CIALIS) 20 MG tablet Take 20 mg by mouth daily as needed for erectile dysfunction.     . vitamin C (ASCORBIC ACID) 500 MG tablet Take 500 mg by mouth daily.     No current facility-administered medications for this visit.       Physical Exam: BP (!) 145/80   Pulse 60   Temp 97.6 F (36.4 C) (Skin)   Resp 20   Ht 5\' 9"  (1.753 m)   Wt 200 lb (90.7 kg)   SpO2 98% Comment: RA  BMI 29.53 kg/m  General appearance: alert, cooperative and no distress Neck: no adenopathy, no carotid bruit, no JVD, supple, symmetrical, trachea midline and thyroid not enlarged, symmetric, no tenderness/mass/nodules Lymph nodes: Cervical, supraclavicular, and axillary nodes normal. Resp: clear to auscultation  bilaterally Cardio: regular rate and rhythm, S1, S2 normal, no murmur, click, rub or gallop Extremities: extremities normal, atraumatic, no cyanosis or edema and Homans sign is negative, no sign of DVT Neurologic: Grossly normal  Diagnostic Studies & Laboratory data:     Recent Radiology Findings:  CT Chest Wo Contrast  Result Date: 02/07/2020 CLINICAL DATA:  Non-small-cell lung cancer. Restaging. Status post superior segmentectomy right lower lobe. EXAM: CT CHEST WITHOUT CONTRAST TECHNIQUE: Multidetector CT imaging of the chest was performed following the standard protocol without IV contrast. COMPARISON:  08/09/2019 FINDINGS: Cardiovascular: The heart size is normal. No substantial pericardial effusion. Coronary artery  calcification is evident. Atherosclerotic calcification is noted in the wall of the thoracic aorta. Ascending thoracic aorta measures 4.0 cm diameter on today's exam. Mediastinum/Nodes: No mediastinal lymphadenopathy. No evidence for gross hilar lymphadenopathy although assessment is limited by the lack of intravenous contrast on today's study. The esophagus has normal imaging features. There is no axillary lymphadenopathy. Lungs/Pleura: Stable 7 mm nodular opacity along the inferior surface of the minor fissure (axial 81/8), seen to be quite plaque-like on sagittal image 46/6, likely scarring or subpleural node. Staple line in the right lung compatible with the history of previous surgery. 6 mm nodule anterior to the staple line is unchanged in the interval. Previously described subpleural nodularity in the posterior right costophrenic sulcus is similar at 6 mm today compared to 5 mm previously. No new suspicious pulmonary nodule or mass on today's exam. No focal airspace consolidation. Insert no effusions Upper Abdomen: Right renal cyst again noted, incompletely visualized. No adrenal nodule or mass. Musculoskeletal: No worrisome lytic or sclerotic osseous abnormality. IMPRESSION: 1.  Stable exam. No new or progressive interval findings. 2. Status right lower lobe superior segmentectomy. 3. Aortic Atherosclerosis (ICD10-I70.0). Electronically Signed   By: Misty Stanley M.D.   On: 02/07/2020 13:35   I have independently reviewed the above radiology studies  and reviewed the findings with the patient.    CT CHEST WO CONTRAST  Result Date: 01/18/2019 CLINICAL DATA:  Non-small-cell lung cancer. Chest tightness at surgical site. right lung surgery 6/20. Remote smoking history. EXAM: CT CHEST WITHOUT CONTRAST TECHNIQUE: Multidetector CT imaging of the chest was performed following the standard protocol without IV contrast. COMPARISON:  09/14/2018 chest radiograph.  Most recent CT 03/09/2018 FINDINGS: Cardiovascular: Aortic and branch vessel atherosclerosis. Tortuous thoracic aorta. Mild cardiomegaly, without pericardial effusion. Aortic valve calcification. Mediastinum/Nodes: No mediastinal or definite hilar adenopathy, given limitations of unenhanced CT. Lungs/Pleura: No pleural fluid. Nodularity along the right minor fissure is unchanged, likely due to subpleural lymph nodes. Scattered right lower lobe subpleural tiny pulmonary nodules are similar and also favored to represent subpleural lymph nodes. The largest measures 4 mm on 127/8. Right lower lobe wedge resection, without residual disease. 2 mm anterior left lower lobe subpleural pulmonary nodule is similar and favored to represent a subpleural lymph node. Upper Abdomen: Normal imaged portions of the liver, spleen, stomach, pancreas, gallbladder, adrenal glands, left kidney. Anterior interpolar right renal 3.0 cm incompletely imaged low-density lesion, likely a cyst. Musculoskeletal: Moderate thoracic spondylosis. IMPRESSION: 1. Interval right lower lobe wedge resection. No findings of residual/recurrent or metastatic disease. 2. Scattered tiny pulmonary nodules are similar, primarily favored to represent subpleural lymph nodes. 3.  Aortic  Atherosclerosis (ICD10-I70.0). Electronically Signed   By: Abigail Miyamoto M.D.   On: 01/18/2019 10:47    Recent Lab Findings: Lab Results  Component Value Date   WBC 6.7 02/13/2019   HGB 13.2 02/13/2019   HCT 38.6 02/13/2019   PLT 174 02/13/2019   GLUCOSE 96 07/10/2019   CHOL 213 (H) 02/07/2020   TRIG 65 02/07/2020   HDL 74 02/07/2020   LDLCALC 127 (H) 02/07/2020   ALT 19 02/07/2020   AST 23 02/07/2020   NA 137 07/10/2019   K 4.1 07/10/2019   CL 98 07/10/2019   CREATININE 0.85 07/10/2019   BUN 16 07/10/2019   CO2 23 07/10/2019   TSH 1.670 02/13/2019   INR 1.0 06/12/2018      Assessment / Plan:   Status post resection of adenocarcinoma of the lung superior segment right  lower lobe- Cancer Staging Lung cancer, right lower lobe ([place]) Staging form: Lung, AJCC 8th Edition - Pathologic stage from 06/16/2018: Stage IA1 (pT1a, pN0, cM0) - Signed by [name], MD on 06/16/2018   Continue follow-up CT scans every 6 months x2 and then yearly  We will plan to see the patient back in 6 months with repeat ct chest  Medication Changes: No orders of the defined types were placed in this encounter.     [name] MD      [address] [place] [address] Office [phone]   Beeper [phone]  02/10/2020 2:41 PM

## 2020-02-05 ENCOUNTER — Other Ambulatory Visit: Payer: Medicare Other

## 2020-02-07 ENCOUNTER — Other Ambulatory Visit: Payer: Self-pay

## 2020-02-07 ENCOUNTER — Ambulatory Visit
Admission: RE | Admit: 2020-02-07 | Discharge: 2020-02-07 | Disposition: A | Discharge status: Home / Self Care | Payer: Medicare Other | Source: Ambulatory Visit | Attending: Cardiothoracic Surgery | Admitting: Cardiothoracic Surgery

## 2020-02-07 ENCOUNTER — Ambulatory Visit (INDEPENDENT_AMBULATORY_CARE_PROVIDER_SITE_OTHER): Payer: Medicare Other | Admitting: Cardiothoracic Surgery

## 2020-02-07 ENCOUNTER — Other Ambulatory Visit: Payer: Medicare Other

## 2020-02-07 VITALS — BP 145/80 | HR 60 | Temp 97.6°F | Resp 20 | Ht 69.0 in | Wt 200.0 lb

## 2020-02-07 DIAGNOSIS — Z902 Acquired absence of lung [part of]: Secondary | ICD-10-CM | POA: Diagnosis not present

## 2020-02-07 DIAGNOSIS — Z85118 Personal history of other malignant neoplasm of bronchus and lung: Secondary | ICD-10-CM | POA: Diagnosis not present

## 2020-02-07 DIAGNOSIS — E7800 Pure hypercholesterolemia, unspecified: Secondary | ICD-10-CM

## 2020-02-07 DIAGNOSIS — C3490 Malignant neoplasm of unspecified part of unspecified bronchus or lung: Secondary | ICD-10-CM

## 2020-02-08 ENCOUNTER — Telehealth: Payer: Self-pay

## 2020-02-08 LAB — HEPATIC FUNCTION PANEL
ALT: 19 IU/L (ref 0–44)
AST: 23 IU/L (ref 0–40)
Albumin: 4 g/dL (ref 3.7–4.7)
Alkaline Phosphatase: 81 IU/L (ref 44–121)
Bilirubin Total: 0.4 mg/dL (ref 0.0–1.2)
Bilirubin, Direct: 0.14 mg/dL (ref 0.00–0.40)
Total Protein: 6.1 g/dL (ref 6.0–8.5)

## 2020-02-08 LAB — LIPID PANEL
Chol/HDL Ratio: 2.9 ratio (ref 0.0–5.0)
Cholesterol, Total: 213 mg/dL — ABNORMAL HIGH (ref 100–199)
HDL: 74 mg/dL (ref >39)
LDL Chol Calc (NIH): 127 mg/dL — ABNORMAL HIGH (ref 0–99)
Triglycerides: 65 mg/dL (ref 0–149)
VLDL Cholesterol Cal: 12 mg/dL (ref 5–40)

## 2020-02-08 NOTE — Telephone Encounter (Signed)
-----   Message from Burtis Junes, NP sent at 02/08/2020  7:33 AM EST ----- Ok to report. Labs are stable - his lipids are a little better with just Zetia - ok to continue this for now - keep working on diet/exercise, etc. - otherwise, would continue on current regimen.

## 2020-02-08 NOTE — Telephone Encounter (Signed)
The patient has been notified of the result and verbalized understanding.  All questions (if any) were answered. Wilma Flavin, RN 02/08/2020 8:12 AM

## 2020-02-26 IMAGING — DX PORTABLE CHEST - 1 VIEW
1 series · 1 of 1 positions shown · non-contrast
Comparison: Radiograph June 15, 2018.

CLINICAL DATA: Chest tube in place.

EXAM:
PORTABLE CHEST 1 VIEW

[chest]
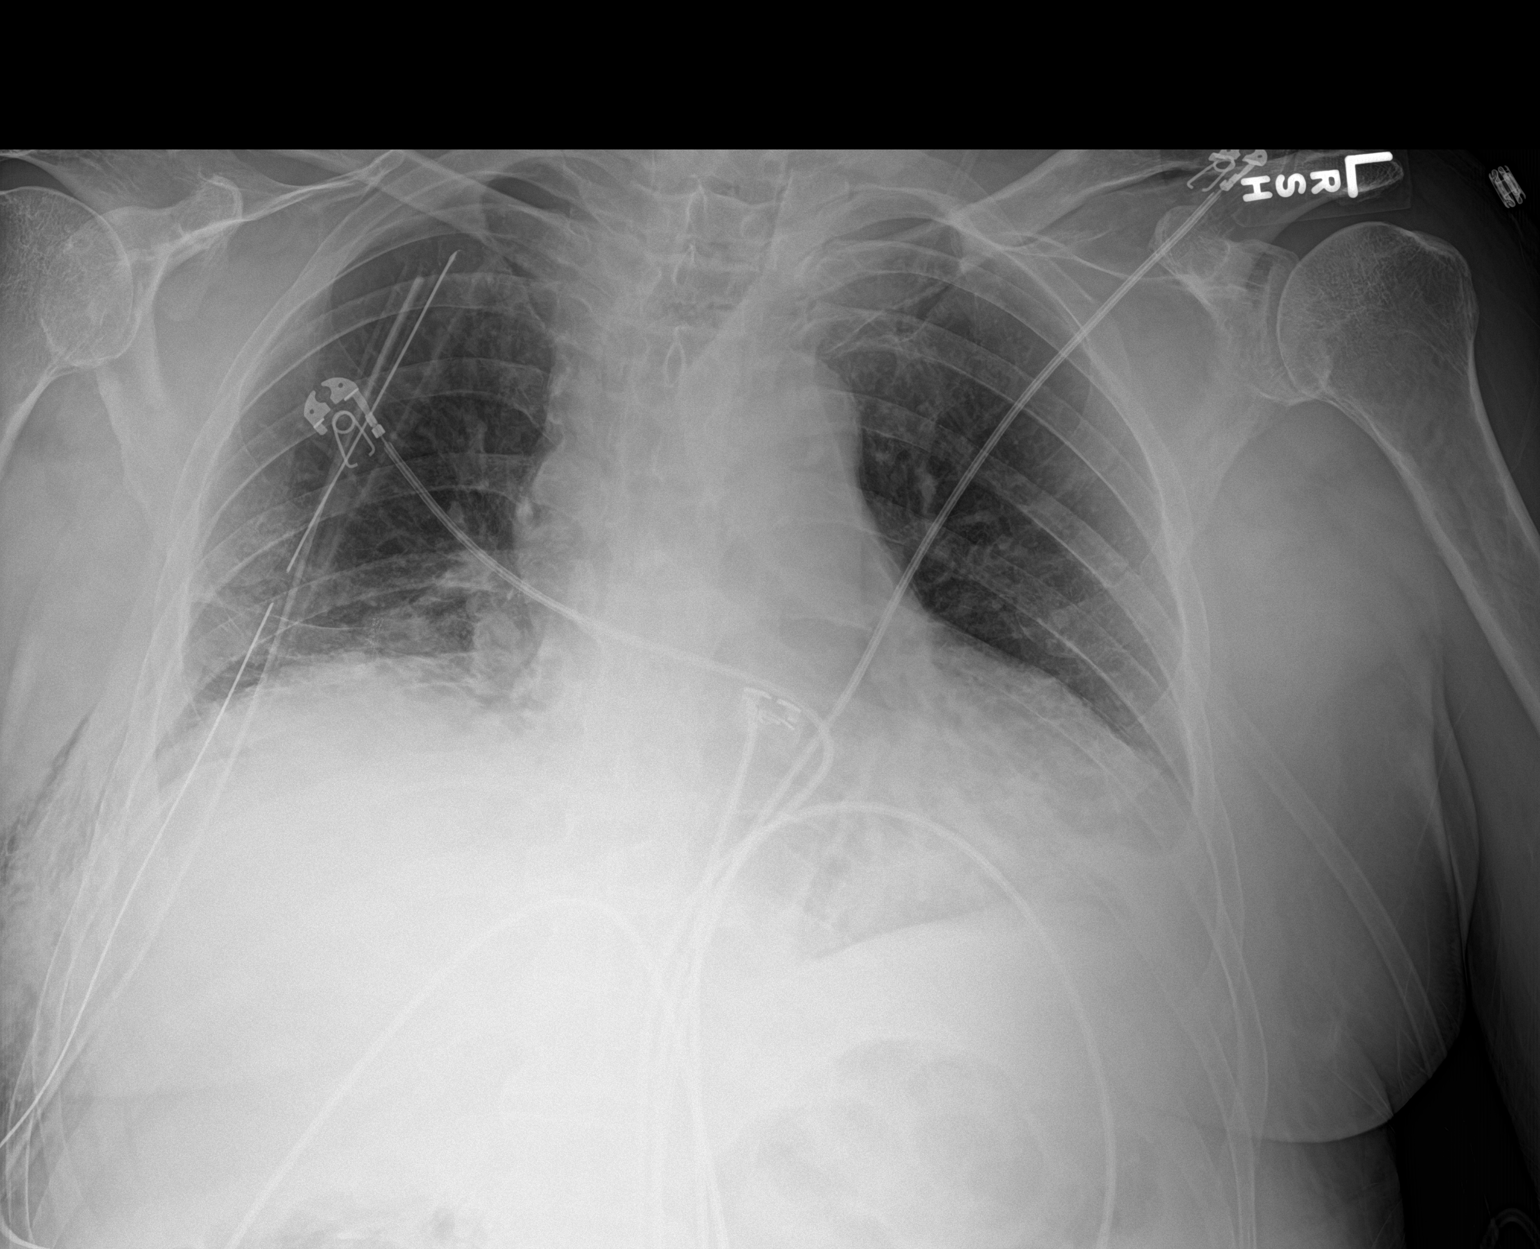

[1 of 1 positions shown; findings below may reference images not displayed]

FINDINGS: Stable cardiomediastinal silhouette. Two right-sided chest tubes are
unchanged in position. Minimal right apical pneumothorax is noted.
Stable bibasilar atelectasis is noted. Stable subcutaneous emphysema
is seen over right lateral chest wall. Bony thorax is unremarkable.
IMPRESSION: Stable position of 2 right-sided chest tubes. Minimal right apical
pneumothorax is noted. Stable bibasilar atelectasis.

## 2020-02-29 IMAGING — CR CHEST - 2 VIEW
2 series · 2 of 2 positions shown · non-contrast
Comparison: June 18, 2018

CLINICAL DATA: Pneumothorax.

EXAM:
CHEST - 2 VIEW

[chest pa]
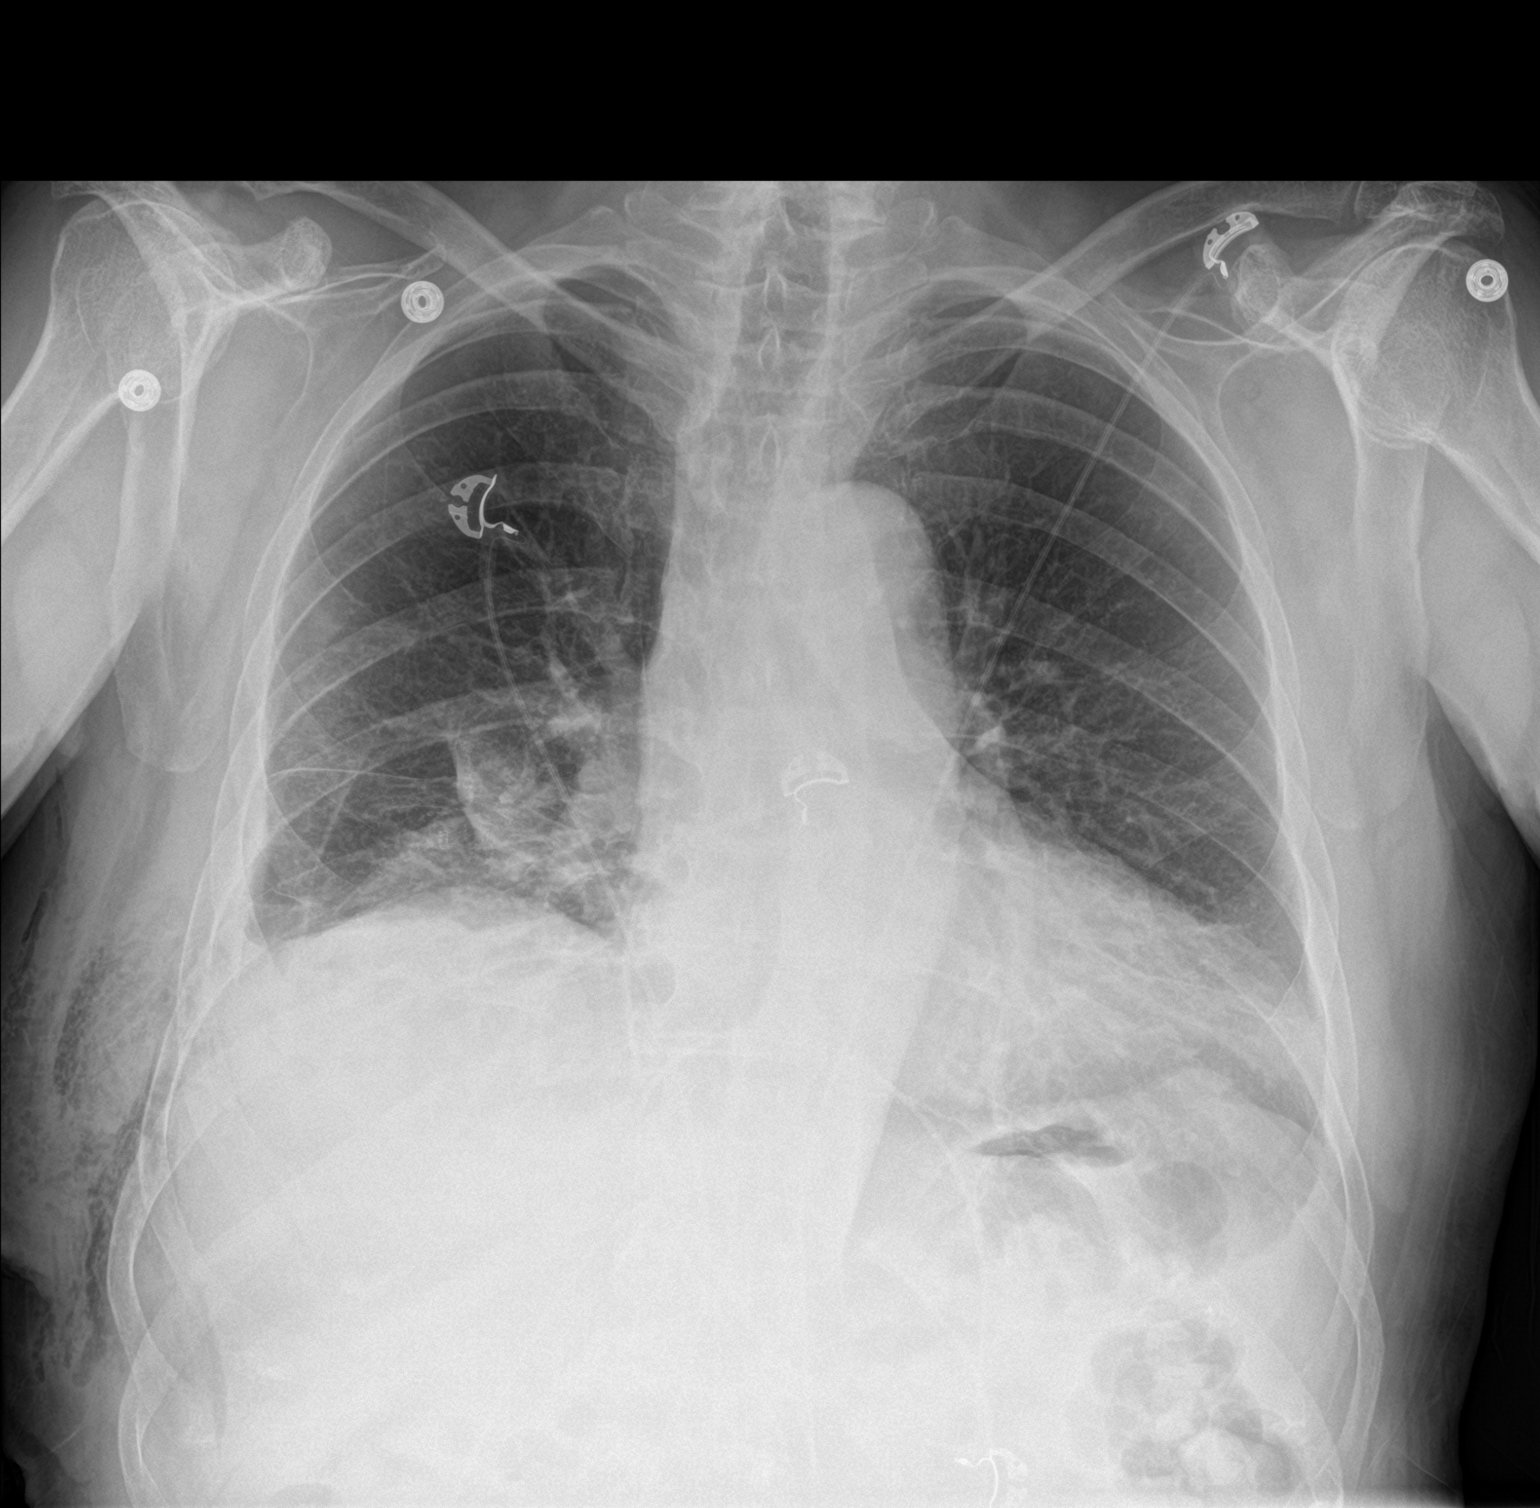

[chest lat]
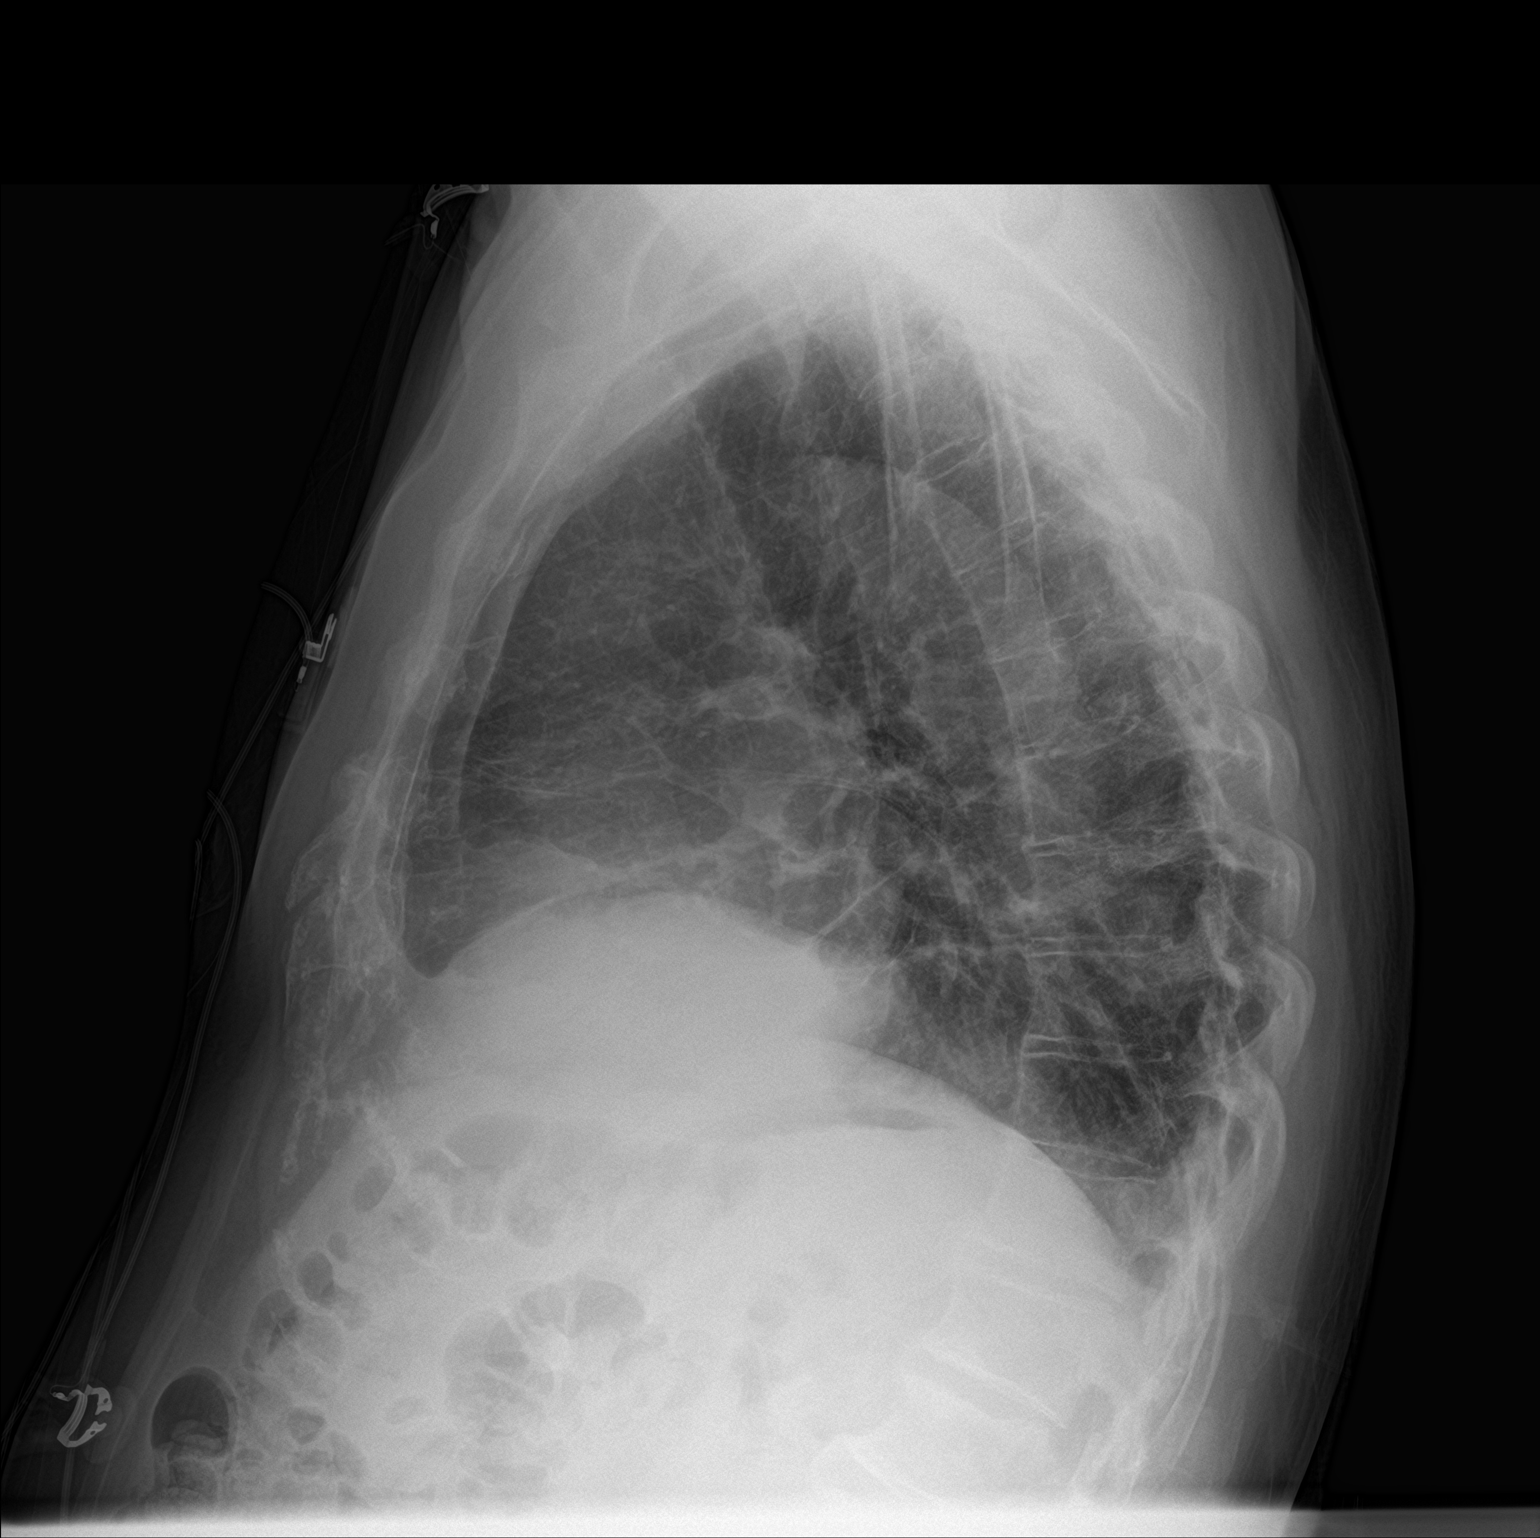

[2 of 2 positions shown; findings below may reference images not displayed]

FINDINGS: The previously noted small pneumothorax on the right with apical and
lateral basilar components appear stable. No tension component. Soft
tissue air remains on the right. There is a small right pleural
effusion. There is bibasilar atelectasis. There is no frank airspace
consolidation. The heart is upper normal in size with pulmonary
vascularity normal. No adenopathy. Areas of degenerative change
noted in the thoracic spine.
IMPRESSION: Small right-sided pneumothorax appears stable without tension
component. There is persistent subcutaneous air on the right,
primarily inferolateral in location. Bibasilar atelectasis with
rather minimal right pleural effusion. Lungs elsewhere clear. Stable
cardiac silhouette.

## 2020-06-23 ENCOUNTER — Other Ambulatory Visit: Payer: Self-pay | Admitting: Thoracic Surgery (Cardiothoracic Vascular Surgery)

## 2020-06-23 DIAGNOSIS — C3490 Malignant neoplasm of unspecified part of unspecified bronchus or lung: Secondary | ICD-10-CM

## 2020-06-23 DIAGNOSIS — C3430 Malignant neoplasm of lower lobe, unspecified bronchus or lung: Secondary | ICD-10-CM

## 2020-07-25 ENCOUNTER — Ambulatory Visit: Payer: Medicare Other

## 2020-07-25 ENCOUNTER — Ambulatory Visit
Admission: RE | Admit: 2020-07-25 | Discharge: 2020-07-25 | Disposition: A | Discharge status: Home / Self Care | Payer: Medicare Other | Source: Ambulatory Visit | Attending: Thoracic Surgery (Cardiothoracic Vascular Surgery) | Admitting: Thoracic Surgery (Cardiothoracic Vascular Surgery)

## 2020-07-25 DIAGNOSIS — C3490 Malignant neoplasm of unspecified part of unspecified bronchus or lung: Secondary | ICD-10-CM

## 2020-07-28 ENCOUNTER — Other Ambulatory Visit: Payer: Self-pay

## 2020-07-28 ENCOUNTER — Telehealth (INDEPENDENT_AMBULATORY_CARE_PROVIDER_SITE_OTHER): Payer: Medicare Other | Admitting: Thoracic Surgery (Cardiothoracic Vascular Surgery)

## 2020-07-28 DIAGNOSIS — C3430 Malignant neoplasm of lower lobe, unspecified bronchus or lung: Secondary | ICD-10-CM

## 2020-07-28 NOTE — Progress Notes (Signed)
     [address]       ,[place] [address]             [phone]       Patient: Home Provider: Office Consent for Telemedicine visit obtained.  Today's visit was completed via a real-time telehealth (see specific modality noted below). The patient/authorized person provided oral consent at the time of the visit to engage in a telemedicine encounter with the present provider at [facility]. The patient/authorized person was informed of the potential benefits, limitations, and risks of telemedicine. The patient/authorized person expressed understanding that the laws that protect confidentiality also apply to telemedicine. The patient/authorized person acknowledged understanding that telemedicine does not provide emergency services and that he or she would need to call 911 or proceed to the nearest hospital for help if such a need arose.   Total time spent in the clinical discussion 10 minutes.  Telehealth Modality: Phone visit (audio only)  I had a telephone visit with Mr. Eidem.  Overall he is doing well.  He denies any issues.  He was able to walk 3-1/2 miles a day without any significant shortness of breath or chest pain.  We reviewed the cross-sectional imaging, and for the most part everything is stable.  He does have some right-sided nodules but those have been stable over the last 6 months.  He originally underwent a wedge resection for stage I lung cancer in June 2020.  He will follow-up in 1 year with another CT scan.  Kennia Vanvorst Bary Leriche

## 2020-07-29 ENCOUNTER — Telehealth: Payer: Medicare Other | Admitting: Thoracic Surgery (Cardiothoracic Vascular Surgery)

## 2020-07-29 ENCOUNTER — Ambulatory Visit: Payer: Medicare Other

## 2020-08-04 ENCOUNTER — Telehealth: Payer: Self-pay | Admitting: *Deleted

## 2020-08-04 NOTE — Telephone Encounter (Signed)
Fasting CMET, Lipids 1 week before f/u with me in Dec 2022. [name], PA-C    08/04/2020 2:42 PM

## 2020-08-04 NOTE — Telephone Encounter (Signed)
S/w pt is old [place] pt.  Pt stated since starting zetia only had fasting labs one time in January.  Stated will call pt back next week after [name], PA-C looks at pt's chart. Pt had recall in system for Dr. Johney Frame in December, made appt for pt to see Nicki Reaper in December. Will send to [facility] to advise.

## 2020-08-05 ENCOUNTER — Other Ambulatory Visit (HOSPITAL_BASED_OUTPATIENT_CLINIC_OR_DEPARTMENT_OTHER): Payer: Self-pay | Admitting: *Deleted

## 2020-08-05 ENCOUNTER — Encounter (HOSPITAL_BASED_OUTPATIENT_CLINIC_OR_DEPARTMENT_OTHER): Payer: Self-pay

## 2020-08-05 DIAGNOSIS — E7800 Pure hypercholesterolemia, unspecified: Secondary | ICD-10-CM

## 2020-08-05 DIAGNOSIS — Z789 Other specified health status: Secondary | ICD-10-CM

## 2020-08-05 DIAGNOSIS — I700 Atherosclerosis of aorta: Secondary | ICD-10-CM

## 2020-08-05 NOTE — Telephone Encounter (Signed)
Lvm and sent mychart with Scott's recommendations.  Lab orders in and appt made and linked.

## 2020-11-05 ENCOUNTER — Other Ambulatory Visit: Payer: Self-pay | Admitting: Otolaryngology

## 2020-11-05 DIAGNOSIS — H90A21 Sensorineural hearing loss, unilateral, right ear, with restricted hearing on the contralateral side: Secondary | ICD-10-CM

## 2020-11-17 ENCOUNTER — Other Ambulatory Visit: Payer: Self-pay

## 2020-11-17 MED ORDER — EZETIMIBE 10 MG PO TABS: 10.0000 mg | ORAL_TABLET | Freq: Every day | ORAL | 0 refills | Status: DC

## 2020-11-27 ENCOUNTER — Other Ambulatory Visit: Payer: Self-pay

## 2020-11-27 ENCOUNTER — Ambulatory Visit
Admission: RE | Admit: 2020-11-27 | Discharge: 2020-11-27 | Disposition: A | Discharge status: Home / Self Care | Payer: Medicare Other | Source: Ambulatory Visit | Attending: Otolaryngology | Admitting: Otolaryngology

## 2020-11-27 DIAGNOSIS — H90A21 Sensorineural hearing loss, unilateral, right ear, with restricted hearing on the contralateral side: Secondary | ICD-10-CM

## 2020-11-27 MED ORDER — GADOBENATE DIMEGLUMINE 529 MG/ML IV SOLN
17.0000 mL | Freq: Once | INTRAVENOUS | Status: AC | PRN
  Administered 2020-11-27: 09:00:00 17 mL via INTRAVENOUS

## 2020-12-10 ENCOUNTER — Other Ambulatory Visit: Payer: Medicare Other | Admitting: *Deleted

## 2020-12-10 ENCOUNTER — Other Ambulatory Visit: Payer: Self-pay

## 2020-12-10 LAB — COMPREHENSIVE METABOLIC PANEL
ALT: 19 IU/L (ref 0–44)
AST: 20 IU/L (ref 0–40)
Albumin/Globulin Ratio: 2 (ref 1.2–2.2)
Albumin: 4.3 g/dL (ref 3.7–4.7)
Alkaline Phosphatase: 83 IU/L (ref 44–121)
BUN/Creatinine Ratio: 13 (ref 10–24)
BUN: 11 mg/dL (ref 8–27)
Bilirubin Total: 0.5 mg/dL (ref 0.0–1.2)
CO2: 25 mmol/L (ref 20–29)
Calcium: 9.4 mg/dL (ref 8.6–10.2)
Chloride: 102 mmol/L (ref 96–106)
Creatinine, Ser: 0.88 mg/dL (ref 0.76–1.27)
Globulin, Total: 2.1 g/dL (ref 1.5–4.5)
Glucose: 100 mg/dL — ABNORMAL HIGH (ref 70–99)
Potassium: 4.2 mmol/L (ref 3.5–5.2)
Sodium: 139 mmol/L (ref 134–144)
Total Protein: 6.4 g/dL (ref 6.0–8.5)
eGFR: 88 mL/min/{1.73_m2} (ref >59)

## 2020-12-10 LAB — LIPID PANEL
Chol/HDL Ratio: 3.2 ratio (ref 0.0–5.0)
Cholesterol, Total: 252 mg/dL — ABNORMAL HIGH (ref 100–199)
HDL: 78 mg/dL (ref >39)
LDL Chol Calc (NIH): 166 mg/dL — ABNORMAL HIGH (ref 0–99)
Triglycerides: 54 mg/dL (ref 0–149)
VLDL Cholesterol Cal: 8 mg/dL (ref 5–40)

## 2020-12-16 DIAGNOSIS — I2510 Atherosclerotic heart disease of native coronary artery without angina pectoris: Secondary | ICD-10-CM | POA: Insufficient documentation

## 2020-12-16 DIAGNOSIS — E7800 Pure hypercholesterolemia, unspecified: Secondary | ICD-10-CM | POA: Insufficient documentation

## 2020-12-16 DIAGNOSIS — I10 Essential (primary) hypertension: Secondary | ICD-10-CM | POA: Insufficient documentation

## 2020-12-16 DIAGNOSIS — I6501 Occlusion and stenosis of right vertebral artery: Secondary | ICD-10-CM | POA: Insufficient documentation

## 2020-12-16 DIAGNOSIS — I700 Atherosclerosis of aorta: Secondary | ICD-10-CM | POA: Insufficient documentation

## 2020-12-16 DIAGNOSIS — I2584 Coronary atherosclerosis due to calcified coronary lesion: Secondary | ICD-10-CM | POA: Insufficient documentation

## 2020-12-16 NOTE — Progress Notes (Signed)
Cardiology Office Note:    Date:  12/17/2020   ID:  Frederick Hall, DOB 09/13/1940, MRN [id number]  PCP:  Frederick Sacramento, MD   [facility] Providers Cardiologist:  Frederick Mocha, MD Cardiology APP:  Frederick Hall     Referring MD: Frederick Sacramento, MD   Chief Complaint:  F/u for CAD    Patient Profile:   Frederick Hall is a 80 y.o. male with:  Coronary artery Ca2+ (CT in 07/2020) Prostate CA Rx: Lupron  BPH Lung CA s/p VATS w RLL resection in 2020 Ex smoker Hypertension  Aortic atherosclerosis  GERD  Cardiomegaly   History of Present Illness: Frederick Hall was last seen by Frederick Merle, NP in 12/21.  He returns for f/u.  Of note, a recent brain MRI done for hearing loss demonstrated loss of the R vertebral artery flow void suggestive of R VA stenosis or occlusion.  A head an neck CTA was recommended.    He is here alone.  Overall, he has been doing well.  He remains on ezetimibe.  He had issues with atorvastatin and rosuvastatin causing myalgias.  He has not had chest pain, significant shortness of breath, syncope, orthopnea, leg edema.  He has not had any significant dizziness.  He does have occasional vertigo.  This is managed with Epley's maneuvers as well as meclizine.  He has not had any steal symptoms.  ASSESSMENT & PLAN:   Stenosis of right vertebral artery Recent brain MRI demonstrated loss of the right vertebral artery flow void suggestive of right vertebral artery stenosis or occlusion.  Similar findings were noted on a MRI in 2016.  He is asymptomatic.  Given the findings on his MRI, I have recommended that we go ahead and proceed with a head and neck CTA is recommended.  Coronary artery calcification Noted on prior CT.  He is doing well without anginal symptoms.  He has had difficulty tolerating statins due to myalgias.  He remains on ezetimibe.  Essential hypertension BP somewhat above goal.  He has BP readings in the 130s-140s at  home.  He stopped HCTZ due to low BP and urinary symptoms.  Increase Lisinopril to 30 mg once daily. BMET 1 month.   Pure hypercholesterolemia LDL above target.  He remains on ezetimibe 10 mg daily.  We will rechallenge him with atorvastatin 10 mg every Monday, Wednesday, Friday.  If he cannot tolerate this, I will refer him to our pharmacy team to consider PCSK9 inhibitor.  Arrange fasting lipids and LFTs in 3 months.  Aortic atherosclerosis (HCC) Continue ezetimibe.  Resume atorvastatin as outlined.  Start aspirin 81 mg daily  Lung cancer, right lower lobe ([place]) He continues to follow with T CTS for serial CT scans.  Malignant neoplasm of prostate [facility]) He has completed Lupron therapy.           Dispo:  Return in about 1 year (around 12/17/2021) for Routine Follow Up, w/ Frederick Dopp, PA-C.  He was previously seen by Frederick Merle, NP only.  I will see him going forward but will assign him to Dr. Burt Hall as his cardiologist.     Prior CV studies: Echocardiogram 02/15/19 EF 60-65, mild LVH, gr 1 DD, no RWMA, normal RVSF, mod LAE, mild TR, mild AI, AV sclerosis w/o AS, mildly elevated PASP, RVSP 31.5     Past Medical History:  Diagnosis Date   Arthritis    Asthma 2005   "attack x 1"   BCC (  basal cell carcinoma of skin) 04/15/2008   Right Forehead   BCC (basal cell carcinoma of skin) 08/26/2016   Mid Forehead   BPH (benign prostatic hyperplasia)    Erectile dysfunction    GERD (gastroesophageal reflux disease)    Headache    "Stabbing"   Hematuria 12/2016   History of acute prostatitis 12/2016   Hypertension    Lipoma 02/08/2014   Small subcutaneous mass right temporal region, noted on MRI Brain, pt unaware   Nodular basal cell carcinoma (BCC) 07/20/2016   Mid Forehead   Nodular basal cell carcinoma (BCC) 05/26/2017   Left Nare   Nodule of right lung 08/26/2017   61mm ground glass posterior nodule, noted on CT chest   Nodulo-ulcerative basal cell carcinoma (BCC) 10/21/2016    Right Shin   Osteoarthritis of left hip 08/07/2012   Osteoarthritis of right hip 05/23/2012   Prostate cancer ([place])    Renal cyst, right 06/27/2017   Large peripelvic, noted on NM Bone scan   SCC (squamous cell carcinoma) 01/27/2009   Left Outer Brow - Well Diff   Seasonal allergies    Skin cancer    Squamous cell carcinoma in situ (SCCIS) 07/27/2013   Left Forehead   Superficial basal cell carcinoma (BCC) 10/14/2014   Tip of Nose   Wears hearing aid    Current Medications: Current Meds  Medication Sig   acetaminophen (TYLENOL) 500 MG tablet Take 500-1,000 mg by mouth every 8 (eight) hours as needed for moderate pain or headache.   atorvastatin (LIPITOR) 10 MG tablet Take 1 tablet (10 mg total) by mouth 3 (three) times a week. Mon, Wed, and Friday only.   CALCIUM-MAGNESIUM-VITAMIN D PO Take 2 tablets by mouth daily.   ezetimibe (ZETIA) 10 MG tablet Take 1 tablet (10 mg total) by mouth daily.   famotidine (PEPCID) 20 MG tablet Take 20 mg by mouth daily before breakfast.    Multiple Vitamin (MULTIVITAMIN WITH MINERALS) TABS tablet Take 1 tablet by mouth daily.   Multiple Vitamins-Minerals (ZINC PO) Take 15 mg by mouth daily.    naproxen sodium (ALEVE) 220 MG tablet Take 220 mg by mouth 2 (two) times daily as needed (heavy activity (golf, exercise)).    phenylephrine (NEO-SYNEPHRINE) 1 % nasal spray Place 1 drop into both nostrils every 6 (six) hours as needed for congestion.   senna (SENOKOT) 8.6 MG TABS tablet Take 1 tablet by mouth daily.   vitamin C (ASCORBIC ACID) 500 MG tablet Take 500 mg by mouth daily.   [DISCONTINUED] lisinopril (PRINIVIL,ZESTRIL) 20 MG tablet Take 20 mg by mouth every morning.    Allergies:   Penicillins, Crestor [rosuvastatin], Lipitor [atorvastatin], Cefuroxime axetil, Other, and Pollen extract   Social History   Tobacco Use   Smoking status: Former    Packs/day: 1.00    Years: 15.00    Pack years: 15.00    Types: Cigarettes    Quit date: 05/18/1972     Years since quitting: 48.6   Smokeless tobacco: Never   Tobacco comments:    social alcohol  Vaping Use   Vaping Use: Never used  Substance Use Topics   Alcohol use: Yes    Alcohol/week: 7.0 standard drinks    Types: 7 Standard drinks or equivalent per week    Comment: 3  wine or beer   Drug use: No    Family Hx: The patient's family history includes Cancer in his father and mother.  ROS see HPI  EKGs/Labs/Other Test Reviewed:  EKG:  EKG is  ordered today.  The ekg ordered today demonstrates NSR, HR 63, normal axis, nonspecific ST-T wave changes, QTC 415  Recent Labs: 12/10/2020: ALT 19; BUN 11; Creatinine, Ser 0.88; Potassium 4.2; Sodium 139   Recent Lipid Panel Lab Results  Component Value Date/Time   CHOL 252 (H) 12/10/2020 12:00 AM   TRIG 54 12/10/2020 12:00 AM   HDL 78 12/10/2020 12:00 AM   LDLCALC 166 (H) 12/10/2020 12:00 AM     Risk Assessment/Calculations:          Physical Exam:    VS:  BP (!) 142/80   Pulse 63   Ht 5\' 9"  (1.753 m)   Wt 201 lb 9.6 oz (91.4 kg)   SpO2 98%   BMI 29.77 kg/m     Wt Readings from Last 3 Encounters:  12/17/20 201 lb 9.6 oz (91.4 kg)  02/07/20 200 lb (90.7 kg)  12/31/19 199 lb 12.8 oz (90.6 kg)    Constitutional:      Appearance: Healthy appearance. Not in distress.  Neck:     Vascular: No carotid bruit or JVR. JVD normal.  Pulmonary:     Effort: Pulmonary effort is normal.     Breath sounds: No wheezing. No rales.  Cardiovascular:     Normal rate. Regular rhythm. Normal S1. Normal S2.      Murmurs: There is no murmur.  Edema:    Peripheral edema absent.  Abdominal:     Palpations: Abdomen is soft. There is no hepatomegaly.  Skin:    General: Skin is warm and dry.  Neurological:     General: No focal deficit present.     Mental Status: Alert and oriented to person, place and time.     Cranial Nerves: Cranial nerves are intact.       Medication Adjustments/Labs and Tests Ordered: Current medicines  are reviewed at length with the patient today.  Concerns regarding medicines are outlined above.  Tests Ordered: Orders Placed This Encounter  Procedures   CT ANGIO NECK W OR WO CONTRAST   CT ANGIO HEAD W OR WO CONTRAST   Basic Metabolic Panel (BMET)   Lipid Profile   Hepatic function panel   EKG 12-Lead   Medication Changes: Meds ordered this encounter  Medications   lisinopril (ZESTRIL) 20 MG tablet    Sig: Take 1.5 tablets (30 mg total) by mouth every morning.    Dispense:  135 tablet    Refill:  3   atorvastatin (LIPITOR) 10 MG tablet    Sig: Take 1 tablet (10 mg total) by mouth 3 (three) times a week. Mon, Wed, and Friday only.    Dispense:  36 tablet    Refill:  3   Signed, Frederick Dopp, PA-C  12/17/2020 9:39 AM    [facility] [place], [place],   [address] Phone: [phone]; Fax: [phone]

## 2020-12-17 ENCOUNTER — Other Ambulatory Visit: Payer: Self-pay

## 2020-12-17 ENCOUNTER — Ambulatory Visit (INDEPENDENT_AMBULATORY_CARE_PROVIDER_SITE_OTHER): Payer: Medicare Other | Admitting: Physician Assistant

## 2020-12-17 ENCOUNTER — Encounter: Payer: Self-pay | Admitting: Physician Assistant

## 2020-12-17 VITALS — BP 142/80 | HR 63 | Ht 69.0 in | Wt 201.6 lb

## 2020-12-17 DIAGNOSIS — I700 Atherosclerosis of aorta: Secondary | ICD-10-CM

## 2020-12-17 DIAGNOSIS — I2584 Coronary atherosclerosis due to calcified coronary lesion: Secondary | ICD-10-CM | POA: Diagnosis not present

## 2020-12-17 DIAGNOSIS — E7800 Pure hypercholesterolemia, unspecified: Secondary | ICD-10-CM | POA: Diagnosis not present

## 2020-12-17 DIAGNOSIS — I2510 Atherosclerotic heart disease of native coronary artery without angina pectoris: Secondary | ICD-10-CM

## 2020-12-17 DIAGNOSIS — I6501 Occlusion and stenosis of right vertebral artery: Secondary | ICD-10-CM

## 2020-12-17 DIAGNOSIS — I10 Essential (primary) hypertension: Secondary | ICD-10-CM

## 2020-12-17 DIAGNOSIS — C61 Malignant neoplasm of prostate: Secondary | ICD-10-CM | POA: Diagnosis not present

## 2020-12-17 DIAGNOSIS — C3431 Malignant neoplasm of lower lobe, right bronchus or lung: Secondary | ICD-10-CM

## 2020-12-17 MED ORDER — LISINOPRIL 20 MG PO TABS: 30.0000 mg | ORAL_TABLET | Freq: Every morning | ORAL | 3 refills | Status: DC

## 2020-12-17 MED ORDER — ATORVASTATIN CALCIUM 10 MG PO TABS: 10.0000 mg | ORAL_TABLET | ORAL | 3 refills | Status: DC

## 2020-12-17 NOTE — Patient Instructions (Signed)
Medication Instructions:   START Asprin one (1) tablet by mouth ( 81 mg) daily.  INCREASE Lisinopril one and one half tablet by mouth ( 30 mg) daily.  START Lipitor one (1) tablet by mouth (10 mg) Monday, Wednesday and Friday Only.   *If you need a refill on your cardiac medications before your next appointment, please call your pharmacy*   Lab Work:  Your physician recommends that you return for lab work on Tuesday, January 17. You can come in on the day of your appointment anytime between 7:30-4:30.   Your physician recommends that you return for a FASTING lipid profile/lft on Tuesday, March 14. You can come in on the day of your appointment anytime between 7:30-4:30 fasting from midnight the night before.    If you have labs (blood work) drawn today and your tests are completely normal, you will receive your results only by: [place] (if you have MyChart) OR A paper copy in the mail If you have any lab test that is abnormal or we need to change your treatment, we will call you to review the results.   Testing/Procedures:  Non-Cardiac CT Angiography (CTA), is a special type of CT scan that uses a computer to produce multi-dimensional views of major blood vessels throughout the body. In CT angiography, a contrast material is injected through an IV to help visualize the blood vessels. OF THE NECK.  Non-Cardiac CT Angiography (CTA), is a special type of CT scan that uses a computer to produce multi-dimensional views of major blood vessels throughout the body. In CT angiography, a contrast material is injected through an IV to help visualize the blood vessels. OF THE HEAD.      Follow-Up: At [facility], you and your health needs are our priority.  As part of our continuing mission to provide you with exceptional heart care, we have created designated Provider Care Teams.  These Care Teams include your primary Cardiologist (physician) and Advanced Practice Providers (APPs -   Physician Assistants and Nurse Practitioners) who all work together to provide you with the care you need, when you need it.  We recommend signing up for the patient portal called "MyChart".  Sign up information is provided on this After Visit Summary.  MyChart is used to connect with patients for Virtual Visits (Telemedicine).  Patients are able to view lab/test results, encounter notes, upcoming appointments, etc.  Non-urgent messages can be sent to your provider as well.   To learn more about what you can do with MyChart, go to [email or web address].    Your next appointment:   1 year(s)  The format for your next appointment:   In Person  Provider:   [name], PA-C         Other Instructions  Your physician wants you to follow-up in: 1 Year with [name], PA-C.  You will receive a reminder letter in the mail two months in advance. If you don't receive a letter, please call our office to schedule the follow-up appointment.  Call office at [phone] or send mychart message if your blood pressure consistently X 3 stays above 678 systolic (the top number).

## 2020-12-17 NOTE — Assessment & Plan Note (Signed)
He continues to follow with T CTS for serial CT scans.

## 2020-12-17 NOTE — Assessment & Plan Note (Signed)
BP somewhat above goal.  He has BP readings in the 130s-140s at home.  He stopped HCTZ due to low BP and urinary symptoms.  Increase Lisinopril to 30 mg once daily. BMET 1 month.

## 2020-12-17 NOTE — Assessment & Plan Note (Signed)
He has completed Lupron therapy.

## 2020-12-17 NOTE — Assessment & Plan Note (Signed)
Continue ezetimibe.  Resume atorvastatin as outlined.  Start aspirin 81 mg daily

## 2020-12-17 NOTE — Assessment & Plan Note (Signed)
Recent brain MRI demonstrated loss of the right vertebral artery flow void suggestive of right vertebral artery stenosis or occlusion.  Similar findings were noted on a MRI in 2016.  He is asymptomatic.  Given the findings on his MRI, I have recommended that we go ahead and proceed with a head and neck CTA is recommended.

## 2020-12-17 NOTE — Assessment & Plan Note (Signed)
Noted on prior CT.  He is doing well without anginal symptoms.  He has had difficulty tolerating statins due to myalgias.  He remains on ezetimibe.

## 2020-12-17 NOTE — Assessment & Plan Note (Signed)
LDL above target.  He remains on ezetimibe 10 mg daily.  We will rechallenge him with atorvastatin 10 mg every Monday, Wednesday, Friday.  If he cannot tolerate this, I will refer him to our pharmacy team to consider PCSK9 inhibitor.  Arrange fasting lipids and LFTs in 3 months.

## 2021-01-09 ENCOUNTER — Other Ambulatory Visit: Payer: Self-pay

## 2021-01-09 ENCOUNTER — Other Ambulatory Visit: Payer: Medicare Other

## 2021-01-09 ENCOUNTER — Ambulatory Visit
Admission: RE | Admit: 2021-01-09 | Discharge: 2021-01-09 | Disposition: A | Discharge status: Home / Self Care | Payer: Medicare Other | Source: Ambulatory Visit | Attending: Physician Assistant | Admitting: Physician Assistant

## 2021-01-09 DIAGNOSIS — C3431 Malignant neoplasm of lower lobe, right bronchus or lung: Secondary | ICD-10-CM

## 2021-01-09 DIAGNOSIS — I700 Atherosclerosis of aorta: Secondary | ICD-10-CM

## 2021-01-09 DIAGNOSIS — I6501 Occlusion and stenosis of right vertebral artery: Secondary | ICD-10-CM

## 2021-01-09 DIAGNOSIS — C61 Malignant neoplasm of prostate: Secondary | ICD-10-CM

## 2021-01-09 DIAGNOSIS — I2510 Atherosclerotic heart disease of native coronary artery without angina pectoris: Secondary | ICD-10-CM

## 2021-01-09 DIAGNOSIS — I10 Essential (primary) hypertension: Secondary | ICD-10-CM

## 2021-01-09 DIAGNOSIS — E7800 Pure hypercholesterolemia, unspecified: Secondary | ICD-10-CM

## 2021-01-09 MED ORDER — IOPAMIDOL (ISOVUE-370) INJECTION 76%
75.0000 mL | Freq: Once | INTRAVENOUS | Status: AC | PRN
  Administered 2021-01-09: 75 mL via INTRAVENOUS

## 2021-01-11 DIAGNOSIS — I671 Cerebral aneurysm, nonruptured: Secondary | ICD-10-CM

## 2021-01-13 NOTE — Telephone Encounter (Signed)
See Head/Neck CTA result note. 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 [name], PA-C    01/13/2021 11:24 AM

## 2021-01-13 NOTE — Telephone Encounter (Signed)
Referral placed.

## 2021-01-26 ENCOUNTER — Other Ambulatory Visit: Payer: Self-pay | Admitting: *Deleted

## 2021-01-26 MED ORDER — AMLODIPINE BESYLATE 2.5 MG PO TABS: 2.5000 mg | ORAL_TABLET | Freq: Every day | ORAL | 11 refills | Status: DC

## 2021-01-26 NOTE — Telephone Encounter (Signed)
Start Amlodipine 2.5 mg once daily [name], PA-C    01/26/2021 4:16 PM

## 2021-01-27 ENCOUNTER — Other Ambulatory Visit: Payer: Medicare Other | Admitting: *Deleted

## 2021-01-27 ENCOUNTER — Other Ambulatory Visit: Payer: Self-pay

## 2021-01-27 DIAGNOSIS — I6501 Occlusion and stenosis of right vertebral artery: Secondary | ICD-10-CM

## 2021-01-27 DIAGNOSIS — C3431 Malignant neoplasm of lower lobe, right bronchus or lung: Secondary | ICD-10-CM

## 2021-01-27 DIAGNOSIS — I2584 Coronary atherosclerosis due to calcified coronary lesion: Secondary | ICD-10-CM

## 2021-01-27 DIAGNOSIS — E7800 Pure hypercholesterolemia, unspecified: Secondary | ICD-10-CM

## 2021-01-27 DIAGNOSIS — I10 Essential (primary) hypertension: Secondary | ICD-10-CM

## 2021-01-27 DIAGNOSIS — C61 Malignant neoplasm of prostate: Secondary | ICD-10-CM

## 2021-01-27 DIAGNOSIS — I2510 Atherosclerotic heart disease of native coronary artery without angina pectoris: Secondary | ICD-10-CM

## 2021-01-27 DIAGNOSIS — I700 Atherosclerosis of aorta: Secondary | ICD-10-CM

## 2021-01-27 LAB — BASIC METABOLIC PANEL
BUN/Creatinine Ratio: 15 (ref 10–24)
BUN: 13 mg/dL (ref 8–27)
CO2: 27 mmol/L (ref 20–29)
Calcium: 9.3 mg/dL (ref 8.6–10.2)
Chloride: 102 mmol/L (ref 96–106)
Creatinine, Ser: 0.86 mg/dL (ref 0.76–1.27)
Glucose: 104 mg/dL — ABNORMAL HIGH (ref 70–99)
Potassium: 4.1 mmol/L (ref 3.5–5.2)
Sodium: 142 mmol/L (ref 134–144)
eGFR: 89 mL/min/{1.73_m2} (ref >59)

## 2021-01-28 ENCOUNTER — Ambulatory Visit (INDEPENDENT_AMBULATORY_CARE_PROVIDER_SITE_OTHER): Payer: Medicare Other | Admitting: Vascular Surgery

## 2021-01-28 ENCOUNTER — Encounter: Payer: Self-pay | Admitting: Vascular Surgery

## 2021-01-28 VITALS — BP 144/88 | HR 64 | Temp 98.0°F | Resp 20 | Ht 69.0 in | Wt 202.0 lb

## 2021-01-28 DIAGNOSIS — I720 Aneurysm of carotid artery: Secondary | ICD-10-CM

## 2021-01-28 DIAGNOSIS — I6501 Occlusion and stenosis of right vertebral artery: Secondary | ICD-10-CM

## 2021-01-28 NOTE — Progress Notes (Signed)
Patient ID: Frederick Hall, male   DOB: 06/02/1940, 81 y.o.   MRN: [id number]  Reason for Consult: New Patient (Initial Visit)   Referred by [name], MD  Subjective:     HPI:  Frederick Hall is a 81 y.o. male without previous history of vascular disease.  He does have hypertension and previously was a smoker when he was in the [place] and younger but quit many years ago.  He does not have any history of stroke, TIA or amaurosis.  He denies any personal or family history of aneurysm disease.  He does not have a limitations to his walking including claudication and has no tissue loss or ulceration.  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  He remains asymptomatic from this.  He denies any history of neck surgery or trauma.  He has begun taking aspirin since these findings.  Past Medical History:  Diagnosis Date   Arthritis    Asthma 2005   "attack x 1"   BCC (basal cell carcinoma of skin) 04/15/2008   Right Forehead   BCC (basal cell carcinoma of skin) 08/26/2016   Mid Forehead   BPH (benign prostatic hyperplasia)    Erectile dysfunction    GERD (gastroesophageal reflux disease)    Headache    "Stabbing"   Hematuria 12/2016   History of acute prostatitis 12/2016   Hypertension    Lipoma 02/08/2014   Small subcutaneous mass right temporal region, noted on MRI Brain, pt unaware   Nodular basal cell carcinoma (BCC) 07/20/2016   Mid Forehead   Nodular basal cell carcinoma (BCC) 05/26/2017   Left Nare   Nodule of right lung 08/26/2017   39mm ground glass posterior nodule, noted on CT chest   Nodulo-ulcerative basal cell carcinoma (BCC) 10/21/2016   Right Shin   Osteoarthritis of left hip 08/07/2012   Osteoarthritis of right hip 05/23/2012   Prostate cancer ([place])    Renal cyst, right  06/27/2017   Large peripelvic, noted on NM Bone scan   SCC (squamous cell carcinoma) 01/27/2009   Left Outer Brow - Well Diff   Seasonal allergies    Skin cancer    Squamous cell carcinoma in situ (SCCIS) 07/27/2013   Left Forehead   Superficial basal cell carcinoma (BCC) 10/14/2014   Tip of Nose   Wears hearing aid    Family History  Problem Relation Age of Onset   Cancer Mother    Cancer Father    Past Surgical History:  Procedure Laterality Date   COLONOSCOPY  09/2017   CYSTOSCOPY N/A 09/29/2017   Procedure: CYSTOSCOPY;  Surgeon: Franchot Gallo, MD;  Location: [facility];  Service: Urology;  Laterality: N/A;  no seeds in bladder per Dr Trinna Post NERVE BLOCK  06/14/2018   Procedure: Intercostal Nerve Block;  Surgeon: [name], MD;  Location: [place];  Service: Thoracic;;   KNEE ARTHROSCOPY Left    LIPOMA EXCISION     multiple   LYMPH NODE DISSECTION  06/14/2018   Procedure: Lymph Node Dissection;  Surgeon: [name], MD;  Location: [place];  Service: Thoracic;;   RADIOACTIVE SEED IMPLANT N/A 09/29/2017   Procedure: RADIOACTIVE SEED IMPLANT/BRACHYTHERAPY IMPLANT;  Surgeon: Franchot Gallo, MD;  Location[facility];  Service: Urology;  Laterality: N/A;  63 seeds   SHOULDER  ARTHROSCOPY Right    SPACE OAR INSTILLATION N/A 09/29/2017   Procedure: SPACE OAR INSTILLATION;  Surgeon: Franchot Gallo, MD;  Location: [facility];  Service: Urology;  Laterality: N/A;   TONSILLECTOMY     TOTAL HIP ARTHROPLASTY Right 05/23/2012   Procedure: TOTAL HIP ARTHROPLASTY;  Surgeon: Johnny Bridge, MD;  Location: [facility];  Service: Orthopedics;  Laterality: Right;   TOTAL HIP ARTHROPLASTY Left 08/07/2012   Procedure: TOTAL HIP ARTHROPLASTY- left ;  Surgeon: Johnny Bridge, MD;  Location: [place];  Service: Orthopedics;  Laterality: Left;   VIDEO ASSISTED THORACOSCOPY (VATS)/WEDGE RESECTION Right 06/14/2018   Procedure:  VIDEO ASSISTED THORACOSCOPY (VATS) WITH LUNG RESECTION OF SUPERIOR SEGMENT OF RIGHT LOWER LOBE;  Surgeon: [name], MD;  Location: [facility];  Service: Thoracic;  Laterality: Right;    Short Social History:  Social History   Tobacco Use   Smoking status: Former    Packs/day: 1.00    Years: 15.00    Pack years: 15.00    Types: Cigarettes    Quit date: 05/18/1972    Years since quitting: 48.7   Smokeless tobacco: Never   Tobacco comments:    social alcohol  Substance Use Topics   Alcohol use: Yes    Alcohol/week: 7.0 standard drinks    Types: 7 Standard drinks or equivalent per week    Comment: 3  wine or beer    Allergies  Allergen Reactions   Penicillins Anaphylaxis, Itching and Rash    Childhood allergy. Tolerated cefazolin 05/23/12. Did it involve swelling of the face/tongue/throat, SOB, or low BP? Yes Did it involve sudden or severe rash/hives, skin peeling, or any reaction on the inside of your mouth or nose? No Did you need to seek medical attention at a hospital or doctor's office? Yes When did it last happen?      childhood allergy If all above answers are NO, may proceed with cephalosporin use.   Other reaction(s): Other Childhood allergy. Tolerated cefazolin 05/23/12. Did it involve swelling of the face/tongue/throat, SOB, or low BP? Yes Did it involve sudden or severe rash/hives, skin peeling, or any reaction on the inside of your mouth or nose? No Did you need to seek medical attention at a hospital or doctor's office? Yes When did it last happen?      childhood allergy If all above answers are NO, may proceed with cephalosporin use. un   Crestor [Rosuvastatin] Other (See Comments)    Myalgias    Lipitor [Atorvastatin] Other (See Comments)    Myalgia     Cefuroxime Axetil Diarrhea   Other     general anesthesia - severe constipation    Pollen Extract Itching    Seasonal allergies.    Current Outpatient Medications  Medication Sig Dispense  Refill   acetaminophen (TYLENOL) 500 MG tablet Take 500-1,000 mg by mouth every 8 (eight) hours as needed for moderate pain or headache.     amLODipine (NORVASC) 2.5 MG tablet Take 1 tablet (2.5 mg total) by mouth daily. 30 tablet 11   aspirin 81 MG EC tablet      atorvastatin (LIPITOR) 10 MG tablet Take 1 tablet (10 mg total) by mouth 3 (three) times a week. Mon, Wed, and Friday only. 36 tablet 3   CALCIUM-MAGNESIUM-VITAMIN D PO Take 2 tablets by mouth daily.     ezetimibe (ZETIA) 10 MG tablet Take 1 tablet (10 mg total) by mouth daily. 90 tablet 0   famotidine (PEPCID) 20  MG tablet Take 20 mg by mouth daily before breakfast.      lisinopril (ZESTRIL) 20 MG tablet Take 1.5 tablets (30 mg total) by mouth every morning. 135 tablet 3   Multiple Vitamin (MULTIVITAMIN WITH MINERALS) TABS tablet Take 1 tablet by mouth daily.     Multiple Vitamins-Minerals (ZINC PO) Take 15 mg by mouth daily.      naproxen sodium (ALEVE) 220 MG tablet Take 220 mg by mouth 2 (two) times daily as needed (heavy activity (golf, exercise)).      phenylephrine (NEO-SYNEPHRINE) 1 % nasal spray Place 1 drop into both nostrils every 6 (six) hours as needed for congestion.     senna (SENOKOT) 8.6 MG TABS tablet Take 1 tablet by mouth daily.     vitamin C (ASCORBIC ACID) 500 MG tablet Take 500 mg by mouth daily.     No current facility-administered medications for this visit.    Review of Systems  Constitutional:  Constitutional negative. HENT:       Hearing loss Eyes: Eyes negative.  Respiratory: Respiratory negative.  Cardiovascular: Cardiovascular negative.  GI: Gastrointestinal negative.  Musculoskeletal: Musculoskeletal negative.  Skin: Skin negative.  Neurological: Neurological negative. Hematologic: Hematologic/lymphatic negative.  Psychiatric: Psychiatric negative.       Objective:  Objective   Vitals:   01/28/21 0922 01/28/21 0924  BP: (!) 147/89 (!) 144/88  Pulse: 64   Resp: 20   Temp: 98 F (36.7  C)   SpO2: 98%   Weight: 202 lb (91.6 kg)   Height: 5\' 9"  (1.753 m)    Body mass index is 29.83 kg/m.  Physical Exam HENT:     Head: Normocephalic.     Nose:     Comments: Wearing a mask Eyes:     Pupils: Pupils are equal, round, and reactive to light.  Neck:     Vascular: No carotid bruit.  Cardiovascular:     Rate and Rhythm: Normal rate and regular rhythm.     Pulses:          Radial pulses are 2+ on the right side and 2+ on the left side.       Popliteal pulses are 2+ on the right side and 2+ on the left side.       Dorsalis pedis pulses are 1+ on the right side and 1+ on the left side.       Posterior tibial pulses are 0 on the right side and 0 on the left side.     Heart sounds: Normal heart sounds.  Pulmonary:     Effort: Pulmonary effort is normal.     Breath sounds: Normal breath sounds.  Abdominal:     General: Abdomen is flat.     Palpations: Abdomen is soft. There is no mass.  Musculoskeletal:        General: Normal range of motion.     Right lower leg: No edema.     Left lower leg: Edema present.  Skin:    General: Skin is warm.     Capillary Refill: Capillary refill takes less than 2 seconds.  Neurological:     General: No focal deficit present.     Mental Status: He is alert.  Psychiatric:        Mood and Affect: Mood normal.        Behavior: Behavior normal.    Data: MRI IMPRESSION: 1. Loss of the right vertebral artery flow void, which was previously visualized on the 2016 study,  suggestive of interval development slow flow in or occlusion of the right vertebral artery, of indeterminate acuity. A CTA head and neck is recommended for further evaluation. 2. No acute intracranial process. Normal MRI of the internal auditory canals. No etiology is seen for the patient's hearing loss.  CTA IMPRESSION: 1. Occlusion of the cervical right vertebral artery at its origin with minimal reconstitution in the distal V2 segment. The vessel is again  occluded at the dural margin. 2. The left vertebral artery dominant without significant stenosis. 3. The distal right vertebral artery is reconstituted at the level of the PICA. 4. Tortuosity of the cervical internal carotid arteries bilaterally without significant stenosis. This is nonspecific, but most commonly seen in the setting chronic hypertension. 5. 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 6. Normal variant CTA circle-of-Willis without significant proximal stenosis, aneurysm, or branch vessel occlusion. 7. Multilevel spondylosis of the cervical spine is greatest at C5-6 and C6-7. 8. Aortic Atherosclerosis (ICD10-I70.0).     Assessment/Plan:     81 year old male found to have incidental pseudoaneurysm of the right ICA with occlusion of his right vertebral artery that reconstitutes distally.  He has remained asymptomatic from these.  Given the unknown timeframe of the pseudoaneurysm I will follow him up in 1 year with repeat CT angio.  If this is unchanged at this time we can consider no further follow-up versus increased interval of time follow-up.  I discussed the signs and symptoms of stroke and he demonstrates good understanding.     Waynetta Sandy MD [facility]

## 2021-01-29 ENCOUNTER — Encounter: Payer: Self-pay | Admitting: Physician Assistant

## 2021-01-29 DIAGNOSIS — I720 Aneurysm of carotid artery: Secondary | ICD-10-CM

## 2021-01-29 HISTORY — DX: Aneurysm of carotid artery: I72.0

## 2021-02-24 ENCOUNTER — Telehealth: Payer: Self-pay | Admitting: *Deleted

## 2021-02-24 ENCOUNTER — Other Ambulatory Visit: Payer: Self-pay | Admitting: *Deleted

## 2021-02-24 DIAGNOSIS — I10 Essential (primary) hypertension: Secondary | ICD-10-CM

## 2021-02-24 MED ORDER — LISINOPRIL 40 MG PO TABS: 40.0000 mg | ORAL_TABLET | Freq: Every morning | ORAL | 3 refills | Status: DC

## 2021-02-24 NOTE — Telephone Encounter (Signed)
Some BPs are optimal and some above goal. Let's increase Lisinopril to 40 mg once daily. Check BMET in 1 month. [name], PA-C    02/24/2021 1:27 PM

## 2021-02-24 NOTE — Telephone Encounter (Signed)
S/w pt is aware of recommendations. Dose of lisinopril increased ( 40 mg) daily. Medication list updated.  Bmet added to upcoming lab appt in March.

## 2021-03-24 ENCOUNTER — Other Ambulatory Visit: Payer: Self-pay

## 2021-03-24 ENCOUNTER — Other Ambulatory Visit: Payer: Medicare Other

## 2021-03-24 DIAGNOSIS — C61 Malignant neoplasm of prostate: Secondary | ICD-10-CM

## 2021-03-24 DIAGNOSIS — I10 Essential (primary) hypertension: Secondary | ICD-10-CM

## 2021-03-24 DIAGNOSIS — I700 Atherosclerosis of aorta: Secondary | ICD-10-CM

## 2021-03-24 DIAGNOSIS — E7800 Pure hypercholesterolemia, unspecified: Secondary | ICD-10-CM

## 2021-03-24 DIAGNOSIS — C3431 Malignant neoplasm of lower lobe, right bronchus or lung: Secondary | ICD-10-CM

## 2021-03-24 DIAGNOSIS — I2510 Atherosclerotic heart disease of native coronary artery without angina pectoris: Secondary | ICD-10-CM

## 2021-03-24 DIAGNOSIS — I6501 Occlusion and stenosis of right vertebral artery: Secondary | ICD-10-CM

## 2021-03-24 LAB — BASIC METABOLIC PANEL
BUN/Creatinine Ratio: 15 (ref 10–24)
BUN: 12 mg/dL (ref 8–27)
CO2: 25 mmol/L (ref 20–29)
Calcium: 9.1 mg/dL (ref 8.6–10.2)
Chloride: 104 mmol/L (ref 96–106)
Creatinine, Ser: 0.82 mg/dL (ref 0.76–1.27)
Glucose: 101 mg/dL — ABNORMAL HIGH (ref 70–99)
Potassium: 4.2 mmol/L (ref 3.5–5.2)
Sodium: 140 mmol/L (ref 134–144)
eGFR: 90 mL/min/{1.73_m2} (ref >59)

## 2021-03-24 LAB — HEPATIC FUNCTION PANEL
ALT: 21 IU/L (ref 0–44)
AST: 22 IU/L (ref 0–40)
Albumin: 4.5 g/dL (ref 3.7–4.7)
Alkaline Phosphatase: 76 IU/L (ref 44–121)
Bilirubin Total: 0.6 mg/dL (ref 0.0–1.2)
Bilirubin, Direct: 0.16 mg/dL (ref 0.00–0.40)
Total Protein: 6.3 g/dL (ref 6.0–8.5)

## 2021-03-24 LAB — LIPID PANEL
Chol/HDL Ratio: 2.7 ratio (ref 0.0–5.0)
Cholesterol, Total: 208 mg/dL — ABNORMAL HIGH (ref 100–199)
HDL: 76 mg/dL (ref >39)
LDL Chol Calc (NIH): 124 mg/dL — ABNORMAL HIGH (ref 0–99)
Triglycerides: 44 mg/dL (ref 0–149)
VLDL Cholesterol Cal: 8 mg/dL (ref 5–40)

## 2021-03-26 ENCOUNTER — Telehealth: Payer: Self-pay

## 2021-03-26 NOTE — Telephone Encounter (Signed)
Referral has been placed for lipid clinic.  ?

## 2021-03-26 NOTE — Telephone Encounter (Signed)
-----   Message from Liliane Shi, [place] sent at 03/26/2021  8:09 AM EDT ----- ?LDL above goal.  Creatinine, K+ normal.  LFTs normal.  ?Pt intolerant to statins and only on Zetia. ?PLAN:  ?-Refer to [facility] clinic for consideration of bempedoic acid vs PCSK9i. ?-If not already stopped, ok to stop Atorvastatin given side effects. ?-O/w continue current meds.  ?[name], PA-C    ?03/26/2021 8:01 AM   ?

## 2021-04-21 ENCOUNTER — Telehealth: Payer: Self-pay | Admitting: Pharmacist

## 2021-04-21 ENCOUNTER — Ambulatory Visit (INDEPENDENT_AMBULATORY_CARE_PROVIDER_SITE_OTHER): Payer: Medicare Other | Admitting: Pharmacist

## 2021-04-21 DIAGNOSIS — I6501 Occlusion and stenosis of right vertebral artery: Secondary | ICD-10-CM

## 2021-04-21 DIAGNOSIS — E7800 Pure hypercholesterolemia, unspecified: Secondary | ICD-10-CM

## 2021-04-21 MED ORDER — PRALUENT 75 MG/ML ~~LOC~~ SOAJ: 1.0000 "pen " | SUBCUTANEOUS | 11 refills | Status: DC

## 2021-04-21 NOTE — Telephone Encounter (Addendum)
Called pharmacy and confirmed copay is $511 due to the deductible on his [employer] plan. Will await Leqvio benefit investigation determination. Left message for pt with this update. ?

## 2021-04-21 NOTE — Progress Notes (Signed)
Patient ID: Frederick Hall                 DOB: [birth year]                    MRN: [id number] ? ? ? ? ?HPI: ?Frederick Hall is a pleasant 81 y.o. male patient referred to lipid clinic by [name], PA. PMH is significant for aortic atherosclerosis and CAD noted on 07/2020 CT, HTN, former tobacco use, lung cancer, GERD, BPH, and prostate cancer. ? ?Pt presents today for follow up. He experienced joint pain in his hands about 4-6 weeks after starting both atorvastatin and rosuvastatin. Pain resolved a few weeks after statin discontinuation in each case. Symptoms were present even with very low dose and frequency statin use. ? ?Current Medications: ezetimibe 10 mg daily ?Intolerances: atorvastatin 10mg  MWF, every other day, and daily, rosuvastatin 5mg  3x and 4x/week - myalgias ?Risk Factors: aortic atherosclerosis and CAD noted on CT ?LDL goal: 70mg /dL ? ?Family History: Cancer in his father and mother.  ? ?Social History: The patient  reports that he quit smoking about 47 years ago. His smoking use included cigarettes. He has a 15.00 pack-year smoking history. He has never used smokeless tobacco. He reports current alcohol use of about 7.0 standard drinks of alcohol per week. He reports that he does not use drugs ? ?Labs: ?03/24/21: TC 208, TG 44, HDL 76, LDL 124 (ezetimibe 10mg  daily) ? ?Past Medical History:  ?Diagnosis Date  ? Arthritis   ? Asthma 2005  ? "attack x 1"  ? BCC (basal cell carcinoma of skin) 04/15/2008  ? Right Forehead  ? BCC (basal cell carcinoma of skin) 08/26/2016  ? Mid Forehead  ? BPH (benign prostatic hyperplasia)   ? Erectile dysfunction   ? GERD (gastroesophageal reflux disease)   ? Headache   ? "Stabbing"  ? Hematuria 12/2016  ? History of acute prostatitis 12/2016  ? Hypertension   ? Lipoma 02/08/2014  ? Small subcutaneous mass right temporal region, noted on MRI Brain, pt unaware  ? Nodular basal cell carcinoma (BCC) 07/20/2016  ? Mid Forehead  ? Nodular basal cell  carcinoma (BCC) 05/26/2017  ? Left Nare  ? Nodule of right lung 08/26/2017  ? 12mm ground glass posterior nodule, noted on CT chest  ? Nodulo-ulcerative basal cell carcinoma (BCC) 10/21/2016  ? Right Shin  ? Osteoarthritis of left hip 08/07/2012  ? Osteoarthritis of right hip 05/23/2012  ? Prostate cancer ([place])   ? Pseudoaneurysm of carotid artery ([place]) 01/29/2021  ? 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 Donzetta Matters) 01/2021 - Plan f/u CTA in 1 year  ? Renal cyst, right 06/27/2017  ? Large peripelvic, noted on NM Bone scan  ? SCC (squamous cell carcinoma) 01/27/2009  ? Left Outer Brow - Well Diff  ? Seasonal allergies   ? Skin cancer   ? Squamous cell carcinoma in situ (SCCIS) 07/27/2013  ? Left Forehead  ? Superficial basal cell carcinoma (BCC) 10/14/2014  ? Tip of Nose  ? Wears hearing aid   ? ? ?Current Outpatient Medications on File Prior to Visit  ?Medication Sig Dispense Refill  ? acetaminophen (TYLENOL) 500 MG tablet Take 500-1,000 mg by mouth every 8 (eight) hours as needed for moderate pain or headache.    ? amLODipine (NORVASC) 2.5 MG tablet Take 1 tablet (2.5 mg total) by mouth daily. [place]  tablet 11  ? aspirin 81 MG EC tablet     ? CALCIUM-MAGNESIUM-VITAMIN D PO Take 2 tablets by mouth daily.    ? ezetimibe (ZETIA) 10 MG tablet Take 1 tablet (10 mg total) by mouth daily. 90 tablet 0  ? famotidine (PEPCID) 20 MG tablet Take 20 mg by mouth daily before breakfast.     ? lisinopril (ZESTRIL) 40 MG tablet Take 1 tablet (40 mg total) by mouth every morning. 90 tablet 3  ? Multiple Vitamin (MULTIVITAMIN WITH MINERALS) TABS tablet Take 1 tablet by mouth daily.    ? Multiple Vitamins-Minerals (ZINC PO) Take 15 mg by mouth daily.     ? naproxen sodium (ALEVE) 220 MG tablet Take 220 mg by mouth 2 (two) times daily as needed (heavy activity (golf, exercise)).     ? phenylephrine (NEO-SYNEPHRINE) 1 % nasal spray Place 1 drop into both nostrils every 6 (six) hours  as needed for congestion.    ? senna (SENOKOT) 8.6 MG TABS tablet Take 1 tablet by mouth daily.    ? vitamin C (ASCORBIC ACID) 500 MG tablet Take 500 mg by mouth daily.    ? ?No current facility-administered medications on file prior to visit.  ? ? ?Allergies  ?Allergen Reactions  ? Penicillins Anaphylaxis, Itching and Rash  ?  Childhood allergy. Tolerated cefazolin 05/23/12. ?Did it involve swelling of the face/tongue/throat, SOB, or low BP? Yes ?Did it involve sudden or severe rash/hives, skin peeling, or any reaction on the inside of your mouth or nose? No ?Did you need to seek medical attention at a hospital or doctor's office? Yes ?When did it last happen?      childhood allergy ?If all above answers are ?NO?, may proceed with cephalosporin use. ? ? ?Other reaction(s): Other ?Childhood allergy. Tolerated cefazolin 05/23/12. ?Did it involve swelling of the face/tongue/throat, SOB, or low BP? Yes ?Did it involve sudden or severe rash/hives, skin peeling, or any reaction on the inside of your mouth or nose? No ?Did you need to seek medical attention at a hospital or doctor's office? Yes ?When did it last happen?      childhood allergy ?If all above answers are ?NO?, may proceed with cephalosporin use. ?un  ? Crestor [Rosuvastatin] Other (See Comments)  ?  Myalgias   ? Lipitor [Atorvastatin] Other (See Comments)  ?  Myalgia ? ?  ? Cefuroxime Axetil Diarrhea  ? Other   ?  general anesthesia - severe constipation   ? Pollen Extract Itching  ?  Seasonal allergies.  ? ? ?Assessment/Plan: ? ?1. Hyperlipidemia - LDL 124mg /dL on ezetimibe 10mg  daily, above goal < 70. Pt is intolerant to very low dose/frequency atorvastatin and rosuvastatin. Discussed additional options including Nexletol, PCSK9i, and Leqvio with preference for injections due to better efficacy. Pt is agreeable to either option but would like to confirm cost ahead of time. Will pursue benefit investigation for Leqvio coverage and also submit prior  authorization for Praluent. Will follow up with pt once copay information is determined. ? ?Johntae Broxterman E. Braedon Sjogren, PharmD, BCACP, CPP ?[id number] [place], [place] [address] ?Phone: [phone]; Fax: [phone] ?04/21/2021 2:34 PM ? ? ? ?

## 2021-04-21 NOTE — Telephone Encounter (Signed)
Praluent PA approved through 01/10/22. Rx sent to pharmacy to see if copay is affordable - anticipate pt has $500 deductible. ?

## 2021-04-21 NOTE — Patient Instructions (Addendum)
Your LDL cholesterol is 124 and your goal is < 70 ? ?I'll look into cost for: ? ?- Praluent injections - given twice monthly at home, lowers LDL cholesterol by 60% ?- Leqvio injections - given at month 0, 3, then every 6 months over at [facility], lowers LDL cholesterol by 50% ?

## 2021-04-29 NOTE — Telephone Encounter (Signed)
Called Leqvio portal for update on coverage. They cannot find any request for the pt. Advised them I faxed over his info twice a week ago. They provided alternative fax for me to send info to. ?

## 2021-05-01 NOTE — Telephone Encounter (Addendum)
Called Leqvio portal again for status update on application. They faxed the benefit form to the wrong fax number. Asked them to resend. ? ?Leqvio covered at 100% including deductible between his Medicare B and his supplement plan. Spoke with pt who would like to start [place]. He is aware he will be called next week to coordinate appt at short stay for his first injection. ?

## 2021-05-04 ENCOUNTER — Other Ambulatory Visit: Payer: Self-pay

## 2021-05-04 DIAGNOSIS — E7800 Pure hypercholesterolemia, unspecified: Secondary | ICD-10-CM

## 2021-05-04 NOTE — Telephone Encounter (Signed)
[facility] called the infusion center and spoke to laverne to schedule appt on 05/18/21 at 10am for 1st leqvio injection I will be sending pt a mychart msg with the following instruction: ?Your Leqvio injection is scheduled on 05/18/21 at 10am. Please arrive at [facility] and enter the hospital through [place] (the [employer]) that's located at [facility] 15 minutes before your scheduled injection time. Walk in to the registration desk and let them know that you are scheduled for an injection in the infusion center. They will register you and take you to your appointment.  ? ?Marion Downer is a subcutaneous injection that will lower your LDL cholesterol by 50%. If this is your first injection, your next injection will be due in 3 months. If this is NOT your first injection, your next injection will be due in 6 months. If you have not heard from [facility] to schedule your next appointment within 2 weeks of your next anticipated injection, please call [facility] to schedule this at: ? ?              [phone] - if you are seen at the [facility] office ?              [phone] - if you are seen at the [facility] office ? ?

## 2021-05-15 ENCOUNTER — Other Ambulatory Visit (HOSPITAL_COMMUNITY): Payer: Self-pay

## 2021-05-18 ENCOUNTER — Encounter (HOSPITAL_COMMUNITY): Payer: Medicare Other

## 2021-05-22 ENCOUNTER — Encounter (HOSPITAL_COMMUNITY)
Admission: RE | Admit: 2021-05-22 | Discharge: 2021-05-22 | Disposition: A | Discharge status: Home / Self Care | Payer: Medicare Other | Source: Ambulatory Visit | Attending: Physician Assistant | Admitting: Physician Assistant

## 2021-05-22 DIAGNOSIS — E7800 Pure hypercholesterolemia, unspecified: Secondary | ICD-10-CM | POA: Insufficient documentation

## 2021-05-22 MED ORDER — INCLISIRAN SODIUM 284 MG/1.5ML ~~LOC~~ SOSY
284.0000 mg | PREFILLED_SYRINGE | Freq: Once | SUBCUTANEOUS | Status: AC
  Administered 2021-05-22: 284 mg via SUBCUTANEOUS

## 2021-05-22 MED ORDER — INCLISIRAN SODIUM 284 MG/1.5ML ~~LOC~~ SOSY
PREFILLED_SYRINGE | SUBCUTANEOUS | Status: AC
  Filled 2021-05-22: qty 1.5

## 2021-05-26 ENCOUNTER — Other Ambulatory Visit: Payer: Self-pay

## 2021-05-26 DIAGNOSIS — I700 Atherosclerosis of aorta: Secondary | ICD-10-CM

## 2021-05-26 DIAGNOSIS — I6501 Occlusion and stenosis of right vertebral artery: Secondary | ICD-10-CM

## 2021-05-26 DIAGNOSIS — E7800 Pure hypercholesterolemia, unspecified: Secondary | ICD-10-CM

## 2021-05-26 DIAGNOSIS — I2510 Atherosclerotic heart disease of native coronary artery without angina pectoris: Secondary | ICD-10-CM

## 2021-06-15 ENCOUNTER — Other Ambulatory Visit: Payer: Self-pay | Admitting: Thoracic Surgery (Cardiothoracic Vascular Surgery)

## 2021-06-15 DIAGNOSIS — C3430 Malignant neoplasm of lower lobe, unspecified bronchus or lung: Secondary | ICD-10-CM

## 2021-06-16 ENCOUNTER — Other Ambulatory Visit: Payer: Self-pay | Admitting: Thoracic Surgery (Cardiothoracic Vascular Surgery)

## 2021-06-16 DIAGNOSIS — C3490 Malignant neoplasm of unspecified part of unspecified bronchus or lung: Secondary | ICD-10-CM

## 2021-06-19 MED ORDER — LISINOPRIL 40 MG PO TABS: 40.0000 mg | ORAL_TABLET | Freq: Every morning | ORAL | 1 refills | Status: DC

## 2021-06-19 MED ORDER — AMLODIPINE BESYLATE 2.5 MG PO TABS: 2.5000 mg | ORAL_TABLET | Freq: Every day | ORAL | 1 refills | Status: DC

## 2021-07-09 MED ORDER — LISINOPRIL 40 MG PO TABS: 20.0000 mg | ORAL_TABLET | Freq: Every morning | ORAL | 1 refills | Status: DC

## 2021-07-09 NOTE — Addendum Note (Signed)
Addended byKathlen Mody, Nicki Reaper T on: 07/09/2021 11:31 AM   Modules accepted: Orders

## 2021-07-22 NOTE — Addendum Note (Signed)
Addended byKathlen Mody, Nicki Reaper T on: 07/22/2021 01:25 PM   Modules accepted: Orders

## 2021-07-30 ENCOUNTER — Other Ambulatory Visit: Payer: Self-pay | Admitting: Physician Assistant

## 2021-07-31 ENCOUNTER — Telehealth: Payer: Medicare Other | Admitting: Thoracic Surgery (Cardiothoracic Vascular Surgery)

## 2021-07-31 ENCOUNTER — Ambulatory Visit
Admission: RE | Admit: 2021-07-31 | Discharge: 2021-07-31 | Disposition: A | Discharge status: Home / Self Care | Payer: Medicare Other | Source: Ambulatory Visit | Attending: Thoracic Surgery (Cardiothoracic Vascular Surgery) | Admitting: Thoracic Surgery (Cardiothoracic Vascular Surgery)

## 2021-07-31 DIAGNOSIS — C3490 Malignant neoplasm of unspecified part of unspecified bronchus or lung: Secondary | ICD-10-CM

## 2021-08-07 ENCOUNTER — Ambulatory Visit (INDEPENDENT_AMBULATORY_CARE_PROVIDER_SITE_OTHER): Payer: Medicare Other | Admitting: Thoracic Surgery (Cardiothoracic Vascular Surgery)

## 2021-08-07 DIAGNOSIS — Z85118 Personal history of other malignant neoplasm of bronchus and lung: Secondary | ICD-10-CM | POA: Diagnosis not present

## 2021-08-07 NOTE — Progress Notes (Signed)
     [address]       [place] [address]             [phone]       Patient: Home Provider: Office Consent for Telemedicine visit obtained.  Today's visit was completed via a real-time telehealth (see specific modality noted below). The patient/authorized person provided oral consent at the time of the visit to engage in a telemedicine encounter with the present provider at [facility]. The patient/authorized person was informed of the potential benefits, limitations, and risks of telemedicine. The patient/authorized person expressed understanding that the laws that protect confidentiality also apply to telemedicine. The patient/authorized person acknowledged understanding that telemedicine does not provide emergency services and that he or she would need to call 911 or proceed to the nearest hospital for help if such a need arose.   Total time spent in the clinical discussion 10 minutes.  Telehealth Modality: Phone visit (audio only)  I had a telephone visit with Mr. Frederick Hall.  He is status post a wedge resection by Dr. Pia Mau in 2020 for stage I non-small cell lung cancer.  He had his surveillance scan on 21 July which showed no significant change to the previously noted pulmonary nodules, and no evidence of recurrent disease.  He was referred to our cancer center for ongoing surveillance.

## 2021-08-12 ENCOUNTER — Telehealth: Payer: Self-pay | Admitting: Internal Medicine

## 2021-08-12 NOTE — Telephone Encounter (Signed)
Scheduled appt per 8/2 referral. Pt is aware of appt date and time. Pt is aware to arrive 15 mins prior to appt time and to bring and updated insurance card. Pt is aware of appt location.

## 2021-08-24 ENCOUNTER — Ambulatory Visit (HOSPITAL_COMMUNITY)
Admission: RE | Admit: 2021-08-24 | Discharge: 2021-08-24 | Disposition: A | Discharge status: Home / Self Care | Payer: Medicare Other | Source: Ambulatory Visit | Attending: Physician Assistant | Admitting: Physician Assistant

## 2021-08-24 DIAGNOSIS — I700 Atherosclerosis of aorta: Secondary | ICD-10-CM | POA: Diagnosis present

## 2021-08-24 DIAGNOSIS — I2510 Atherosclerotic heart disease of native coronary artery without angina pectoris: Secondary | ICD-10-CM | POA: Diagnosis present

## 2021-08-24 DIAGNOSIS — E7800 Pure hypercholesterolemia, unspecified: Secondary | ICD-10-CM | POA: Insufficient documentation

## 2021-08-24 DIAGNOSIS — I2584 Coronary atherosclerosis due to calcified coronary lesion: Secondary | ICD-10-CM | POA: Insufficient documentation

## 2021-08-24 DIAGNOSIS — I6501 Occlusion and stenosis of right vertebral artery: Secondary | ICD-10-CM | POA: Insufficient documentation

## 2021-08-24 MED ORDER — INCLISIRAN SODIUM 284 MG/1.5ML ~~LOC~~ SOSY: 284.0000 mg | PREFILLED_SYRINGE | Freq: Once | SUBCUTANEOUS | Status: AC

## 2021-08-24 MED ORDER — INCLISIRAN SODIUM 284 MG/1.5ML ~~LOC~~ SOSY
PREFILLED_SYRINGE | SUBCUTANEOUS | Status: AC
  Administered 2021-08-24: 284 mg via SUBCUTANEOUS
  Filled 2021-08-24: qty 1.5

## 2021-08-27 ENCOUNTER — Ambulatory Visit: Payer: Medicare Other | Admitting: Physician Assistant

## 2021-09-08 ENCOUNTER — Other Ambulatory Visit: Payer: Self-pay

## 2021-09-08 ENCOUNTER — Inpatient Hospital Stay: Payer: Medicare Other | Attending: Internal Medicine | Admitting: Internal Medicine

## 2021-09-08 ENCOUNTER — Inpatient Hospital Stay: Payer: Medicare Other

## 2021-09-08 VITALS — BP 143/81 | HR 60 | Temp 98.5°F | Wt 185.5 lb

## 2021-09-08 DIAGNOSIS — C3430 Malignant neoplasm of lower lobe, unspecified bronchus or lung: Secondary | ICD-10-CM

## 2021-09-08 DIAGNOSIS — Z79899 Other long term (current) drug therapy: Secondary | ICD-10-CM | POA: Diagnosis not present

## 2021-09-08 DIAGNOSIS — C3490 Malignant neoplasm of unspecified part of unspecified bronchus or lung: Secondary | ICD-10-CM

## 2021-09-08 DIAGNOSIS — Z87891 Personal history of nicotine dependence: Secondary | ICD-10-CM

## 2021-09-08 DIAGNOSIS — Z85828 Personal history of other malignant neoplasm of skin: Secondary | ICD-10-CM | POA: Diagnosis not present

## 2021-09-08 DIAGNOSIS — I10 Essential (primary) hypertension: Secondary | ICD-10-CM | POA: Diagnosis not present

## 2021-09-08 DIAGNOSIS — Z8546 Personal history of malignant neoplasm of prostate: Secondary | ICD-10-CM | POA: Diagnosis not present

## 2021-09-08 DIAGNOSIS — Z85118 Personal history of other malignant neoplasm of bronchus and lung: Secondary | ICD-10-CM

## 2021-09-08 DIAGNOSIS — Z902 Acquired absence of lung [part of]: Secondary | ICD-10-CM

## 2021-09-08 LAB — CBC WITH DIFFERENTIAL (CANCER CENTER ONLY)
Abs Immature Granulocytes: 0.08 10*3/uL — ABNORMAL HIGH (ref 0.00–0.07)
Basophils Absolute: 0 10*3/uL (ref 0.0–0.1)
Basophils Relative: 0 %
Eosinophils Absolute: 0.1 10*3/uL (ref 0.0–0.5)
Eosinophils Relative: 1 %
HCT: 40.6 % (ref 39.0–52.0)
Hemoglobin: 13.3 g/dL (ref 13.0–17.0)
Immature Granulocytes: 1 %
Lymphocytes Relative: 15 %
Lymphs Abs: 1 10*3/uL (ref 0.7–4.0)
MCH: 31.1 pg (ref 26.0–34.0)
MCHC: 32.8 g/dL (ref 30.0–36.0)
MCV: 94.9 fL (ref 80.0–100.0)
Monocytes Absolute: 0.4 10*3/uL (ref 0.1–1.0)
Monocytes Relative: 6 %
Neutro Abs: 5.1 10*3/uL (ref 1.7–7.7)
Neutrophils Relative %: 77 %
Platelet Count: 155 10*3/uL (ref 150–400)
RBC: 4.28 MIL/uL (ref 4.22–5.81)
RDW: 15.2 % (ref 11.5–15.5)
WBC Count: 6.6 10*3/uL (ref 4.0–10.5)
nRBC: 0 % (ref 0.0–0.2)

## 2021-09-08 LAB — CMP (CANCER CENTER ONLY)
ALT: 20 U/L (ref 0–44)
AST: 22 U/L (ref 15–41)
Albumin: 3.9 g/dL (ref 3.5–5.0)
Alkaline Phosphatase: 70 U/L (ref 38–126)
Anion gap: 3 — ABNORMAL LOW (ref 5–15)
BUN: 16 mg/dL (ref 8–23)
CO2: 29 mmol/L (ref 22–32)
Calcium: 9.3 mg/dL (ref 8.9–10.3)
Chloride: 107 mmol/L (ref 98–111)
Creatinine: 0.72 mg/dL (ref 0.61–1.24)
GFR, Estimated: >60 mL/min (ref >60)
Glucose, Bld: 165 mg/dL — ABNORMAL HIGH (ref 70–99)
Potassium: 3.9 mmol/L (ref 3.5–5.1)
Sodium: 139 mmol/L (ref 135–145)
Total Bilirubin: 0.6 mg/dL (ref 0.3–1.2)
Total Protein: 6.2 g/dL — ABNORMAL LOW (ref 6.5–8.1)

## 2021-09-08 NOTE — Progress Notes (Signed)
[name]   [phone]  CONSULT NOTE  REFERRING PHYSICIAN: Dr. Melodie Bouillon  REASON FOR CONSULTATION:  81 years old white male with history of lung cancer.  HPI [name] is a 81 y.o. male with past medical history significant for multiple medical problems including history of hypertension, GERD, benign prostatic hypertrophy, asthma, osteoarthritis, history of prostate cancer managed by Dr. Tammi Klippel as well as history of basal cell carcinoma and squamous cell carcinoma of the skin.  The patient was undergoing treatment for the prostate cancer and he had CT scan of the abdomen pelvis during his management that showed a suspicious groundglass opacity in the right lower lobe.  This was followed by CT super D of the chest on 03/09/2018 and that showed a solid and subsolid groundglass nodule in the right lower lobe that did not change in size from the previous exam.  There was no suspicious lymphadenopathy.  The patient underwent CT-guided core biopsy of the right lower lobe lung opacity on 04/14/2018 and the final pathology ([id number]) showed adenocarcinoma with lipidic pattern.  He was referred to Dr. Servando Snare and on 06/14/2018 he underwent right VATS with right lower lobe segmentectomy and lymph node dissection under the care of Dr. Servando Snare.  The final pathology ([id number]) showed invasive adenocarcinoma, with lipidic predominant (0.7 cm with negative margin and the dissected lymph nodes were negative for malignancy.  The tumor size was 2.1 cm.  There was no evidence for visceral pleural or lymphovascular invasion. The patient was followed by observation and close monitoring by Dr. Servando Snare until he retired.  He was then followed by Dr. Kipp Brood.  His most recent scan of the chest without contrast on 07/31/2021 showed no concerning findings for disease recurrence or metastasis. Dr. Kipp Brood kindly referred the patient to me today for  further evaluation and close monitoring of his condition. When seen today he is feeling fine except for the general aches and pains with his age.  He denied having any chest pain, shortness breath, cough or hemoptysis.  He has no nausea, vomiting, diarrhea or constipation.  He has no weight loss or night sweats. Family history significant for mother with lung cancer with brain metastasis.  Father had liposarcoma. The patient is married and has 2 children a son and daughter.  He used to work in [employer].  He has a history for smoking for around 15 years but quit 4 years ago.  He drinks alcohol occasionally and no history of drug abuse.  HPI  Past Medical History:  Diagnosis Date   Arthritis    Asthma 2005   "attack x 1"   BCC (basal cell carcinoma of skin) 04/15/2008   Right Forehead   BCC (basal cell carcinoma of skin) 08/26/2016   Mid Forehead   BPH (benign prostatic hyperplasia)    Erectile dysfunction    GERD (gastroesophageal reflux disease)    Headache    "Stabbing"   Hematuria 12/2016   History of acute prostatitis 12/2016   Hypertension    Lipoma 02/08/2014   Small subcutaneous mass right temporal region, noted on MRI Brain, pt unaware   Nodular basal cell carcinoma (BCC) 07/20/2016   Mid Forehead   Nodular basal cell carcinoma (BCC) 05/26/2017   Left Nare   Nodule of right lung 08/26/2017   23mm ground glass posterior nodule, noted on CT chest   Nodulo-ulcerative basal cell carcinoma (BCC) 10/21/2016   Right Shin   Osteoarthritis  of left hip 08/07/2012   Osteoarthritis of right hip 05/23/2012   Prostate cancer ([place])    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 Donzetta Matters) 01/2021 - Plan f/u CTA in 1 year   Renal cyst, right 06/27/2017   Large peripelvic, noted on [facility] Bone scan   SCC (squamous cell carcinoma) 01/27/2009   Left Outer Brow -  Well Diff   Seasonal allergies    Skin cancer    Squamous cell carcinoma in situ (SCCIS) 07/27/2013   Left Forehead   Superficial basal cell carcinoma (BCC) 10/14/2014   Tip of Nose   Wears hearing aid     Past Surgical History:  Procedure Laterality Date   COLONOSCOPY  09/2017   CYSTOSCOPY N/A 09/29/2017   Procedure: CYSTOSCOPY;  Surgeon: Franchot Gallo, MD;  [facility];  Service: Urology;  Laterality: N/A;  no seeds in bladder per Dr Trinna Post NERVE BLOCK  06/14/2018   Procedure: Intercostal Nerve Block;  Surgeon: [name], MD;  Location: [facility];  Service: Thoracic;;   KNEE ARTHROSCOPY Left    LIPOMA EXCISION     multiple   LYMPH NODE DISSECTION  06/14/2018   Procedure: Lymph Node Dissection;  Surgeon: [name], MD;  Location: [facility];  Service: Thoracic;;   RADIOACTIVE SEED IMPLANT N/A 09/29/2017   Procedure: RADIOACTIVE SEED IMPLANT/BRACHYTHERAPY IMPLANT;  Surgeon: Franchot Gallo, MD;  Location[facility];  Service: Urology;  Laterality: N/A;  63 seeds   SHOULDER ARTHROSCOPY Right    SPACE OAR INSTILLATION N/A 09/29/2017   Procedure: SPACE OAR INSTILLATION;  Surgeon: Franchot Gallo, MD;  Location: [facility];  Service: Urology;  Laterality: N/A;   TONSILLECTOMY     TOTAL HIP ARTHROPLASTY Right 05/23/2012   Procedure: TOTAL HIP ARTHROPLASTY;  Surgeon: Johnny Bridge, MD;  Location: [facility];  Service: Orthopedics;  Laterality: Right;   TOTAL HIP ARTHROPLASTY Left 08/07/2012   Procedure: TOTAL HIP ARTHROPLASTY- left ;  Surgeon: Johnny Bridge, MD;  Location: [facility];  Service: Orthopedics;  Laterality: Left;   VIDEO ASSISTED THORACOSCOPY (VATS)/WEDGE RESECTION Right 06/14/2018   Procedure: VIDEO ASSISTED THORACOSCOPY (VATS) WITH LUNG RESECTION OF SUPERIOR SEGMENT OF RIGHT LOWER LOBE;  Surgeon: [name], MD;  Location: [facility];  Service: Thoracic;  Laterality: Right;    Family History   Problem Relation Age of Onset   Cancer Mother    Cancer Father     Social History Social History   Tobacco Use   Smoking status: Former    Packs/day: 1.00    Years: 15.00    Total pack years: 15.00    Types: Cigarettes    Quit date: 05/18/1972    Years since quitting: 49.3   Smokeless tobacco: Never   Tobacco comments:    social alcohol  Vaping Use   Vaping Use: Never used  Substance Use Topics   Alcohol use: Yes    Alcohol/week: 7.0 standard drinks of alcohol    Types: 7 Standard drinks or equivalent per week    Comment: 3  wine or beer   Drug use: No    Allergies  Allergen Reactions   Penicillins Anaphylaxis, Itching and Rash    Childhood allergy. Tolerated cefazolin 05/23/12. Did it involve swelling of the face/tongue/throat, SOB, or low BP? Yes Did it involve sudden  or severe rash/hives, skin peeling, or any reaction on the inside of your mouth or nose? No Did you need to seek medical attention at a hospital or doctor's office? Yes When did it last happen?      childhood allergy If all above answers are "NO", may proceed with cephalosporin use.   Other reaction(s): Other Childhood allergy. Tolerated cefazolin 05/23/12. Did it involve swelling of the face/tongue/throat, SOB, or low BP? Yes Did it involve sudden or severe rash/hives, skin peeling, or any reaction on the inside of your mouth or nose? No Did you need to seek medical attention at a hospital or doctor's office? Yes When did it last happen?      childhood allergy If all above answers are "NO", may proceed with cephalosporin use. un   Cefuroxime Axetil Diarrhea   Other     general anesthesia - severe constipation     Current Outpatient Medications  Medication Sig Dispense Refill   acetaminophen (TYLENOL) 500 MG tablet Take 500-1,000 mg by mouth every 8 (eight) hours as needed for moderate pain or headache.     aspirin 81 MG EC tablet      CALCIUM-MAGNESIUM-VITAMIN D PO Take 2 tablets by mouth daily.      ezetimibe (ZETIA) 10 MG tablet TAKE ONE TABLET BY MOUTH ONE TIME DAILY 90 tablet 1   famotidine (PEPCID) 20 MG tablet Take 20 mg by mouth daily before breakfast.      lisinopril (ZESTRIL) 40 MG tablet Take 0.5 tablets (20 mg total) by mouth every morning. 90 tablet 1   Multiple Vitamin (MULTIVITAMIN WITH MINERALS) TABS tablet Take 1 tablet by mouth daily.     Multiple Vitamins-Minerals (ZINC PO) Take 15 mg by mouth daily.      naproxen sodium (ALEVE) 220 MG tablet Take 220 mg by mouth 2 (two) times daily as needed (heavy activity (golf, exercise)).      phenylephrine (NEO-SYNEPHRINE) 1 % nasal spray Place 1 drop into both nostrils every 6 (six) hours as needed for congestion.     senna (SENOKOT) 8.6 MG TABS tablet Take 1 tablet by mouth daily.     vitamin C (ASCORBIC ACID) 500 MG tablet Take 500 mg by mouth daily.     No current facility-administered medications for this visit.    Review of Systems  Constitutional: negative Eyes: negative Ears, nose, mouth, throat, and face: negative Respiratory: negative Cardiovascular: negative Gastrointestinal: negative Genitourinary:negative Integument/breast: negative Hematologic/lymphatic: negative Musculoskeletal:positive for arthralgias Neurological: negative Behavioral/Psych: negative Endocrine: negative Allergic/Immunologic: negative  Physical Exam  HGD:JMEQA, healthy, no distress, well nourished, and well developed SKIN: skin color, texture, turgor are normal, no rashes or significant lesions HEAD: Normocephalic, No masses, lesions, tenderness or abnormalities EYES: normal, PERRLA, Conjunctiva are pink and non-injected EARS: External ears normal, Canals clear OROPHARYNX:no exudate, no erythema, and lips, buccal mucosa, and tongue normal  NECK: supple, no adenopathy, no JVD LYMPH:  no palpable lymphadenopathy, no hepatosplenomegaly LUNGS: clear to auscultation , and palpation HEART: regular rate & rhythm, no murmurs, and no  gallops ABDOMEN:abdomen soft, non-tender, normal bowel sounds, and no masses or organomegaly BACK: Back symmetric, no curvature., No CVA tenderness EXTREMITIES:no joint deformities, effusion, or inflammation, no edema  NEURO: alert & oriented x 3 with fluent speech, no focal motor/sensory deficits  PERFORMANCE STATUS: ECOG 1  LABORATORY DATA: Lab Results  Component Value Date   WBC 6.6 09/08/2021   HGB 13.3 09/08/2021   HCT 40.6 09/08/2021   MCV 94.9 09/08/2021  PLT 155 09/08/2021      Chemistry      Component Value Date/Time   NA 140 03/24/2021 0738   K 4.2 03/24/2021 0738   CL 104 03/24/2021 0738   CO2 25 03/24/2021 0738   BUN 12 03/24/2021 0738   CREATININE 0.82 03/24/2021 0738      Component Value Date/Time   CALCIUM 9.1 03/24/2021 0738   ALKPHOS 76 03/24/2021 0738   AST 22 03/24/2021 0738   ALT 21 03/24/2021 0738   BILITOT 0.6 03/24/2021 0738       RADIOGRAPHIC STUDIES: No results found.  ASSESSMENT: This is a very pleasant 81 years old white male diagnosed with stage IA (T1c, N0, M0) non-small cell lung cancer, adenocarcinoma with predominant lipidic component status post right lower lobe superior segmentectomy with lymph node dissection under the care of Dr. Servando Snare on July 11, 2018.   PLAN: I had a lengthy discussion with the patient today about his current condition and further management. I personally and independently reviewed the previous imaging studies as well as the pathology reports. His last CT scan of the chest on July 31, 2021 showed no concerning findings for disease recurrence or metastasis. I recommended for the patient to continue on observation with repeat CT scan of the chest in 1 year. There is no benefit for any additional treatment for the patient with stage Ia non-small cell lung cancer and we will continue with observation and close monitoring for now. The patient was advised to call immediately if he has any other concerning symptoms  in the interval. The patient voices understanding of current disease status and treatment options and is in agreement with the current care plan.  All questions were answered. The patient knows to call the clinic with any problems, questions or concerns. We can certainly see the patient much sooner if necessary.  Thank you so much for allowing me to participate in the care of Frederick Hall. I will continue to follow up the patient with you and assist in his care.  The total time spent in the appointment was 60 minutes.  Disclaimer: This note was dictated with voice recognition software. Similar sounding words can inadvertently be transcribed and may not be corrected upon review.   Eilleen Kempf September 08, 2021, 2:30 PM

## 2021-09-28 DIAGNOSIS — I2510 Atherosclerotic heart disease of native coronary artery without angina pectoris: Secondary | ICD-10-CM

## 2021-09-28 DIAGNOSIS — E7800 Pure hypercholesterolemia, unspecified: Secondary | ICD-10-CM

## 2021-09-28 NOTE — Telephone Encounter (Signed)
Do you guys have a lab set up for him to recheck his Lipids after his 2nd injection? Thanks [employer]

## 2021-09-30 ENCOUNTER — Ambulatory Visit: Payer: Medicare Other | Attending: Physician Assistant

## 2021-09-30 DIAGNOSIS — E7800 Pure hypercholesterolemia, unspecified: Secondary | ICD-10-CM | POA: Insufficient documentation

## 2021-09-30 DIAGNOSIS — I2584 Coronary atherosclerosis due to calcified coronary lesion: Secondary | ICD-10-CM | POA: Insufficient documentation

## 2021-09-30 DIAGNOSIS — I2510 Atherosclerotic heart disease of native coronary artery without angina pectoris: Secondary | ICD-10-CM | POA: Insufficient documentation

## 2021-10-01 LAB — APOLIPOPROTEIN B: Apolipoprotein B: 65 mg/dL (ref <90)

## 2021-10-01 LAB — LIPID PANEL
Chol/HDL Ratio: 2.2 ratio (ref 0.0–5.0)
Cholesterol, Total: 153 mg/dL (ref 100–199)
HDL: 69 mg/dL (ref >39)
LDL Chol Calc (NIH): 69 mg/dL (ref 0–99)
Triglycerides: 82 mg/dL (ref 0–149)
VLDL Cholesterol Cal: 15 mg/dL (ref 5–40)

## 2021-11-24 ENCOUNTER — Other Ambulatory Visit: Payer: Self-pay | Admitting: Pharmacist

## 2021-11-24 DIAGNOSIS — I2510 Atherosclerotic heart disease of native coronary artery without angina pectoris: Secondary | ICD-10-CM

## 2021-11-24 DIAGNOSIS — E7800 Pure hypercholesterolemia, unspecified: Secondary | ICD-10-CM

## 2021-11-24 DIAGNOSIS — I700 Atherosclerosis of aorta: Secondary | ICD-10-CM

## 2021-12-18 ENCOUNTER — Other Ambulatory Visit: Payer: Self-pay | Admitting: Cardiovascular Disease

## 2021-12-22 ENCOUNTER — Encounter: Payer: Self-pay | Admitting: Physician Assistant

## 2021-12-22 ENCOUNTER — Ambulatory Visit: Payer: Medicare Other | Attending: Physician Assistant | Admitting: Physician Assistant

## 2021-12-22 VITALS — BP 146/90 | HR 63 | Ht 69.0 in | Wt 187.6 lb

## 2021-12-22 DIAGNOSIS — I700 Atherosclerosis of aorta: Secondary | ICD-10-CM

## 2021-12-22 DIAGNOSIS — I2510 Atherosclerotic heart disease of native coronary artery without angina pectoris: Secondary | ICD-10-CM | POA: Diagnosis not present

## 2021-12-22 DIAGNOSIS — I6501 Occlusion and stenosis of right vertebral artery: Secondary | ICD-10-CM

## 2021-12-22 DIAGNOSIS — E7800 Pure hypercholesterolemia, unspecified: Secondary | ICD-10-CM | POA: Diagnosis not present

## 2021-12-22 DIAGNOSIS — I720 Aneurysm of carotid artery: Secondary | ICD-10-CM | POA: Diagnosis present

## 2021-12-22 DIAGNOSIS — I10 Essential (primary) hypertension: Secondary | ICD-10-CM

## 2021-12-22 DIAGNOSIS — I2584 Coronary atherosclerosis due to calcified coronary lesion: Secondary | ICD-10-CM

## 2021-12-22 NOTE — Assessment & Plan Note (Signed)
He tends to have high BP in the office. His BPs at home had increased recently and he increased his Lisinopril back to 40 mg once daily. He is not eating as well as he was since Thanksgiving and leading up to Christmas. He plans to get back into his usual exercise/diet routine after the first of the year. His BPs at home have been 120s-130s/80s since adjusting his Lisinopril. F/u 1 year.

## 2021-12-22 NOTE — Assessment & Plan Note (Signed)
Statin intolerant. LDL optimal on most recent lab work.  Continue current Rx with Zetia, Inclisiran Marion Downer).

## 2021-12-22 NOTE — Assessment & Plan Note (Signed)
Asymptomatic. 

## 2021-12-22 NOTE — Assessment & Plan Note (Signed)
Evaluated by Dr. Donzetta Matters last year. Plan is to repeat a scan again this year.

## 2021-12-22 NOTE — Patient Instructions (Signed)
Medication Instructions:  Your physician recommends that you continue on your current medications as directed. Please refer to the Current Medication list given to you today.  *If you need a refill on your cardiac medications before your next appointment, please call your pharmacy*   Lab Work: None ordered  If you have labs (blood work) drawn today and your tests are completely normal, you will receive your results only by: [facility] (if you have MyChart) OR A paper copy in the mail If you have any lab test that is abnormal or we need to change your treatment, we will call you to review the results.   Testing/Procedures: None ordered   Follow-Up: At [facility], you and your health needs are our priority.  As part of our continuing mission to provide you with exceptional heart care, we have created designated Provider Care Teams.  These Care Teams include your primary Cardiologist (physician) and Advanced Practice Providers (APPs -  Physician Assistants and Nurse Practitioners) who all work together to provide you with the care you need, when you need it.  We recommend signing up for the patient portal called "MyChart".  Sign up information is provided on this After Visit Summary.  MyChart is used to connect with patients for Virtual Visits (Telemedicine).  Patients are able to view lab/test results, encounter notes, upcoming appointments, etc.  Non-urgent messages can be sent to your provider as well.   To learn more about what you can do with MyChart, go to [email or web address].    Your next appointment:   1 year(s)  The format for your next appointment:   In Person  Provider:   [name], PA-C         Other Instructions   Important Information About Sugar

## 2021-12-22 NOTE — Progress Notes (Signed)
Cardiology Office Note:    Date:  12/22/2021   ID:  Frederick Hall, DOB Jun 09, 1940, MRN [id number]  PCP:  [name], MD  [facility] Cardiologist:  Sherren Mocha, MD Cardiology APP:  [facility]     Referring MD: [name], MD   Chief Complaint:  F/u for Coronary Ca2+, HTN, Hyperlipidemia    Patient Profile: Coronary artery Ca2+ (CT in 07/2020) Aortic atherosclerosis  Cerebrovascular disease CTA 01/09/21: R VA 100, L VA patent, 5 mm RICA pseudoaneurysm  Eval by [facility] 01/2021 (Dr. Donzetta Matters) >> rpt CT planned in 01/2022 Aortic atherosclerosis  Hypertension  Hyperlipidemia  Cardiomegaly  TTE 02/15/19: EF 60-65, mild LVH, gr 1 DD, no RWMA, normal RVSF, mod LAE, mild TR, mild AI, AV sclerosis w/o AS, mildly elevated PASP, RVSP 31.5  Lung CA s/p VATS w RLL resection in 2020 Ex smoker Prostate CA Rx: Lupron  BPH GERD     History of Present Illness:   Frederick Hall is a 81 y.o. male with the above problem list.  He was last seen 12/17/20. He returns for f/u. He is here alone. He is doing well. He has not had chest pain, shortness of breath, syncope, leg edema, orthopnea.       EKG:  normal sinus rhythm, HR 63, normal axis, non-specific ST-TW changes, QTc 437 ms    Reviewed and updated this encounter:  Tobacco  Allergies  Meds  Problems  Med Hx  Surg Hx  Fam Hx     ROS  Labs/Other Test Reviewed:   Recent Labs: 09/08/2021: ALT 20; BUN 16; Creatinine 0.72; Hemoglobin 13.3; Platelet Count 155; Potassium 3.9; Sodium 139   Recent Lipid Panel Recent Labs    09/30/21 0938  CHOL 153  TRIG 82  HDL 69  LDLCALC 69    Risk Assessment/Calculations/Metrics:         HYPERTENSION CONTROL Vitals:   12/22/21 1352 12/22/21 1432  BP: (!) 148/80 (!) 146/90    The patient's blood pressure is elevated above target today.  In order to address the patient's elevated BP: The blood pressure is usually elevated in clinic.   Blood pressures monitored at home have been optimal.      Physical Exam:   VS:  BP (!) 146/90   Pulse 63   Ht 5\' 9"  (1.753 m)   Wt 187 lb 9.6 oz (85.1 kg)   SpO2 98%   BMI 27.70 kg/m    Wt Readings from Last 3 Encounters:  12/22/21 187 lb 9.6 oz (85.1 kg)  09/08/21 185 lb 8 oz (84.1 kg)  01/28/21 202 lb (91.6 kg)    Constitutional:      Appearance: Healthy appearance. Not in distress.  Neck:     Vascular: No carotid bruit. JVD normal.  Pulmonary:     Effort: Pulmonary effort is normal.     Breath sounds: No wheezing. No rales.  Cardiovascular:     Normal rate. Regular rhythm. Normal S1. Normal S2.      Murmurs: There is no murmur.  Edema:    Peripheral edema absent.  Abdominal:     Palpations: There is no hepatomegaly.          ASSESSMENT & PLAN:   Essential hypertension He tends to have high BP in the office. His BPs at home had increased recently and he increased his Lisinopril back to 40 mg once daily. He is not eating as well as he was  since Thanksgiving and leading up to Christmas. He plans to get back into his usual exercise/diet routine after the first of the year. His BPs at home have been 120s-130s/80s since adjusting his Lisinopril. F/u 1 year.   Aortic atherosclerosis (HCC) Continue ASA, Zetia, Inclisiran.  Pure hypercholesterolemia Statin intolerant. LDL optimal on most recent lab work.  Continue current Rx with Zetia, Inclisiran Marion Downer).   Coronary artery calcification He is not having symptoms of angina. Continue ASA 81 mg once daily, Zetia 10 mg once daily, Leqvio. He is statin intol. F/u 1 year.   Stenosis of right vertebral artery Asymptomatic.   Pseudoaneurysm of right internal carotid artery ([place]) Evaluated by Dr. Donzetta Matters last year. Plan is to repeat a scan again this year.           Dispo:  Return in about 1 year (around 12/23/2022) for Routine Follow Up, w/ [name], PA-C.  Medication Adjustments/Labs and Tests Ordered: Current medicines  are reviewed at length with the patient today.  Concerns regarding medicines are outlined above.  Tests Ordered: Orders Placed This Encounter  Procedures   EKG 12-Lead   Medication Changes: No orders of the defined types were placed in this encounter.  Signed, [name], PA-C  12/22/2021 2:39 PM    [address], [place], [place]  [address] Phone: [phone]; Fax: [phone]

## 2021-12-22 NOTE — Assessment & Plan Note (Signed)
Continue ASA, Zetia, Inclisiran.

## 2021-12-22 NOTE — Assessment & Plan Note (Signed)
He is not having symptoms of angina. Continue ASA 81 mg once daily, Zetia 10 mg once daily, Leqvio. He is statin intol. F/u 1 year.

## 2022-01-09 ENCOUNTER — Encounter: Payer: Self-pay | Admitting: Vascular Surgery

## 2022-01-23 ENCOUNTER — Other Ambulatory Visit: Payer: Self-pay | Admitting: Cardiovascular Disease

## 2022-02-01 ENCOUNTER — Other Ambulatory Visit: Payer: Self-pay | Admitting: Cardiovascular Disease

## 2022-02-22 ENCOUNTER — Other Ambulatory Visit (HOSPITAL_COMMUNITY): Payer: Self-pay | Admitting: *Deleted

## 2022-02-23 ENCOUNTER — Ambulatory Visit (HOSPITAL_COMMUNITY)
Admission: RE | Admit: 2022-02-23 | Discharge: 2022-02-23 | Disposition: A | Discharge status: Home / Self Care | Payer: Medicare Other | Source: Ambulatory Visit | Attending: Physician Assistant | Admitting: Physician Assistant

## 2022-02-23 DIAGNOSIS — I700 Atherosclerosis of aorta: Secondary | ICD-10-CM | POA: Insufficient documentation

## 2022-02-23 DIAGNOSIS — I2584 Coronary atherosclerosis due to calcified coronary lesion: Secondary | ICD-10-CM | POA: Diagnosis present

## 2022-02-23 DIAGNOSIS — I2510 Atherosclerotic heart disease of native coronary artery without angina pectoris: Secondary | ICD-10-CM | POA: Diagnosis present

## 2022-02-23 MED ORDER — INCLISIRAN SODIUM 284 MG/1.5ML ~~LOC~~ SOSY
284.0000 mg | PREFILLED_SYRINGE | Freq: Once | SUBCUTANEOUS | Status: AC
  Administered 2022-02-23: 284 mg via SUBCUTANEOUS

## 2022-02-23 MED ORDER — INCLISIRAN SODIUM 284 MG/1.5ML ~~LOC~~ SOSY
PREFILLED_SYRINGE | SUBCUTANEOUS | Status: AC
  Filled 2022-02-23: qty 1.5

## 2022-02-25 ENCOUNTER — Other Ambulatory Visit: Payer: Self-pay

## 2022-02-25 DIAGNOSIS — I720 Aneurysm of carotid artery: Secondary | ICD-10-CM

## 2022-02-25 NOTE — Addendum Note (Signed)
Addended byDoylene Bode on: 02/25/2022 11:34 AM   Modules accepted: Orders

## 2022-02-26 ENCOUNTER — Other Ambulatory Visit: Payer: Self-pay | Admitting: Pharmacist

## 2022-02-26 DIAGNOSIS — I2510 Atherosclerotic heart disease of native coronary artery without angina pectoris: Secondary | ICD-10-CM

## 2022-02-26 DIAGNOSIS — I671 Cerebral aneurysm, nonruptured: Secondary | ICD-10-CM

## 2022-02-26 DIAGNOSIS — E7800 Pure hypercholesterolemia, unspecified: Secondary | ICD-10-CM

## 2022-03-15 ENCOUNTER — Ambulatory Visit (HOSPITAL_COMMUNITY)
Admission: RE | Admit: 2022-03-15 | Discharge: 2022-03-15 | Disposition: A | Discharge status: Home / Self Care | Payer: Medicare Other | Source: Ambulatory Visit | Attending: Vascular Surgery | Admitting: Vascular Surgery

## 2022-03-15 DIAGNOSIS — I720 Aneurysm of carotid artery: Secondary | ICD-10-CM | POA: Insufficient documentation

## 2022-03-15 MED ORDER — IOHEXOL 350 MG/ML SOLN
75.0000 mL | Freq: Once | INTRAVENOUS | Status: AC | PRN
  Administered 2022-03-15: 75 mL via INTRAVENOUS

## 2022-03-18 ENCOUNTER — Encounter: Payer: Self-pay | Admitting: Vascular Surgery

## 2022-04-01 ENCOUNTER — Other Ambulatory Visit: Payer: Self-pay

## 2022-04-01 DIAGNOSIS — I720 Aneurysm of carotid artery: Secondary | ICD-10-CM

## 2022-04-02 ENCOUNTER — Ambulatory Visit (HOSPITAL_COMMUNITY)
Admission: RE | Admit: 2022-04-02 | Discharge: 2022-04-02 | Disposition: A | Discharge status: Home / Self Care | Payer: Medicare Other | Source: Ambulatory Visit | Attending: Vascular Surgery | Admitting: Vascular Surgery

## 2022-04-02 DIAGNOSIS — I720 Aneurysm of carotid artery: Secondary | ICD-10-CM | POA: Diagnosis present

## 2022-04-02 MED ORDER — IOHEXOL 350 MG/ML SOLN
75.0000 mL | Freq: Once | INTRAVENOUS | Status: AC | PRN
  Administered 2022-04-02: 75 mL via INTRAVENOUS

## 2022-04-02 MED ORDER — SODIUM CHLORIDE (PF) 0.9 % IJ SOLN
INTRAMUSCULAR | Status: AC
  Filled 2022-04-02: qty 50

## 2022-04-14 ENCOUNTER — Ambulatory Visit (INDEPENDENT_AMBULATORY_CARE_PROVIDER_SITE_OTHER): Payer: Medicare Other | Admitting: Vascular Surgery

## 2022-04-14 ENCOUNTER — Encounter: Payer: Self-pay | Admitting: Vascular Surgery

## 2022-04-14 VITALS — BP 132/80 | HR 58 | Temp 98.0°F | Resp 20 | Ht 69.0 in | Wt 193.7 lb

## 2022-04-14 DIAGNOSIS — I720 Aneurysm of carotid artery: Secondary | ICD-10-CM | POA: Diagnosis not present

## 2022-04-14 NOTE — Progress Notes (Signed)
Patient ID: Frederick Hall, male   DOB: [birth year], 82 y.o.   MRN: [id number]  Reason for Consult: Follow-up   Referred by [name], MD  Subjective:     HPI:  Frederick Hall is a 82 y.o. 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  These were initially found incidentally after MRI which were performed for hearing loss demonstrated vertebral artery occlusion and subsequent CTA of the head and neck were found.  He does have hearing aids does not have any headaches has no new symptoms has been followed for previous prostate and lung cancer.  Past Medical History:  Diagnosis Date   Arthritis    Asthma 2005   "attack x 1"   BCC (basal cell carcinoma of skin) 04/15/2008   Right Forehead   BCC (basal cell carcinoma of skin) 08/26/2016   Mid Forehead   BPH (benign prostatic hyperplasia)    Erectile dysfunction    GERD (gastroesophageal reflux disease)    Headache    "Stabbing"   Hematuria 12/2016   History of acute prostatitis 12/2016   Hypertension    Lipoma 02/08/2014   Small subcutaneous mass right temporal region, noted on MRI Brain, pt unaware   Nodular basal cell carcinoma (BCC) 07/20/2016   Mid Forehead   Nodular basal cell carcinoma (BCC) 05/26/2017   Left Nare   Nodule of right lung 08/26/2017   26mm ground glass posterior nodule, noted on CT chest   Nodulo-ulcerative basal cell carcinoma (BCC) 10/21/2016   Right Shin   Osteoarthritis of left hip 08/07/2012   Osteoarthritis of right hip 05/23/2012   Prostate cancer    Pseudoaneurysm of carotid artery 01/29/2021   Head and Neck CTA 12/22:   5 mm pseudoaneurysm of the right internal carotid artery >> Eval w [facility] (Dr. Donzetta Matters) 01/2021 - Plan f/u CTA in 1 year   Renal cyst, right 06/27/2017   Large peripelvic, noted on NM Bone scan   SCC (squamous cell carcinoma) 01/27/2009   Left Outer Brow - Well Diff   Seasonal allergies     Skin cancer    Squamous cell carcinoma in situ (SCCIS) 07/27/2013   Left Forehead   Superficial basal cell carcinoma (BCC) 10/14/2014   Tip of Nose   Wears hearing aid    Family History  Problem Relation Age of Onset   Cancer Mother    Cancer Father    Past Surgical History:  Procedure Laterality Date   COLONOSCOPY  09/2017   CYSTOSCOPY N/A 09/29/2017   Procedure: CYSTOSCOPY;  Surgeon: Franchot Gallo, MD;  Location: [facility];  Service: Urology;  Laterality: N/A;  no seeds in bladder per Dr Trinna Post NERVE BLOCK  06/14/2018   Procedure: Intercostal Nerve Block;  Surgeon: [name], MD;  Location: [place];  Service: Thoracic;;   KNEE ARTHROSCOPY Left    LIPOMA EXCISION     multiple   LYMPH NODE DISSECTION  06/14/2018   Procedure: Lymph Node Dissection;  Surgeon: [name], MD;  Location: [place];  Service: Thoracic;;   RADIOACTIVE SEED IMPLANT N/A 09/29/2017   Procedure: RADIOACTIVE SEED IMPLANT/BRACHYTHERAPY IMPLANT;  Surgeon: Franchot Gallo, MD;  Location: [facility];  Service: Urology;  Laterality: N/A;  63 seeds   SHOULDER ARTHROSCOPY Right    SPACE OAR INSTILLATION N/A 09/29/2017   Procedure: SPACE OAR INSTILLATION;  Surgeon: Franchot Gallo, MD;  Location: [place];  Service: Urology;  Laterality: N/A;   TONSILLECTOMY     TOTAL HIP ARTHROPLASTY Right 05/23/2012   Procedure: TOTAL HIP ARTHROPLASTY;  Surgeon: Johnny Bridge, MD;  Location: [facility];  Service: Orthopedics;  Laterality: Right;   TOTAL HIP ARTHROPLASTY Left 08/07/2012   Procedure: TOTAL HIP ARTHROPLASTY- left ;  Surgeon: Johnny Bridge, MD;  Location: [place];  Service: Orthopedics;  Laterality: Left;   VIDEO ASSISTED THORACOSCOPY (VATS)/WEDGE RESECTION Right 06/14/2018   Procedure: VIDEO ASSISTED THORACOSCOPY (VATS) WITH LUNG RESECTION OF SUPERIOR SEGMENT OF RIGHT LOWER LOBE;  Surgeon: [name], MD;  [facility];   Service: Thoracic;  Laterality: Right;    Short Social History:  Social History   Tobacco Use   Smoking status: Former    Packs/day: 1.00    Years: 15.00    Additional pack years: 0.00    Total pack years: 15.00    Types: Cigarettes    Quit date: 05/18/1972    Years since quitting: 49.9   Smokeless tobacco: Never   Tobacco comments:    social alcohol  Substance Use Topics   Alcohol use: Yes    Alcohol/week: 7.0 standard drinks of alcohol    Types: 7 Standard drinks or equivalent per week    Comment: 3  wine or beer    Allergies  Allergen Reactions   Penicillins Anaphylaxis, Itching and Rash    Childhood allergy. Tolerated cefazolin 05/23/12. Did it involve swelling of the face/tongue/throat, SOB, or low BP? Yes Did it involve sudden or severe rash/hives, skin peeling, or any reaction on the inside of your mouth or nose? No Did you need to seek medical attention at a hospital or doctor's office? Yes When did it last happen?      childhood allergy If all above answers are "NO", may proceed with cephalosporin use.   Other reaction(s): Other Childhood allergy. Tolerated cefazolin 05/23/12. Did it involve swelling of the face/tongue/throat, SOB, or low BP? Yes Did it involve sudden or severe rash/hives, skin peeling, or any reaction on the inside of your mouth or nose? No Did you need to seek medical attention at a hospital or doctor's office? Yes When did it last happen?      childhood allergy If all above answers are "NO", may proceed with cephalosporin use. un   Cefuroxime Axetil Diarrhea   Other     general anesthesia - severe constipation     Current Outpatient Medications  Medication Sig Dispense Refill   acetaminophen (TYLENOL) 500 MG tablet Take 500-1,000 mg by mouth every 8 (eight) hours as needed for moderate pain or headache.     aspirin 81 MG EC tablet      CALCIUM-MAGNESIUM-VITAMIN D PO Take 2 tablets by mouth daily.     ezetimibe (ZETIA) 10 MG tablet TAKE  ONE TABLET BY MOUTH ONE TIME DAILY 90 tablet 1   famotidine (PEPCID) 20 MG tablet Take 20 mg by mouth daily before breakfast.      Inclisiran Sodium (LEQVIO [place]) Inject 284 mg into the skin every 6 (six) months.     lisinopril (ZESTRIL) 40 MG tablet Take 1 tablet (40 mg total) by mouth daily. 90 tablet 3   Multiple Vitamin (MULTIVITAMIN WITH MINERALS) TABS tablet Take 1 tablet by mouth daily.     Multiple Vitamins-Minerals (ZINC PO) Take 15 mg by mouth daily.      naproxen sodium (ALEVE) 220 MG tablet Take 220 mg by mouth 2 (two)  times daily as needed (heavy activity (golf, exercise)).      phenylephrine (NEO-SYNEPHRINE) 1 % nasal spray Place 1 drop into both nostrils every 6 (six) hours as needed for congestion.     senna (SENOKOT) 8.6 MG TABS tablet Take 1 tablet by mouth daily.     vitamin C (ASCORBIC ACID) 500 MG tablet Take 500 mg by mouth daily.     No current facility-administered medications for this visit.    Review of Systems  Constitutional:  Constitutional negative. HENT:       Hearing loss Eyes: Eyes negative.  Respiratory: Respiratory negative.  GI: Gastrointestinal negative.  Musculoskeletal: Musculoskeletal negative.  Neurological: Neurological negative. Hematologic: Hematologic/lymphatic negative.  Psychiatric: Psychiatric negative.        Objective:  Objective   Vitals:   04/14/22 0835  BP: (!) 152/88  Pulse: (!) 58  Resp: 20  Temp: 98 F (36.7 C)  SpO2: 96%  Weight: 193 lb 11.2 oz (87.9 kg)  Height: 5\' 9"  (1.753 m)   Body mass index is 28.6 kg/m.  Physical Exam HENT:     Head: Normocephalic.     Nose: Nose normal.  Eyes:     Pupils: Pupils are equal, round, and reactive to light.  Cardiovascular:     Rate and Rhythm: Normal rate.  Abdominal:     General: Abdomen is flat.     Palpations: Abdomen is soft.  Musculoskeletal:        General: Normal range of motion.     Cervical back: Normal range of motion and neck supple.     Right lower leg:  No edema.     Left lower leg: No edema.  Skin:    General: Skin is warm.     Capillary Refill: Capillary refill takes less than 2 seconds.  Neurological:     General: No focal deficit present.     Mental Status: He is alert.  Psychiatric:        Mood and Affect: Mood normal.        Behavior: Behavior normal.        Thought Content: Thought content normal.        Judgment: Judgment normal.     Data: CTA IMPRESSION: 1. 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 2. Unchanged fusiform aneurysm of the distal left cervical ICA, measuring up to 10 mm. 3. Unchanged chronic occlusion of the right vertebral artery from its origin with reconstitution of the V4 segment, likely via retrograde flow. 4.  Aortic Atherosclerosis (ICD10-I70.0).     Assessment/Plan:    82 year old male presents on his birthday for evaluation of right mid cervical pseudoaneurysm which is unchanged and he also has a distal left cervical ICA enlarged artery which appears to be a few to form aneurysm but I reviewed with neurosurgery there would be nothing to do to treat this as does not appear diseased.  As such we will follow him up with CT scan in 2 years.  We discussed the signs and symptoms of stroke and pseudoaneurysm rupture and he demonstrates good understanding.     Waynetta Sandy MD [facility]

## 2022-05-17 ENCOUNTER — Other Ambulatory Visit: Payer: Self-pay | Admitting: Cardiovascular Disease

## 2022-05-17 ENCOUNTER — Encounter: Payer: Self-pay | Admitting: Cardiovascular Disease

## 2022-05-17 NOTE — Telephone Encounter (Signed)
Ok to refill Amlodipine 2.5 mg once daily. Have pt send 2 weeks of BP readings. Tereso Newcomer, PA-C    05/17/2022 4:33 PM

## 2022-05-18 MED ORDER — AMLODIPINE BESYLATE 2.5 MG PO TABS: 2.5000 mg | ORAL_TABLET | Freq: Every day | ORAL | 3 refills | Status: DC

## 2022-08-05 ENCOUNTER — Other Ambulatory Visit: Payer: Self-pay | Admitting: Cardiovascular Disease

## 2022-08-24 ENCOUNTER — Ambulatory Visit (HOSPITAL_COMMUNITY)
Admission: RE | Admit: 2022-08-24 | Discharge: 2022-08-24 | Disposition: A | Discharge status: Home / Self Care | Payer: Medicare Other | Source: Ambulatory Visit | Attending: Physician Assistant | Admitting: Physician Assistant

## 2022-08-24 DIAGNOSIS — I2510 Atherosclerotic heart disease of native coronary artery without angina pectoris: Secondary | ICD-10-CM | POA: Insufficient documentation

## 2022-08-24 DIAGNOSIS — I671 Cerebral aneurysm, nonruptured: Secondary | ICD-10-CM | POA: Diagnosis present

## 2022-08-24 DIAGNOSIS — I2584 Coronary atherosclerosis due to calcified coronary lesion: Secondary | ICD-10-CM | POA: Insufficient documentation

## 2022-08-24 DIAGNOSIS — E7800 Pure hypercholesterolemia, unspecified: Secondary | ICD-10-CM | POA: Insufficient documentation

## 2022-08-24 MED ORDER — INCLISIRAN SODIUM 284 MG/1.5ML ~~LOC~~ SOSY
284.0000 mg | PREFILLED_SYRINGE | Freq: Once | SUBCUTANEOUS | Status: AC
  Administered 2022-08-24: 284 mg via SUBCUTANEOUS

## 2022-08-24 MED ORDER — INCLISIRAN SODIUM 284 MG/1.5ML ~~LOC~~ SOSY
PREFILLED_SYRINGE | SUBCUTANEOUS | Status: AC
  Filled 2022-08-24: qty 1.5

## 2022-09-06 ENCOUNTER — Inpatient Hospital Stay: Payer: Medicare Other | Attending: Internal Medicine

## 2022-09-06 ENCOUNTER — Telehealth: Payer: Self-pay | Admitting: Internal Medicine

## 2022-09-06 ENCOUNTER — Ambulatory Visit (HOSPITAL_COMMUNITY)
Admission: RE | Admit: 2022-09-06 | Discharge: 2022-09-06 | Disposition: A | Discharge status: Home / Self Care | Payer: Medicare Other | Source: Ambulatory Visit | Attending: Internal Medicine | Admitting: Internal Medicine

## 2022-09-06 DIAGNOSIS — C3490 Malignant neoplasm of unspecified part of unspecified bronchus or lung: Secondary | ICD-10-CM | POA: Insufficient documentation

## 2022-09-06 DIAGNOSIS — Z902 Acquired absence of lung [part of]: Secondary | ICD-10-CM | POA: Insufficient documentation

## 2022-09-06 DIAGNOSIS — Z85118 Personal history of other malignant neoplasm of bronchus and lung: Secondary | ICD-10-CM | POA: Insufficient documentation

## 2022-09-06 LAB — CBC WITH DIFFERENTIAL (CANCER CENTER ONLY)
Abs Immature Granulocytes: 0.01 10*3/uL (ref 0.00–0.07)
Basophils Absolute: 0 10*3/uL (ref 0.0–0.1)
Basophils Relative: 0 %
Eosinophils Absolute: 0.1 10*3/uL (ref 0.0–0.5)
Eosinophils Relative: 2 %
HCT: 41.4 % (ref 39.0–52.0)
Hemoglobin: 13.5 g/dL (ref 13.0–17.0)
Immature Granulocytes: 0 %
Lymphocytes Relative: 18 %
Lymphs Abs: 1 10*3/uL (ref 0.7–4.0)
MCH: 31.8 pg (ref 26.0–34.0)
MCHC: 32.6 g/dL (ref 30.0–36.0)
MCV: 97.4 fL (ref 80.0–100.0)
Monocytes Absolute: 0.4 10*3/uL (ref 0.1–1.0)
Monocytes Relative: 7 %
Neutro Abs: 4.2 10*3/uL (ref 1.7–7.7)
Neutrophils Relative %: 73 %
Platelet Count: 144 10*3/uL — ABNORMAL LOW (ref 150–400)
RBC: 4.25 MIL/uL (ref 4.22–5.81)
RDW: 14.9 % (ref 11.5–15.5)
WBC Count: 5.8 10*3/uL (ref 4.0–10.5)
nRBC: 0 % (ref 0.0–0.2)

## 2022-09-06 LAB — CMP (CANCER CENTER ONLY)
ALT: 20 U/L (ref 0–44)
AST: 24 U/L (ref 15–41)
Albumin: 3.9 g/dL (ref 3.5–5.0)
Alkaline Phosphatase: 71 U/L (ref 38–126)
Anion gap: 5 (ref 5–15)
BUN: 17 mg/dL (ref 8–23)
CO2: 27 mmol/L (ref 22–32)
Calcium: 9 mg/dL (ref 8.9–10.3)
Chloride: 107 mmol/L (ref 98–111)
Creatinine: 0.87 mg/dL (ref 0.61–1.24)
GFR, Estimated: >60 mL/min (ref >60)
Glucose, Bld: 103 mg/dL — ABNORMAL HIGH (ref 70–99)
Potassium: 3.9 mmol/L (ref 3.5–5.1)
Sodium: 139 mmol/L (ref 135–145)
Total Bilirubin: 0.6 mg/dL (ref 0.3–1.2)
Total Protein: 6.2 g/dL — ABNORMAL LOW (ref 6.5–8.1)

## 2022-09-06 MED ORDER — IOHEXOL 300 MG/ML  SOLN
75.0000 mL | Freq: Once | INTRAMUSCULAR | Status: AC | PRN
  Administered 2022-09-06: 75 mL via INTRAVENOUS

## 2022-09-06 NOTE — Telephone Encounter (Signed)
Rescheduled August appointments due to scheduling error, patient has been called and voicemail was left.

## 2022-09-08 ENCOUNTER — Inpatient Hospital Stay: Payer: Medicare Other | Admitting: Internal Medicine

## 2022-09-10 NOTE — Progress Notes (Unsigned)
[facility] OFFICE PROGRESS NOTE  Barbie Banner, MD [address] [place] [address]  DIAGNOSIS: Stage IA (T1c, N0, M0) non-small cell lung cancer, adenocarcinoma with predominant lipidic component   PRIOR THERAPY: status post right lower lobe superior segmentectomy with lymph node dissection under the care of Dr. Tyrone Sage on July 11, 2018   CURRENT THERAPY: Observation  INTERVAL HISTORY: Frederick Hall 82 y.o. male returns to the clinic for a 1 year follow-up visit.  The patient was last seen by Dr. Arbutus Ped on 09/08/2021.  The patient has a history of a stage Ia non-small cell lung cancer, adenocarcinoma.  He status post resection in 2020.  He is currently on observation and feeling well.  He denies any major changes in his health since he was last seen.  He is trying to lose weight and started weight watchers 4 to 5 weeks ago and lost 12 pounds.  He also was walking 4 miles a day without any limitations in his breathing. He mentions that since he started walking, that he was able to come off some of his BP medications. Denies any fever, chills, or night sweats. Denies any chest pain, shortness of breath, cough, or hemoptysis.  Denies any nausea, vomiting, diarrhea, or constipation.  Denies any headache or visual changes.  He recently had a restaging CT scan performed.  He is here today for evaluation and repeat blood work.   MEDICAL HISTORY: Past Medical History:  Diagnosis Date   Arthritis    Asthma 2005   "attack x 1"   BCC (basal cell carcinoma of skin) 04/15/2008   Right Forehead   BCC (basal cell carcinoma of skin) 08/26/2016   Mid Forehead   BPH (benign prostatic hyperplasia)    Erectile dysfunction    GERD (gastroesophageal reflux disease)    Headache    "Stabbing"   Hematuria 12/2016   History of acute prostatitis 12/2016   Hypertension    Lipoma 02/08/2014   Small subcutaneous mass right temporal region, noted on MRI Brain, pt unaware    Nodular basal cell carcinoma (BCC) 07/20/2016   Mid Forehead   Nodular basal cell carcinoma (BCC) 05/26/2017   Left Nare   Nodule of right lung 08/26/2017   18mm ground glass posterior nodule, noted on CT chest   Nodulo-ulcerative basal cell carcinoma (BCC) 10/21/2016   Right Shin   Osteoarthritis of left hip 08/07/2012   Osteoarthritis of right hip 05/23/2012   Prostate cancer (HCC)    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 Randie Heinz) 01/2021 - Plan f/u CTA in 1 year   Renal cyst, right 06/27/2017   Large peripelvic, noted on NM Bone scan   SCC (squamous cell carcinoma) 01/27/2009   Left Outer Brow - Well Diff   Seasonal allergies    Skin cancer    Squamous cell carcinoma in situ (SCCIS) 07/27/2013   Left Forehead   Superficial basal cell carcinoma (BCC) 10/14/2014   Tip of Nose   Wears hearing aid     ALLERGIES:  is allergic to penicillins, cefuroxime axetil, and other.  MEDICATIONS:  Current Outpatient Medications  Medication Sig Dispense Refill   acetaminophen (TYLENOL) 500 MG tablet Take 500-1,000 mg by mouth every 8 (eight) hours as needed for moderate pain or headache.     amLODipine (NORVASC) 2.5 MG tablet Take 1  tablet (2.5 mg total) by mouth daily. 180 tablet 3   aspirin 81 MG EC tablet      CALCIUM-MAGNESIUM-VITAMIN D PO Take 2 tablets by mouth daily.     ezetimibe (ZETIA) 10 MG tablet TAKE ONE TABLET BY MOUTH ONE TIME DAILY 90 tablet 1   famotidine (PEPCID) 20 MG tablet Take 20 mg by mouth daily before breakfast.      Inclisiran Sodium (LEQVIO [place]) Inject 284 mg into the skin every 6 (six) months.     lisinopril (ZESTRIL) 40 MG tablet Take 1 tablet (40 mg total) by mouth daily. 90 tablet 3   Multiple Vitamin (MULTIVITAMIN WITH MINERALS) TABS tablet Take 1 tablet by mouth daily.     Multiple Vitamins-Minerals (ZINC PO) Take 15 mg by mouth daily.       naproxen sodium (ALEVE) 220 MG tablet Take 220 mg by mouth 2 (two) times daily as needed (heavy activity (golf, exercise)).      phenylephrine (NEO-SYNEPHRINE) 1 % nasal spray Place 1 drop into both nostrils every 6 (six) hours as needed for congestion.     senna (SENOKOT) 8.6 MG TABS tablet Take 1 tablet by mouth daily.     vitamin C (ASCORBIC ACID) 500 MG tablet Take 500 mg by mouth daily.     No current facility-administered medications for this visit.    SURGICAL HISTORY:  Past Surgical History:  Procedure Laterality Date   COLONOSCOPY  09/2017   CYSTOSCOPY N/A 09/29/2017   Procedure: CYSTOSCOPY;  Surgeon: Marcine Matar, MD;  Location: [facility];  Service: Urology;  Laterality: N/A;  no seeds in bladder per Dr Donell Beers NERVE BLOCK  06/14/2018   Procedure: Intercostal Nerve Block;  Surgeon: Delight Ovens, MD;  Location: [facility];  Service: Thoracic;;   KNEE ARTHROSCOPY Left    LIPOMA EXCISION     multiple   LYMPH NODE DISSECTION  06/14/2018   Procedure: Lymph Node Dissection;  Surgeon: Delight Ovens, MD;  Location: [facility];  Service: Thoracic;;   RADIOACTIVE SEED IMPLANT N/A 09/29/2017   Procedure: RADIOACTIVE SEED IMPLANT/BRACHYTHERAPY IMPLANT;  Surgeon: Marcine Matar, MD;  Location: [facility];  Service: Urology;  Laterality: N/A;  63 seeds   SHOULDER ARTHROSCOPY Right    SPACE OAR INSTILLATION N/A 09/29/2017   Procedure: SPACE OAR INSTILLATION;  Surgeon: Marcine Matar, MD;  Location: [facility];  Service: Urology;  Laterality: N/A;   TONSILLECTOMY     TOTAL HIP ARTHROPLASTY Right 05/23/2012   Procedure: TOTAL HIP ARTHROPLASTY;  Surgeon: Eulas Post, MD;  Location: [facility];  Service: Orthopedics;  Laterality: Right;   TOTAL HIP ARTHROPLASTY Left 08/07/2012   Procedure: TOTAL HIP ARTHROPLASTY- left ;  Surgeon: Eulas Post, MD;  Location[facility];  Service: Orthopedics;  Laterality: Left;   VIDEO  ASSISTED THORACOSCOPY (VATS)/WEDGE RESECTION Right 06/14/2018   Procedure: VIDEO ASSISTED THORACOSCOPY (VATS) WITH LUNG RESECTION OF SUPERIOR SEGMENT OF RIGHT LOWER LOBE;  Surgeon: Delight Ovens, MD;  Location: [facility];  Service: Thoracic;  Laterality: Right;    REVIEW OF SYSTEMS:   Review of Systems  Constitutional: Negative for appetite change, chills, fatigue, fever and unexpected weight change.  HENT:   Negative for mouth sores, nosebleeds, sore throat and trouble swallowing.   Eyes: Negative for eye problems and icterus.  Respiratory: Negative for cough, hemoptysis, shortness of breath and wheezing.   Cardiovascular: Negative for chest pain and leg swelling.  Gastrointestinal: Negative for  abdominal pain, constipation, diarrhea, nausea and vomiting.  Genitourinary: Negative for bladder incontinence, difficulty urinating, dysuria, frequency and hematuria.   Musculoskeletal: Negative for back pain, gait problem, neck pain and neck stiffness.  Skin: Negative for itching and rash.  Neurological: Negative for dizziness, extremity weakness, gait problem, headaches, light-headedness and seizures.  Hematological: Negative for adenopathy. Does not bruise/bleed easily.  Psychiatric/Behavioral: Negative for confusion, depression and sleep disturbance. The patient is not nervous/anxious.     PHYSICAL EXAMINATION:  Blood pressure 129/78, pulse 60, temperature 97.7 F (36.5 C), temperature source Oral, resp. rate 17, weight 184 lb 11.2 oz (83.8 kg), SpO2 100%.  ECOG PERFORMANCE STATUS: 1 - Symptomatic but completely ambulatory  Physical Exam  Constitutional: Oriented to person, place, and time and well-developed, well-nourished, and in no distress. No distress.  HENT:  Head: Normocephalic and atraumatic.  Mouth/Throat: Oropharynx is clear and moist. No oropharyngeal exudate.  Eyes: Conjunctivae are normal. Right eye exhibits no discharge. Left eye exhibits no discharge. No scleral icterus.   Neck: Normal range of motion. Neck supple.  Cardiovascular: Normal rate, regular rhythm, normal heart sounds and intact distal pulses.   Pulmonary/Chest: Effort normal and breath sounds normal. No respiratory distress. No wheezes. No rales.  Abdominal: Soft. Bowel sounds are normal. Exhibits no distension and no mass. There is no tenderness.  Musculoskeletal: Normal range of motion. Exhibits no edema.  Lymphadenopathy:    No cervical adenopathy.  Neurological: Alert and oriented to person, place, and time. Exhibits normal muscle tone. Gait normal. Coordination normal.  Skin: Skin is warm and dry. No rash noted. Not diaphoretic. No erythema. No pallor.  Psychiatric: Mood, memory and judgment normal.  Vitals reviewed.  LABORATORY DATA: Lab Results  Component Value Date   WBC 5.8 09/06/2022   HGB 13.5 09/06/2022   HCT 41.4 09/06/2022   MCV 97.4 09/06/2022   PLT 144 (L) 09/06/2022      Chemistry      Component Value Date/Time   NA 139 09/06/2022 1006   NA 140 03/24/2021 0738   K 3.9 09/06/2022 1006   CL 107 09/06/2022 1006   CO2 27 09/06/2022 1006   BUN 17 09/06/2022 1006   BUN 12 03/24/2021 0738   CREATININE 0.87 09/06/2022 1006      Component Value Date/Time   CALCIUM 9.0 09/06/2022 1006   ALKPHOS 71 09/06/2022 1006   AST 24 09/06/2022 1006   ALT 20 09/06/2022 1006   BILITOT 0.6 09/06/2022 1006       RADIOGRAPHIC STUDIES:  CT Chest W Contrast  Result Date: 09/13/2022 CLINICAL DATA:  Follow-up non-small cell lung cancer EXAM: CT CHEST WITH CONTRAST TECHNIQUE: Multidetector CT imaging of the chest was performed during intravenous contrast administration. RADIATION DOSE REDUCTION: This exam was performed according to the departmental dose-optimization program which includes automated exposure control, adjustment of the mA and/or kV according to patient size and/or use of iterative reconstruction technique. CONTRAST:  75mL OMNIPAQUE IOHEXOL 300 MG/ML  SOLN COMPARISON:   07/31/2021 FINDINGS: Cardiovascular: The heart is top-normal in size. No pericardial effusion. No evidence of thoracic aortic aneurysm. Atherosclerotic calcifications of the aortic arch. Mediastinum/Nodes: No suspicious mediastinal lymphadenopathy. Visualized thyroid is unremarkable. Lungs/Pleura: Status post right lower lobe wedge resection. Mild subpleural reticulation in the left lower lobe. No focal consolidation. Mild ground-glass opacity in the right upper lobe (series 6/image 60), new, likely infectious inflammatory. 8 mm subpleural ground-glass nodule in the posterior right upper lobe (series 6/image 9), new. 7 mm triangular perifissural nodule  along the right minor fissure (series 6/image 91), unchanged, benign. 8 mm right lower lobe nodule (series 6/image 111), unchanged. 6 mm nodule at the lateral right lung base (series 6/image 130), unchanged. No new/suspicious solid nodules. No pleural effusion or pneumothorax. Upper Abdomen: Visualized upper abdomen is notable for bilateral renal cysts, including a 8.3 cm simple cyst in the medial right upper kidney (series 2/image 132), benign. No follow-up is recommended. Vascular calcifications. Musculoskeletal: Degenerative changes of the visualized thoracolumbar spine. IMPRESSION: Status post right lower lobe wedge resection. No findings suspicious for metastatic disease. Stable bilateral solid pulmonary nodules measuring up to 8 mm, unchanged, benign. Additional small ground-glass opacities in the right lung, new, possibly infectious/inflammatory. Aortic Atherosclerosis (ICD10-I70.0). Electronically Signed   By: Charline Bills M.D.   On: 09/13/2022 03:41     ASSESSMENT/PLAN:  This is a very pleasant 82 year old Caucasian male diagnosed with stage IA (T1c, N0, M0) non-small cell lung cancer, adenocarcinoma with predominant lipidic component status post right lower lobe superior segmentectomy with lymph node dissection under the care of Dr. Tyrone Sage on July 11, 2018   The patient was seen with Dr. Arbutus Ped today.  Dr. Arbutus Ped personally and independently reviewed the scan and discussed results with the patient today.  The scan showed no evidence of disease progression, there is a new 8 mm small ground glass opacity in the right lung, which could be inflammatory.  Dr. Arbutus Ped recommends a repeat CT scan in 6 months for close monitoring of this area.   Dr. Arbutus Ped recommends that he continue on observation with repeat CT scan of the chest in 6 months.   The patient was advised to call immediately if he has any concerning symptoms in the interval. The patient voices understanding of current disease status and treatment options and is in agreement with the current care plan. All questions were answered. The patient knows to call the clinic with any problems, questions or concerns. We can certainly see the patient much sooner if necessary   Orders Placed This Encounter  Procedures   CT Chest W Contrast    Standing Status:   Future    Standing Expiration Date:   09/15/2023    Order Specific Question:   If indicated for the ordered procedure, I authorize the administration of contrast media per Radiology protocol    Answer:   Yes    Order Specific Question:   Does the patient have a contrast media/X-ray dye allergy?    Answer:   No    Order Specific Question:   Preferred imaging location?    Answer:   [facility]   CBC with Differential (Cancer Center Only)    Standing Status:   Future    Standing Expiration Date:   09/15/2023   CMP (Cancer Center only)    Standing Status:   Future    Standing Expiration Date:   09/15/2023      Johnette Abraham Yuliya Nova, PA-C 09/15/22  ADDENDUM: Hematology/Oncology Attending: I had a face-to-face encounter with the patient today.  I reviewed his record, lab, scan and recommended his care plan.  This is a very pleasant 82 years old white male with a stage Ia non-small cell lung cancer, adenocarcinoma  status post right lower lobe superior segmentectomy with lymph node dissection on July 11, 2018.  The patient has been on observation since that time.  He is feeling fine and very active and exercises at regular basis. He had repeat CT scan of  the chest performed recently.  I personally and independently reviewed the scan images and discussed the result with the patient today. His scan showed no concerning findings for disease recurrence or metastasis but there was new 8 mm groundglass nodule in the right lung that likely inflammatory in origin but need close monitoring. I recommended for the patient to have repeat CT scan of the chest in 6 months for reevaluation of his condition and to rule out any disease recurrence in this area. He was advised to call immediately if he has any other concerning symptoms in the interval. The total time spent in the appointment was 20 minutes. Disclaimer: This note was dictated with voice recognition software. Similar sounding words can inadvertently be transcribed and may be missed upon review.

## 2022-09-15 ENCOUNTER — Inpatient Hospital Stay: Payer: Medicare Other | Attending: Physician Assistant | Admitting: Physician Assistant

## 2022-09-15 VITALS — BP 129/78 | HR 60 | Temp 97.7°F | Resp 17 | Wt 184.7 lb

## 2022-09-15 DIAGNOSIS — Z79899 Other long term (current) drug therapy: Secondary | ICD-10-CM | POA: Insufficient documentation

## 2022-09-15 DIAGNOSIS — R911 Solitary pulmonary nodule: Secondary | ICD-10-CM | POA: Insufficient documentation

## 2022-09-15 DIAGNOSIS — C3430 Malignant neoplasm of lower lobe, unspecified bronchus or lung: Secondary | ICD-10-CM | POA: Diagnosis not present

## 2022-09-15 DIAGNOSIS — Z902 Acquired absence of lung [part of]: Secondary | ICD-10-CM | POA: Diagnosis not present

## 2022-09-15 DIAGNOSIS — C3490 Malignant neoplasm of unspecified part of unspecified bronchus or lung: Secondary | ICD-10-CM

## 2022-09-15 DIAGNOSIS — Z85118 Personal history of other malignant neoplasm of bronchus and lung: Secondary | ICD-10-CM | POA: Insufficient documentation

## 2022-12-16 NOTE — Progress Notes (Signed)
Cardiology Office Note:    Date:  12/17/2022  ID:  Frederick Hall, DOB Mar 30, 1940, MRN [id number] PCP: Barbie Banner, MD  [facility] Providers Cardiologist:  Tonny Bollman, MD Cardiology APP:  Beatrice Lecher, PA-C       Patient Profile:      Coronary artery Ca2+ (CT in 07/2020) Aortic atherosclerosis  Cerebrovascular disease (R ICA pseudoaneurysm; enlarged L ICA CTA 01/09/21: R VA 100, L VA patent, 5 mm RICA pseudoaneurysm  Eval by Vasc Surgery 01/2021 (Dr. Randie Heinz) >> rpt CT planned in 01/2022 CT 04/02/2022: chronic dissection of R ICA with pseudoaneurysm, fusiform aneurysm of distal L ICA, chronic occlusion of R VA  Aortic atherosclerosis  Hypertension  Hyperlipidemia  Cardiomegaly  TTE 02/15/19: EF 60-65, mild LVH, gr 1 DD, no RWMA, normal RVSF, mod LAE, mild TR, mild AI, AV sclerosis w/o AS, mildly elevated PASP, RVSP 31.5  Lung CA s/p VATS w RLL resection in 2020 Ex smoker Prostate CA Rx: Lupron  BPH GERD           History of Present Illness:  Discussed the use of AI scribe software for clinical note transcription with the patient, who gave verbal consent to proceed.  Frederick Hall is a 82 y.o. male who returns for CAD, HTN. He was last seen in 12/2021.  He is here alone. The patient denies experiencing any dizziness or chest symptoms such as pain, pressure, tightness, or shortness of breath. There is no report of syncope or leg swelling. The patient is physically active, walking three to four miles daily without any noticeable increase in heart rate or shortness of breath. The patient also enjoys playing golf.     ROS   See HPI     Studies Reviewed:       Labs reviewed via [facility] 09/30/2021: LDL 69 09/06/2022: ALT 20, creatinine 0.7, hemoglobin 13.5    EKG personally reviewed: NSR, HR 63, normal axis, nonspecific ST-T wave changes, QTc 452, no change from prior tracing     Risk Assessment/Calculations:             Physical Exam:   VS:   BP 130/80   Pulse 63   Ht 5\' 9"  (1.753 m)   Wt 182 lb 12.8 oz (82.9 kg)   SpO2 98%   BMI 26.99 kg/m    Wt Readings from Last 3 Encounters:  12/17/22 182 lb 12.8 oz (82.9 kg)  09/15/22 184 lb 11.2 oz (83.8 kg)  04/14/22 193 lb 11.2 oz (87.9 kg)    Constitutional:      Appearance: Healthy appearance. Not in distress.  Neck:     Vascular: JVD normal.  Pulmonary:     Breath sounds: Normal breath sounds. No wheezing. No rales.  Cardiovascular:     Normal rate. Regular rhythm.     Murmurs: There is no murmur.  Edema:    Peripheral edema present.    Pretibial: bilateral trace edema of the pretibial area. Abdominal:     Palpations: Abdomen is soft.        Assessment and Plan:   Assessment & Plan Coronary artery calcification He is not having symptoms of angina.  He is active, walking on a daily basis.  Continue ASA 81 mg once daily, Zetia 10 mg once daily, Leqvio. He is statin intol. F/u 1 year.  Essential hypertension Blood pressure is well-controlled.  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  He is followed by vascular surgery (Dr. Randie Heinz). Pure hypercholesterolemia LDL optimal in September 2023.  He has upcoming labs with primary care.  Continue Zetia 10 mg daily, Leqvio. Aortic atherosclerosis (HCC) Continue ASA, Zetia, Inclisiran.       Dispo:  Return in about 1 year (around 12/17/2023) for Routine Follow Up, w/ Tereso Newcomer, PA-C.  Signed, Tereso Newcomer, PA-C

## 2022-12-17 ENCOUNTER — Ambulatory Visit: Payer: Medicare Other | Attending: Physician Assistant | Admitting: Physician Assistant

## 2022-12-17 ENCOUNTER — Encounter: Payer: Self-pay | Admitting: Physician Assistant

## 2022-12-17 VITALS — BP 130/80 | HR 63 | Ht 69.0 in | Wt 182.8 lb

## 2022-12-17 DIAGNOSIS — I720 Aneurysm of carotid artery: Secondary | ICD-10-CM | POA: Diagnosis present

## 2022-12-17 DIAGNOSIS — E7800 Pure hypercholesterolemia, unspecified: Secondary | ICD-10-CM | POA: Diagnosis not present

## 2022-12-17 DIAGNOSIS — I700 Atherosclerosis of aorta: Secondary | ICD-10-CM

## 2022-12-17 DIAGNOSIS — I10 Essential (primary) hypertension: Secondary | ICD-10-CM

## 2022-12-17 DIAGNOSIS — I2510 Atherosclerotic heart disease of native coronary artery without angina pectoris: Secondary | ICD-10-CM | POA: Diagnosis present

## 2022-12-17 NOTE — Assessment & Plan Note (Signed)
He has chronic dissection of R ICA with pseudoaneurysm, fusiform aneurysm of distal L ICA, chronic occlusion of R VA.  He is followed by vascular surgery (Dr. Randie Heinz).

## 2022-12-17 NOTE — Assessment & Plan Note (Signed)
Blood pressure is well-controlled.  Continue lisinopril 40 mg daily

## 2022-12-17 NOTE — Assessment & Plan Note (Addendum)
He is not having symptoms of angina.  He is active, walking on a daily basis.  Continue ASA 81 mg once daily, Zetia 10 mg once daily, Leqvio. He is statin intol. F/u 1 year.

## 2022-12-17 NOTE — Assessment & Plan Note (Signed)
LDL optimal in September 2023.  He has upcoming labs with primary care.  Continue Zetia 10 mg daily, Leqvio.

## 2022-12-17 NOTE — Patient Instructions (Signed)
Medication Instructions:  Your physician recommends that you continue on your current medications as directed. Please refer to the Current Medication list given to you today.  *If you need a refill on your cardiac medications before your next appointment, please call your pharmacy*   Lab Work: None ordered  If you have labs (blood work) drawn today and your tests are completely normal, you will receive your results only by: MyChart Message (if you have MyChart) OR A paper copy in the mail If you have any lab test that is abnormal or we need to change your treatment, we will call you to review the results.   Testing/Procedures: None ordered   Follow-Up: At  [facility], you and your health needs are our priority.  As part of our continuing mission to provide you with exceptional heart care, we have created designated Provider Care Teams.  These Care Teams include your primary Cardiologist (physician) and Advanced Practice Providers (APPs -  Physician Assistants and Nurse Practitioners) who all work together to provide you with the care you need, when you need it.  We recommend signing up for the patient portal called "MyChart".  Sign up information is provided on this After Visit Summary.  MyChart is used to connect with patients for Virtual Visits (Telemedicine).  Patients are able to view lab/test results, encounter notes, upcoming appointments, etc.  Non-urgent messages can be sent to your provider as well.   To learn more about what you can do with MyChart, go to [email or web address].    Your next appointment:   1 year(s)  Provider:   Scott Weaver, PA-C         Other Instructions   

## 2022-12-17 NOTE — Assessment & Plan Note (Addendum)
Continue ASA, Zetia, Inclisiran.

## 2023-01-28 ENCOUNTER — Other Ambulatory Visit: Payer: Self-pay | Admitting: Cardiovascular Disease

## 2023-02-11 ENCOUNTER — Other Ambulatory Visit: Payer: Self-pay | Admitting: Cardiovascular Disease

## 2023-02-24 ENCOUNTER — Encounter (HOSPITAL_COMMUNITY): Payer: Medicare Other

## 2023-02-28 ENCOUNTER — Other Ambulatory Visit (HOSPITAL_COMMUNITY): Payer: Self-pay | Admitting: *Deleted

## 2023-03-01 ENCOUNTER — Ambulatory Visit (HOSPITAL_COMMUNITY)
Admission: RE | Admit: 2023-03-01 | Discharge: 2023-03-01 | Disposition: A | Discharge status: Home / Self Care | Payer: Medicare Other | Source: Ambulatory Visit | Attending: Physician Assistant | Admitting: Physician Assistant

## 2023-03-01 DIAGNOSIS — I2510 Atherosclerotic heart disease of native coronary artery without angina pectoris: Secondary | ICD-10-CM | POA: Diagnosis present

## 2023-03-01 DIAGNOSIS — E785 Hyperlipidemia, unspecified: Secondary | ICD-10-CM | POA: Diagnosis present

## 2023-03-01 MED ORDER — INCLISIRAN SODIUM 284 MG/1.5ML ~~LOC~~ SOSY
PREFILLED_SYRINGE | SUBCUTANEOUS | Status: AC
  Administered 2023-03-01: 284 mg via SUBCUTANEOUS
  Filled 2023-03-01: qty 1.5

## 2023-03-01 MED ORDER — INCLISIRAN SODIUM 284 MG/1.5ML ~~LOC~~ SOSY: 284.0000 mg | PREFILLED_SYRINGE | Freq: Once | SUBCUTANEOUS | Status: AC

## 2023-03-08 ENCOUNTER — Telehealth: Payer: Self-pay

## 2023-03-08 NOTE — Telephone Encounter (Signed)
 Spoke with patient this afternoon about labs before CT scans.  Informed patient that labs are required to be done within 6 weeks of CT scan.  Patient verbalized understanding.

## 2023-03-14 NOTE — Telephone Encounter (Signed)
 Sent in doxycycline antibiotic.

## 2023-03-14 NOTE — Telephone Encounter (Signed)
 Left message on voice mail to have Patient return call. Call was to advise patient that Prescription has been sent to pharmacy for antibiotic.

## 2023-03-14 NOTE — Telephone Encounter (Signed)
 Pt seen last Friday by Dr. Lazoff.  URI has progressed over week and he is worse.  Was told to call back if that happened.  Hoping for an rx.  [facility].  Please let him know what we can do

## 2023-03-14 NOTE — Telephone Encounter (Signed)
 Spoke with patient he now has dry cough, drainage in throat, nasal congestion and runny nose.  Nasal discharge is yellowish/greenish with some red at times. Patient denies any fever and facial/teeth pain. Patient states that he knows he now has a sinus infection and has a history of chronic sinusitis. Patient does use a neti pot .

## 2023-03-14 NOTE — Telephone Encounter (Signed)
 Discuss with patient Prescription has been sent to pharmacy.

## 2023-03-15 ENCOUNTER — Inpatient Hospital Stay: Payer: Medicare Other | Attending: Physician Assistant

## 2023-03-15 ENCOUNTER — Encounter (HOSPITAL_COMMUNITY): Payer: Self-pay

## 2023-03-15 ENCOUNTER — Ambulatory Visit (HOSPITAL_COMMUNITY)
Admission: RE | Admit: 2023-03-15 | Discharge: 2023-03-15 | Disposition: A | Discharge status: Home / Self Care | Payer: Medicare Other | Source: Ambulatory Visit | Attending: Physician Assistant | Admitting: Physician Assistant

## 2023-03-15 DIAGNOSIS — Z87891 Personal history of nicotine dependence: Secondary | ICD-10-CM | POA: Diagnosis not present

## 2023-03-15 DIAGNOSIS — C3430 Malignant neoplasm of lower lobe, unspecified bronchus or lung: Secondary | ICD-10-CM | POA: Insufficient documentation

## 2023-03-15 DIAGNOSIS — Z902 Acquired absence of lung [part of]: Secondary | ICD-10-CM | POA: Diagnosis not present

## 2023-03-15 DIAGNOSIS — Z85118 Personal history of other malignant neoplasm of bronchus and lung: Secondary | ICD-10-CM | POA: Insufficient documentation

## 2023-03-15 DIAGNOSIS — Z79899 Other long term (current) drug therapy: Secondary | ICD-10-CM | POA: Insufficient documentation

## 2023-03-15 DIAGNOSIS — C3490 Malignant neoplasm of unspecified part of unspecified bronchus or lung: Secondary | ICD-10-CM | POA: Insufficient documentation

## 2023-03-15 LAB — CBC WITH DIFFERENTIAL (CANCER CENTER ONLY)
Abs Immature Granulocytes: 0.03 10*3/uL (ref 0.00–0.07)
Basophils Absolute: 0 10*3/uL (ref 0.0–0.1)
Basophils Relative: 0 %
Eosinophils Absolute: 0.7 10*3/uL — ABNORMAL HIGH (ref 0.0–0.5)
Eosinophils Relative: 6 %
HCT: 44.2 % (ref 39.0–52.0)
Hemoglobin: 14.6 g/dL (ref 13.0–17.0)
Immature Granulocytes: 0 %
Lymphocytes Relative: 12 %
Lymphs Abs: 1.3 10*3/uL (ref 0.7–4.0)
MCH: 31.5 pg (ref 26.0–34.0)
MCHC: 33 g/dL (ref 30.0–36.0)
MCV: 95.3 fL (ref 80.0–100.0)
Monocytes Absolute: 0.8 10*3/uL (ref 0.1–1.0)
Monocytes Relative: 8 %
Neutro Abs: 7.5 10*3/uL (ref 1.7–7.7)
Neutrophils Relative %: 74 %
Platelet Count: 200 10*3/uL (ref 150–400)
RBC: 4.64 MIL/uL (ref 4.22–5.81)
RDW: 14.7 % (ref 11.5–15.5)
WBC Count: 10.3 10*3/uL (ref 4.0–10.5)
nRBC: 0 % (ref 0.0–0.2)

## 2023-03-15 LAB — CMP (CANCER CENTER ONLY)
ALT: 41 U/L (ref 0–44)
AST: 37 U/L (ref 15–41)
Albumin: 4 g/dL (ref 3.5–5.0)
Alkaline Phosphatase: 82 U/L (ref 38–126)
Anion gap: 6 (ref 5–15)
BUN: 16 mg/dL (ref 8–23)
CO2: 27 mmol/L (ref 22–32)
Calcium: 9.3 mg/dL (ref 8.9–10.3)
Chloride: 104 mmol/L (ref 98–111)
Creatinine: 1 mg/dL (ref 0.61–1.24)
GFR, Estimated: >60 mL/min (ref >60)
Glucose, Bld: 118 mg/dL — ABNORMAL HIGH (ref 70–99)
Potassium: 4.3 mmol/L (ref 3.5–5.1)
Sodium: 137 mmol/L (ref 135–145)
Total Bilirubin: 0.7 mg/dL (ref 0.0–1.2)
Total Protein: 7 g/dL (ref 6.5–8.1)

## 2023-03-15 MED ORDER — IOHEXOL 300 MG/ML  SOLN
75.0000 mL | Freq: Once | INTRAMUSCULAR | Status: AC | PRN
  Administered 2023-03-15: 75 mL via INTRAVENOUS

## 2023-03-22 ENCOUNTER — Inpatient Hospital Stay (HOSPITAL_BASED_OUTPATIENT_CLINIC_OR_DEPARTMENT_OTHER): Payer: Medicare Other | Admitting: Internal Medicine

## 2023-03-22 VITALS — BP 124/79 | HR 64 | Temp 97.9°F | Resp 18 | Wt 186.9 lb

## 2023-03-22 DIAGNOSIS — C3490 Malignant neoplasm of unspecified part of unspecified bronchus or lung: Secondary | ICD-10-CM | POA: Diagnosis not present

## 2023-03-22 DIAGNOSIS — C3430 Malignant neoplasm of lower lobe, unspecified bronchus or lung: Secondary | ICD-10-CM | POA: Diagnosis not present

## 2023-03-22 NOTE — Progress Notes (Signed)
 [facility] [phone]   Fax:([phone]  OFFICE PROGRESS NOTE  Lazoff, Shawn P, DO [address] [place] [address]  DIAGNOSIS: Stage IA (T1c, N0, M0) non-small cell lung cancer, adenocarcinoma with predominant lipidic component    PRIOR THERAPY: status post right lower lobe superior segmentectomy with lymph node dissection under the care of Dr. Tyrone Sage on July 11, 2018    CURRENT THERAPY: Observation  INTERVAL HISTORY: Frederick Hall 83 y.o. male returns to the clinic today for follow-up visit.Discussed the use of AI scribe software for clinical note transcription with the patient, who gave verbal consent to proceed.  History of Present Illness   The patient is an 83 year old with stage one non-small cell lung cancer who presents for follow-up after right lower lobe segmentectomy.  He was diagnosed with stage one non-small cell lung cancer in June 2020 and underwent a right lower lobe segmentectomy. Since the surgery, he has been under observation and last visited approximately a year ago. A recent scan showed no evidence of recurrent or metastatic disease, but there was mild inflammation in the left lung, consistent with a cluster of multiple subsolid nodules. These findings are new compared to previous exams.  In the past year, he has experienced no new health issues. No chest pain, shortness of breath, or cough, except for coughing related to sinusitis and allergies. He had a recent sinus infection last week, treated with doxycycline, resulting in significant post-nasal drainage and coughing.       MEDICAL HISTORY: Past Medical History:  Diagnosis Date   Arthritis    Asthma 2005   "attack x 1"   BCC (basal cell carcinoma of skin) 04/15/2008   Right Forehead   BCC (basal cell carcinoma of skin) 08/26/2016   Mid Forehead   BPH (benign prostatic hyperplasia)    Erectile dysfunction    GERD (gastroesophageal reflux disease)     Headache    "Stabbing"   Hematuria 12/2016   History of acute prostatitis 12/2016   Hypertension    Lipoma 02/08/2014   Small subcutaneous mass right temporal region, noted on MRI Brain, pt unaware   Nodular basal cell carcinoma (BCC) 07/20/2016   Mid Forehead   Nodular basal cell carcinoma (BCC) 05/26/2017   Left Nare   Nodule of right lung 08/26/2017   18mm ground glass posterior nodule, noted on CT chest   Nodulo-ulcerative basal cell carcinoma (BCC) 10/21/2016   Right Shin   Osteoarthritis of left hip 08/07/2012   Osteoarthritis of right hip 05/23/2012   Prostate cancer (HCC)    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 Randie Heinz) 01/2021 - Plan f/u CTA in 1 year   Renal cyst, right 06/27/2017   Large peripelvic, noted on NM Bone scan   SCC (squamous cell carcinoma) 01/27/2009   Left Outer Brow - Well Diff   Seasonal allergies    Skin cancer    Squamous cell carcinoma in situ (SCCIS) 07/27/2013   Left Forehead   Superficial basal cell carcinoma (BCC) 10/14/2014   Tip of Nose   Wears hearing aid     ALLERGIES:  is allergic to penicillins, cefuroxime axetil, and other.  MEDICATIONS:  Current Outpatient Medications  Medication Sig Dispense Refill   acetaminophen (TYLENOL) 500 MG tablet Take 500-1,000 mg by mouth every 8 (  eight) hours as needed for moderate pain or headache.     aspirin 81 MG EC tablet      CALCIUM-MAGNESIUM-VITAMIN D PO Take 2 tablets by mouth daily.     ezetimibe (ZETIA) 10 MG tablet TAKE ONE TABLET BY MOUTH ONCE A DAY 90 tablet 3   famotidine (PEPCID) 20 MG tablet Take 20 mg by mouth daily before breakfast.      Inclisiran Sodium (LEQVIO [place]) Inject 284 mg into the skin every 6 (six) months.     lisinopril (ZESTRIL) 40 MG tablet TAKE 1 TABLET BY MOUTH EVERY DAY 90 tablet 3   Multiple Vitamin (MULTIVITAMIN WITH MINERALS) TABS tablet Take 1  tablet by mouth daily.     Multiple Vitamins-Minerals (ZINC PO) Take 15 mg by mouth daily.      naproxen sodium (ALEVE) 220 MG tablet Take 220 mg by mouth 2 (two) times daily as needed (heavy activity (golf, exercise)).      phenylephrine (NEO-SYNEPHRINE) 1 % nasal spray Place 1 drop into both nostrils every 6 (six) hours as needed for congestion.     senna (SENOKOT) 8.6 MG TABS tablet Take 1 tablet by mouth daily.     vitamin C (ASCORBIC ACID) 500 MG tablet Take 500 mg by mouth daily.     No current facility-administered medications for this visit.    SURGICAL HISTORY:  Past Surgical History:  Procedure Laterality Date   COLONOSCOPY  09/2017   CYSTOSCOPY N/A 09/29/2017   Procedure: CYSTOSCOPY;  Surgeon: Marcine Matar, MD;  Location[facility];  Service: Urology;  Laterality: N/A;  no seeds in bladder per Dr Donell Beers NERVE BLOCK  06/14/2018   Procedure: Intercostal Nerve Block;  Surgeon: Delight Ovens, MD;  Location: [facility] OR;  Service: Thoracic;;   KNEE ARTHROSCOPY Left    LIPOMA EXCISION     multiple   LYMPH NODE DISSECTION  06/14/2018   Procedure: Lymph Node Dissection;  Surgeon: Delight Ovens, MD;  Location: [facility] OR;  Service: Thoracic;;   RADIOACTIVE SEED IMPLANT N/A 09/29/2017   Procedure: RADIOACTIVE SEED IMPLANT/BRACHYTHERAPY IMPLANT;  Surgeon: Marcine Matar, MD;  Location: [facility];  Service: Urology;  Laterality: N/A;  63 seeds   SHOULDER ARTHROSCOPY Right    SPACE OAR INSTILLATION N/A 09/29/2017   Procedure: SPACE OAR INSTILLATION;  Surgeon: Marcine Matar, MD;  Location: [facility];  Service: Urology;  Laterality: N/A;   TONSILLECTOMY     TOTAL HIP ARTHROPLASTY Right 05/23/2012   Procedure: TOTAL HIP ARTHROPLASTY;  Surgeon: Eulas Post, MD;  Location: [facility];  Service: Orthopedics;  Laterality: Right;   TOTAL HIP ARTHROPLASTY Left 08/07/2012   Procedure: TOTAL HIP ARTHROPLASTY- left ;  Surgeon:  Eulas Post, MD;  Location: [facility] OR;  Service: Orthopedics;  Laterality: Left;   VIDEO ASSISTED THORACOSCOPY (VATS)/WEDGE RESECTION Right 06/14/2018   Procedure: VIDEO ASSISTED THORACOSCOPY (VATS) WITH LUNG RESECTION OF SUPERIOR SEGMENT OF RIGHT LOWER LOBE;  Surgeon: Delight Ovens, MD;  Location: [facility] OR;  Service: Thoracic;  Laterality: Right;    REVIEW OF SYSTEMS:  A comprehensive review of systems was negative except for: Ears, nose, mouth, throat, and face: positive for sinusitis    PHYSICAL EXAMINATION: General appearance: alert, cooperative, fatigued, and no distress Head: Normocephalic, without obvious abnormality, atraumatic Neck: no adenopathy, no JVD, supple, symmetrical, trachea midline, and thyroid not enlarged, symmetric, no tenderness/mass/nodules Lymph nodes: Cervical, supraclavicular, and axillary nodes normal. Resp: clear to auscultation bilaterally  Back: symmetric, no curvature. ROM normal. No CVA tenderness. Cardio: regular rate and rhythm, S1, S2 normal, no murmur, click, rub or gallop GI: soft, non-tender; bowel sounds normal; no masses,  no organomegaly Extremities: extremities normal, atraumatic, no cyanosis or edema  ECOG PERFORMANCE STATUS: 1 - Symptomatic but completely ambulatory  Blood pressure 124/79, pulse 64, temperature 97.9 F (36.6 C), temperature source Temporal, resp. rate 18, weight 186 lb 14.4 oz (84.8 kg), SpO2 100%.  LABORATORY DATA: Lab Results  Component Value Date   WBC 10.3 03/15/2023   HGB 14.6 03/15/2023   HCT 44.2 03/15/2023   MCV 95.3 03/15/2023   PLT 200 03/15/2023      Chemistry      Component Value Date/Time   NA 137 03/15/2023 0849   NA 140 03/24/2021 0738   K 4.3 03/15/2023 0849   CL 104 03/15/2023 0849   CO2 27 03/15/2023 0849   BUN 16 03/15/2023 0849   BUN 12 03/24/2021 0738   CREATININE 1.00 03/15/2023 0849      Component Value Date/Time   CALCIUM 9.3 03/15/2023 0849   ALKPHOS 82 03/15/2023 0849   AST 37  03/15/2023 0849   ALT 41 03/15/2023 0849   BILITOT 0.7 03/15/2023 0849       RADIOGRAPHIC STUDIES: CT Chest W Contrast Result Date: 03/22/2023 CLINICAL DATA:  Non-small cell lung cancer. Evaluate treatment response. * Tracking Code: BO * EXAM: CT CHEST WITH CONTRAST TECHNIQUE: Multidetector CT imaging of the chest was performed during intravenous contrast administration. RADIATION DOSE REDUCTION: This exam was performed according to the departmental dose-optimization program which includes automated exposure control, adjustment of the mA and/or kV according to patient size and/or use of iterative reconstruction technique. CONTRAST:  75mL OMNIPAQUE IOHEXOL 300 MG/ML  SOLN COMPARISON:  09/06/2022 FINDINGS: Cardiovascular: Heart size is normal. Aortic atherosclerosis. Coronary artery calcifications. No pericardial effusion. Mediastinum/Nodes: Thyroid gland, trachea, and esophagus are unremarkable. No enlarged mediastinal or hilar lymph nodes identified. Lungs/Pleura: No pleural effusion, airspace consolidation, atelectasis, or pneumothorax. Postsurgical changes are identified from previous right lower lobe wedge resection. No specific findings identified to suggest locally recurrent tumor. Perifissural nodule within the right midlung is stable measuring 0.8 cm, image 95/7. Nodule within the posterior right base measures 6 mm and is stable from previous exam. Within the superior segment of the left lower lobe there is a cluster of multiple sub solid nodules which are new compared with the previous exam. These measure up to 6 mm, image 88/7. The appearance strongly favors a inflammatory or infectious process Upper Abdomen: No acute abnormality. Cyst arising off the medial aspect of the right kidney measures 6.1 cm, image 173/2. No specific follow-up imaging recommended. Musculoskeletal: No chest wall abnormality. No acute or significant osseous findings. IMPRESSION: 1. Postsurgical changes are identified from  previous right lower lobe wedge resection. No specific findings identified to suggest locally recurrent tumor or metastatic disease. 2. Stable appearance of right lung nodules. 3. Within the superior segment of the left lower lobe there is a cluster of multiple sub solid nodules which are new compared with the previous exam. The appearance strongly favors a inflammatory or infectious process. Consider short-term interval follow-up with repeat CT of the chest in 3 months to ensure resolution. 4. Coronary artery calcifications. Electronically Signed   By: Signa Kell M.D.   On: 03/22/2023 11:18    ASSESSMENT AND PLAN: This is a very pleasant 83 years old white male with Stage IA (T1c, N0, M0) non-small cell  lung cancer, adenocarcinoma with predominant lipidic component  He is status post right lower lobe superior segmentectomy with lymph node dissection under the care of Dr. Tyrone Sage on July 11, 2018  The patient is currently on observation and feeling fine with no concerning complaints. He had repeat CT scan of the chest performed recently.  I personally and independently reviewed the scan and discussed the result with the patient today.    Stage I non-small cell lung cancer (NSCLC) Diagnosed in June 2020, status post right lower lobe severe segmentectomy. Currently under observation with no evidence of recurrent or metastatic disease. Recent scan shows mild inflammation in the left lung, likely due to recent sinusitis and infection. No concerning findings for local recurrence or metastasis. - Schedule follow-up scan in six months to monitor for any changes or recurrence.  Sinusitis Recent sinus infection treated with doxycycline, resulting in resolution of symptoms. Associated with post-nasal drainage and cough.   The patient was advised to call immediately if he has any concerning symptoms in the interval. The patient voices understanding of current disease status and treatment options and is in  agreement with the current care plan.  All questions were answered. The patient knows to call the clinic with any problems, questions or concerns. We can certainly see the patient much sooner if necessary.  The total time spent in the appointment was 20 minutes.  Disclaimer: This note was dictated with voice recognition software. Similar sounding words can inadvertently be transcribed and may not be corrected upon review.

## 2023-03-25 ENCOUNTER — Telehealth: Payer: Self-pay | Admitting: Internal Medicine

## 2023-03-25 NOTE — Telephone Encounter (Signed)
 Left the patient a voicemail with scheduled appointment details.

## 2023-03-30 ENCOUNTER — Other Ambulatory Visit: Payer: Self-pay | Admitting: Pharmacist

## 2023-03-30 DIAGNOSIS — I2510 Atherosclerotic heart disease of native coronary artery without angina pectoris: Secondary | ICD-10-CM

## 2023-03-30 DIAGNOSIS — E7800 Pure hypercholesterolemia, unspecified: Secondary | ICD-10-CM

## 2023-07-26 ENCOUNTER — Telehealth (HOSPITAL_COMMUNITY): Payer: Self-pay | Admitting: Pharmacy Technician

## 2023-07-26 NOTE — Telephone Encounter (Signed)
 Auth Submission: NO AUTH NEEDED Site of care: Site of care: [facility] Payer: Medicare A/B, USAA Supp Medication & CPT/J Code(s) submitted: Leqvio  (Inclisiran) J1306 Diagnosis Code: E78.00 Route of submission (phone, fax, portal):  Phone # Fax # Auth type: Buy/Bill HB Units/visits requested: 284mg  q 6 mths, x 2 doses Reference number:  Approval from: 07/26/23 to 02/11/24   Medicare A/B will cover 80%, USAA Supp will cover remaining 20%. Med will be covered at 100%.    Warren Lindahl, CPhT [facility] [phone]

## 2023-08-17 MED ORDER — AMLODIPINE BESYLATE 2.5 MG PO TABS: 2.5000 mg | ORAL_TABLET | Freq: Every day | ORAL | 3 refills | Status: DC

## 2023-08-17 NOTE — Addendum Note (Signed)
 Addended byBETHA FERRIER, GLENDIA T on: 08/17/2023 04:54 PM   Modules accepted: Orders

## 2023-08-26 ENCOUNTER — Other Ambulatory Visit (HOSPITAL_COMMUNITY): Payer: Self-pay | Admitting: *Deleted

## 2023-08-29 ENCOUNTER — Ambulatory Visit (HOSPITAL_COMMUNITY)
Admission: RE | Admit: 2023-08-29 | Discharge: 2023-08-29 | Disposition: A | Discharge status: Home / Self Care | Payer: Medicare Other | Source: Ambulatory Visit | Attending: Physician Assistant | Admitting: Physician Assistant

## 2023-08-29 DIAGNOSIS — E785 Hyperlipidemia, unspecified: Secondary | ICD-10-CM | POA: Insufficient documentation

## 2023-08-29 DIAGNOSIS — I2510 Atherosclerotic heart disease of native coronary artery without angina pectoris: Secondary | ICD-10-CM | POA: Insufficient documentation

## 2023-08-29 MED ORDER — INCLISIRAN SODIUM 284 MG/1.5ML ~~LOC~~ SOSY
284.0000 mg | PREFILLED_SYRINGE | Freq: Once | SUBCUTANEOUS | Status: AC
  Administered 2023-08-29: 284 mg via SUBCUTANEOUS

## 2023-08-29 MED ORDER — INCLISIRAN SODIUM 284 MG/1.5ML ~~LOC~~ SOSY
PREFILLED_SYRINGE | SUBCUTANEOUS | Status: AC
  Filled 2023-08-29: qty 1.5

## 2023-09-13 ENCOUNTER — Ambulatory Visit (HOSPITAL_COMMUNITY)
Admission: RE | Admit: 2023-09-13 | Discharge: 2023-09-13 | Disposition: A | Discharge status: Home / Self Care | Source: Ambulatory Visit | Attending: Internal Medicine | Admitting: Internal Medicine

## 2023-09-13 ENCOUNTER — Inpatient Hospital Stay: Attending: Internal Medicine

## 2023-09-13 ENCOUNTER — Inpatient Hospital Stay

## 2023-09-13 DIAGNOSIS — Z902 Acquired absence of lung [part of]: Secondary | ICD-10-CM | POA: Diagnosis not present

## 2023-09-13 DIAGNOSIS — Z87891 Personal history of nicotine dependence: Secondary | ICD-10-CM | POA: Insufficient documentation

## 2023-09-13 DIAGNOSIS — Z79899 Other long term (current) drug therapy: Secondary | ICD-10-CM | POA: Insufficient documentation

## 2023-09-13 DIAGNOSIS — C3490 Malignant neoplasm of unspecified part of unspecified bronchus or lung: Secondary | ICD-10-CM | POA: Insufficient documentation

## 2023-09-13 DIAGNOSIS — Z85118 Personal history of other malignant neoplasm of bronchus and lung: Secondary | ICD-10-CM | POA: Diagnosis present

## 2023-09-13 LAB — CBC WITH DIFFERENTIAL (CANCER CENTER ONLY)
Abs Immature Granulocytes: 0.04 K/uL (ref 0.00–0.07)
Basophils Absolute: 0 K/uL (ref 0.0–0.1)
Basophils Relative: 0 %
Eosinophils Absolute: 0.1 K/uL (ref 0.0–0.5)
Eosinophils Relative: 1 %
HCT: 44.6 % (ref 39.0–52.0)
Hemoglobin: 14.6 g/dL (ref 13.0–17.0)
Immature Granulocytes: 0 %
Lymphocytes Relative: 15 %
Lymphs Abs: 1.3 K/uL (ref 0.7–4.0)
MCH: 32.2 pg (ref 26.0–34.0)
MCHC: 32.7 g/dL (ref 30.0–36.0)
MCV: 98.2 fL (ref 80.0–100.0)
Monocytes Absolute: 0.6 K/uL (ref 0.1–1.0)
Monocytes Relative: 7 %
Neutro Abs: 6.9 K/uL (ref 1.7–7.7)
Neutrophils Relative %: 77 %
Platelet Count: 173 K/uL (ref 150–400)
RBC: 4.54 MIL/uL (ref 4.22–5.81)
RDW: 14.8 % (ref 11.5–15.5)
WBC Count: 9 K/uL (ref 4.0–10.5)
nRBC: 0 % (ref 0.0–0.2)

## 2023-09-13 LAB — CMP (CANCER CENTER ONLY)
ALT: 33 U/L (ref 0–44)
AST: 30 U/L (ref 15–41)
Albumin: 4.4 g/dL (ref 3.5–5.0)
Alkaline Phosphatase: 72 U/L (ref 38–126)
Anion gap: 12 (ref 5–15)
BUN: 21 mg/dL (ref 8–23)
CO2: 23 mmol/L (ref 22–32)
Calcium: 9.5 mg/dL (ref 8.9–10.3)
Chloride: 103 mmol/L (ref 98–111)
Creatinine: 0.97 mg/dL (ref 0.61–1.24)
GFR, Estimated: >60 mL/min (ref >60)
Glucose, Bld: 99 mg/dL (ref 70–99)
Potassium: 4 mmol/L (ref 3.5–5.1)
Sodium: 138 mmol/L (ref 135–145)
Total Bilirubin: 0.5 mg/dL (ref 0.0–1.2)
Total Protein: 6.6 g/dL (ref 6.5–8.1)

## 2023-09-13 MED ORDER — IOHEXOL 300 MG/ML  SOLN
100.0000 mL | Freq: Once | INTRAMUSCULAR | Status: AC | PRN
  Administered 2023-09-13: 80 mL via INTRAVENOUS

## 2023-09-21 ENCOUNTER — Inpatient Hospital Stay (HOSPITAL_BASED_OUTPATIENT_CLINIC_OR_DEPARTMENT_OTHER): Admitting: Internal Medicine

## 2023-09-21 ENCOUNTER — Telehealth: Payer: Self-pay | Admitting: Internal Medicine

## 2023-09-21 VITALS — BP 123/71 | HR 64 | Temp 97.1°F | Resp 17 | Wt 192.3 lb

## 2023-09-21 DIAGNOSIS — C3490 Malignant neoplasm of unspecified part of unspecified bronchus or lung: Secondary | ICD-10-CM | POA: Diagnosis not present

## 2023-09-21 DIAGNOSIS — Z85118 Personal history of other malignant neoplasm of bronchus and lung: Secondary | ICD-10-CM | POA: Diagnosis not present
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
## 2023-09-21 NOTE — Telephone Encounter (Signed)
 Scheduled patient appointments. Spoke with the patient and he is aware.

## 2023-09-27 ENCOUNTER — Other Ambulatory Visit (HOSPITAL_COMMUNITY): Payer: Self-pay | Admitting: Physician Assistant

## 2023-10-03 ENCOUNTER — Encounter (HOSPITAL_COMMUNITY): Payer: Self-pay | Admitting: Physician Assistant

## 2024-01-23 ENCOUNTER — Ambulatory Visit: Admitting: Cardiovascular Disease

## 2024-01-23 ENCOUNTER — Encounter: Payer: Self-pay | Admitting: Cardiovascular Disease

## 2024-01-23 VITALS — BP 144/80 | HR 62 | Ht 69.0 in | Wt 200.0 lb

## 2024-01-23 DIAGNOSIS — I700 Atherosclerosis of aorta: Secondary | ICD-10-CM | POA: Diagnosis not present

## 2024-01-23 DIAGNOSIS — I720 Aneurysm of carotid artery: Secondary | ICD-10-CM | POA: Diagnosis not present

## 2024-01-23 DIAGNOSIS — I2510 Atherosclerotic heart disease of native coronary artery without angina pectoris: Secondary | ICD-10-CM | POA: Diagnosis not present

## 2024-01-23 DIAGNOSIS — I10 Essential (primary) hypertension: Secondary | ICD-10-CM | POA: Diagnosis not present

## 2024-01-23 DIAGNOSIS — E7800 Pure hypercholesterolemia, unspecified: Secondary | ICD-10-CM | POA: Insufficient documentation

## 2024-01-23 MED ORDER — AMLODIPINE BESYLATE 5 MG PO TABS: 5.0000 mg | ORAL_TABLET | Freq: Every day | ORAL | 3 refills | Status: AC

## 2024-01-23 NOTE — Progress Notes (Signed)
 " Cardiology Office Note:    Date:  01/23/2024   ID:  Frederick Hall, DOB 1941/01/09, MRN [id number]  PCP:  Macarthur Elouise SQUIBB, DO    [facility] Providers Cardiologist:  Ozell Fell, MD Cardiology APP:  Lelon Glendia ONEIDA DEVONNA     Referring MD: Macarthur Elouise SQUIBB, DO   Chief Complaint  Patient presents with   Hypertension    History of Present Illness:    Frederick Hall is a 84 y.o. male with a hx of:  Coronary artery Ca2+ (CT in 07/2020) Aortic atherosclerosis  Cerebrovascular disease (R ICA pseudoaneurysm; enlarged L ICA CTA 01/09/21: R VA 100, L VA patent, 5 mm RICA pseudoaneurysm  Eval by Vasc Surgery 01/2021 (Dr. 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 He notes that his BP has been elevated over the past few months and attributes this to weight gain and needing to care for his wife who fractured her hip. He isn't able to exercise as much as in the past, now walking 2 miles rather than his 4 mile routine. He has been eating more pre-prepared foods. By our scales his weight is up 18 pounds from last year. Home BP's are running 140/80's. He reports no symptoms with his daily walk. Today, he denies symptoms of palpitations, chest pain, shortness of breath, orthopnea, PND, lower extremity edema, dizziness, or syncope.   Current Medications: Active Medications[1]   Allergies:   Penicillins, Cefuroxime axetil, and Other   ROS:   Please see the history of present illness.    All other systems reviewed and  are negative.  EKGs/Labs/Other Studies Reviewed:    The following studies were reviewed today: Cardiac Studies & Procedures   ______________________________________________________________________________________________     ECHOCARDIOGRAM  ECHOCARDIOGRAM COMPLETE 02/15/2019  Narrative ECHOCARDIOGRAM REPORT    Patient Name:   Frederick Hall [facility] Date of Exam: 02/15/2019 Medical Rec #:  [id number]                Height:       69.0 in Accession #:    [id number]               Weight:       191.1 lb Date of Birth:  [birth year]                 BSA:          2.03 m Patient Age:    78 years                 BP:           152/89 mmHg Patient Gender: M                        HR:           59 bpm. Exam Location:  [address]  Procedure: 2D Echo, 3D Echo, Cardiac Doppler and Color Doppler  Indications:    I51.7 cardiomegaly  History:  Patient has no prior history of Echocardiogram examinations. Risk Factors:Hypertension and HLD.  Sonographer:    Frederick Hall RCS Referring Phys: [id number] LORI C GERHARDT  IMPRESSIONS   1. Left ventricular ejection fraction, by visual estimation, is 60 to 65%. The left ventricle has normal function. There is mildly increased left ventricular hypertrophy. 2. Left ventricular diastolic parameters are consistent with Grade I diastolic dysfunction (impaired relaxation). 3. The left ventricle has no regional wall motion abnormalities. 4. Global right ventricle has normal systolic function.The right ventricular size is normal. No increase in right ventricular wall thickness. 5. Left atrial size was moderately dilated. 6. Right atrial size was normal. 7. The mitral valve is normal in structure. No evidence of mitral valve regurgitation. No evidence of mitral stenosis. 8. The tricuspid valve is normal in structure. 9. The tricuspid valve is normal in structure. Tricuspid valve regurgitation is mild. 10. The aortic valve is normal in structure. Aortic  valve regurgitation is mild. Mild to moderate aortic valve sclerosis/calcification without any evidence of aortic stenosis. 11. The pulmonic valve was normal in structure. Pulmonic valve regurgitation is not visualized. 12. Mildly elevated pulmonary artery systolic pressure. 13. The tricuspid regurgitant velocity is 2.67 m/s, and with an assumed right atrial pressure of 3 mmHg, the estimated right ventricular systolic pressure is mildly elevated at 31.5 mmHg. 14. The inferior vena cava is normal in size with greater than 50% respiratory variability, suggesting right atrial pressure of 3 mmHg.  FINDINGS Left Ventricle: Left ventricular ejection fraction, by visual estimation, is 60 to 65%. The left ventricle has normal function. The left ventricle has no regional wall motion abnormalities. There is mildly increased left ventricular hypertrophy. Concentric left ventricular hypertrophy. Left ventricular diastolic parameters are consistent with Grade I diastolic dysfunction (impaired relaxation). Normal left atrial pressure.  Right Ventricle: The right ventricular size is normal. No increase in right ventricular wall thickness. Global RV systolic function is has normal systolic function. The tricuspid regurgitant velocity is 2.67 m/s, and with an assumed right atrial pressure of 3 mmHg, the estimated right ventricular systolic pressure is mildly elevated at 31.5 mmHg.  Left Atrium: Left atrial size was moderately dilated.  Right Atrium: Right atrial size was normal in size  Pericardium: There is no evidence of pericardial effusion.  Mitral Valve: The mitral valve is normal in structure. No evidence of mitral valve regurgitation. No evidence of mitral valve stenosis by observation.  Tricuspid Valve: The tricuspid valve is normal in structure. Tricuspid valve regurgitation is mild.  Aortic Valve: The aortic valve is normal in structure. Aortic valve regurgitation is mild. Aortic regurgitation PHT  measures 518 msec. Mild to moderate aortic valve sclerosis/calcification is present, without any evidence of aortic stenosis.  Pulmonic Valve: The pulmonic valve was normal in structure. Pulmonic valve regurgitation is not visualized. Pulmonic regurgitation is not visualized.  Aorta: The aortic root, ascending aorta and aortic arch are all structurally normal, with no evidence of dilitation or obstruction.  Venous: The inferior vena cava is normal in size with greater than 50% respiratory variability, suggesting right atrial pressure of 3 mmHg.  IAS/Shunts: No atrial level shunt detected by color flow Doppler. There is no evidence of a patent foramen ovale. No ventricular septal defect is seen or detected. There is no evidence of an atrial septal defect.   LEFT VENTRICLE PLAX 2D LVIDd:         4.00 cm  Diastology LVIDs:         2.55 cm  LV  e' lateral:   6.85 cm/s LV PW:         1.16 cm  LV E/e' lateral: 7.2 LV IVS:        1.24 cm  LV e' medial:    5.11 cm/s LVOT diam:     1.70 cm  LV E/e' medial:  9.7 LV SV:         47 ml LV SV Index:   22.45 LVOT Area:     2.27 cm  3D Volume EF: 3D EF:        66 % LV EDV:       109 ml LV ESV:       37 ml LV SV:        72 ml  RIGHT VENTRICLE RV S prime:     15.20 cm/s TAPSE (M-mode): 2.4 cm  LEFT ATRIUM             Index       RIGHT ATRIUM           Index LA diam:        4.60 cm 2.27 cm/m  RA Area:     13.90 cm LA Vol (A2C):   80.0 ml 39.48 ml/m RA Volume:   31.20 ml  15.40 ml/m LA Vol (A4C):   59.9 ml 29.56 ml/m LA Biplane Vol: 72.1 ml 35.58 ml/m AORTIC VALVE LVOT Vmax:   87.90 cm/s LVOT Vmean:  69.400 cm/s LVOT VTI:    0.243 m AI PHT:      518 msec  AORTA Ao Root diam: 3.50 cm  MITRAL VALVE                        TRICUSPID VALVE MV Area (PHT):                      TR Peak grad:   28.5 mmHg MV PHT:                             TR Vmax:        267.00 cm/s MV Decel Time: 356 msec MV E velocity: 49.50 cm/s 103 cm/s  SHUNTS MV A  velocity: 70.80 cm/s 70.3 cm/s Systemic VTI:  0.24 m MV E/A ratio:  0.70       1.5       Systemic Diam: 1.70 cm   Leim Moose MD Electronically signed by Leim Moose MD Signature Date/Time: 02/15/2019/5:18:11 PM    Final          ______________________________________________________________________________________________      EKG:   EKG Interpretation Date/Time:  Monday January 23 2024 08:43:16 EST Ventricular Rate:  62 PR Interval:  176 QRS Duration:  86 QT Interval:  406 QTC Calculation: 412 R Axis:   63  Text Interpretation: Normal sinus rhythm Nonspecific T wave abnormality When compared with ECG of 17-Dec-2022 10:43, Nonspecific T wave abnormality now evident in Lateral leads Confirmed by Wonda Sharper [phone]) on 01/23/2024 8:49:00 AM    Recent Labs: 09/13/2023: ALT 33; BUN 21; Creatinine 0.97; Hemoglobin 14.6; Platelet Count 173; Potassium 4.0; Sodium 138  Recent Lipid Panel    Component Value Date/Time   CHOL 153 09/30/2021 0938   TRIG 82 09/30/2021 0938   HDL 69 09/30/2021 0938   CHOLHDL 2.2 09/30/2021 0938   LDLCALC 69 09/30/2021 0938        Physical Exam:  VS:  BP (!) 144/80 (BP Location: Left Arm, Patient Position: Sitting, Cuff Size: Normal)   Pulse 62   Ht 5' 9 (1.753 m)   Wt 200 lb (90.7 kg)   SpO2 99%   BMI 29.53 kg/m     Wt Readings from Last 3 Encounters:  01/23/24 200 lb (90.7 kg)  09/21/23 192 lb 4.8 oz (87.2 kg)  03/22/23 186 lb 14.4 oz (84.8 kg)     GEN:  Well nourished, well developed in no acute distress HEENT: Normal NECK: No JVD; No carotid bruits LYMPHATICS: No lymphadenopathy CARDIAC: RRR, no murmurs, rubs, gallops RESPIRATORY:  Clear to auscultation without rales, wheezing or rhonchi  ABDOMEN: Soft, non-tender, non-distended MUSCULOSKELETAL:  No edema; No deformity  SKIN: Warm and dry NEUROLOGIC:  Alert and oriented x 3 PSYCHIATRIC:  Normal affect   Assessment & Plan Coronary artery calcification Continue  aspirin, ezetimibe , and inclisiran.  No anginal symptoms.  EKG shows no acute changes. Pure hypercholesterolemia Treated with inclisiran and ezetimibe .  Cholesterol 174, triglycerides 65, HDL 80, LDL 80.  Statin intolerant.  Work on lifestyle modification. Essential hypertension Blood pressure control suboptimal.  See discussion above in the HPI.  Increase amlodipine  to 5 mg daily.  Continue lisinopril  40 mg daily.  Work on weight loss, increased exercise, and reduction in sodium intake. 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  Followed by vascular surgery.  No neurologic symptoms at present. Aortic atherosclerosis Continue low-dose aspirin and lipid-lowering therapies as outlined above.  The patient will call in for further medicine titration if his blood pressure remains elevated after the increase in amlodipine .  He has done well with hydrochlorothiazide  every other day in the past and we could add this back if needed.     Medication Adjustments/Labs and Tests Ordered: Current medicines are reviewed at length with the patient today.  Concerns regarding medicines are outlined above.  Orders Placed This Encounter  Procedures   EKG 12-Lead   Meds ordered this encounter  Medications   amLODipine  (NORVASC ) 5 MG tablet    Sig: Take 1 tablet (5 mg total) by mouth daily.    Dispense:  90 tablet    Refill:  3    Increasing to 5 mg    Patient Instructions  Medication Instructions:  INCREASE Amlodipine  to 5 mg once daily   *If you need a refill on your cardiac medications before your next appointment, please call your pharmacy*  Lab Work: None ordered today. If you have labs (blood work) drawn today and your tests are completely normal, you will receive your results only by: MyChart Message (if you have MyChart) OR A paper copy in the mail If you have any lab test that is abnormal or we need to change your  treatment, we will call you to review the results.  Testing/Procedures: None ordered today.  Follow-Up: At [facility], you and your health needs are our priority.  As part of our continuing mission to provide you with exceptional heart care, our providers are all part of one team.  This team includes your primary Cardiologist (physician) and Advanced Practice Providers or APPs (Physician Assistants and Nurse Practitioners) who all work together to provide you with the care you need, when you need it.  Your next appointment:   1 year(s)  Provider:   Glendia Ferrier, PA-C     Signed, Ozell Fell, MD  01/23/2024 1:44 PM    [facility]     [1]  Current Meds  Medication Sig   acetaminophen  (TYLENOL ) 500 MG tablet Take 500-1,000 mg by mouth every 8 (eight) hours as needed for moderate pain or headache.   aspirin 81 MG EC tablet    CALCIUM -MAGNESIUM -VITAMIN D PO Take 2 tablets by mouth daily.   ezetimibe  (ZETIA ) 10 MG tablet TAKE ONE TABLET BY MOUTH ONCE A DAY   famotidine  (PEPCID ) 20 MG tablet Take 20 mg by mouth daily before breakfast.    Inclisiran Sodium  (LEQVIO  [place]) Inject 284 mg into the skin every 6 (six) months.   lisinopril  (ZESTRIL ) 40 MG tablet TAKE 1 TABLET BY MOUTH EVERY DAY   Multiple Vitamin (MULTIVITAMIN WITH MINERALS) TABS tablet Take 1 tablet by mouth daily.   Multiple Vitamins-Minerals (ZINC PO) Take 15 mg by mouth daily.    naproxen sodium (ALEVE) 220 MG tablet Take 220 mg by mouth 2 (two) times daily as needed (heavy activity (golf, exercise)).    phenylephrine  (NEO-SYNEPHRINE) 1 % nasal spray Place 1 drop into both nostrils every 6 (six) hours as needed for congestion.   senna (SENOKOT) 8.6 MG TABS tablet Take 1 tablet by mouth daily.   vitamin C (ASCORBIC ACID) 500 MG tablet Take 500 mg by mouth daily.   [DISCONTINUED] amLODipine  (NORVASC ) 2.5 MG tablet Take 1 tablet (2.5 mg total) by mouth daily.   "

## 2024-01-23 NOTE — Assessment & Plan Note (Addendum)
 Treated with inclisiran and ezetimibe .  Cholesterol 174, triglycerides 65, HDL 80, LDL 80.  Statin intolerant.  Work on lifestyle modification.

## 2024-01-23 NOTE — Assessment & Plan Note (Addendum)
 Continue low-dose aspirin and lipid-lowering therapies as outlined above.

## 2024-01-23 NOTE — Assessment & Plan Note (Addendum)
 Continue aspirin, ezetimibe , and inclisiran.  No anginal symptoms.  EKG shows no acute changes.

## 2024-01-23 NOTE — Patient Instructions (Signed)
 Medication Instructions:  INCREASE Amlodipine  to 5 mg once daily   *If you need a refill on your cardiac medications before your next appointment, please call your pharmacy*  Lab Work: None ordered today. If you have labs (blood work) drawn today and your tests are completely normal, you will receive your results only by: MyChart Message (if you have MyChart) OR A paper copy in the mail If you have any lab test that is abnormal or we need to change your treatment, we will call you to review the results.  Testing/Procedures: None ordered today.  Follow-Up: At [facility], you and your health needs are our priority.  As part of our continuing mission to provide you with exceptional heart care, our providers are all part of one team.  This team includes your primary Cardiologist (physician) and Advanced Practice Providers or APPs (Physician Assistants and Nurse Practitioners) who all work together to provide you with the care you need, when you need it.  Your next appointment:   1 year(s)  Provider:   Glendia Ferrier, PA-C

## 2024-01-23 NOTE — Assessment & Plan Note (Addendum)
 Blood pressure control suboptimal.  See discussion above in the HPI.  Increase amlodipine  to 5 mg daily.  Continue lisinopril  40 mg daily.  Work on weight loss, increased exercise, and reduction in sodium intake.

## 2024-01-23 NOTE — Assessment & Plan Note (Addendum)
 Chronic dissection of the right ICA noted with pseudoaneurysm.  Followed by vascular surgery.  No neurologic symptoms at present.

## 2024-02-17 ENCOUNTER — Telehealth: Payer: Self-pay | Admitting: Cardiovascular Disease

## 2024-02-17 NOTE — Telephone Encounter (Signed)
" °*  STAT* If patient is at the pharmacy, call can be transferred to refill team.   1. Which medications need to be refilled? (please list name of each medication and dose if known)   ezetimibe  (ZETIA ) 10 MG tablet    ezetimibe  (ZETIA ) 10 MG tablet    2. Would you like to learn more about the convenience, safety, & potential cost savings by using the [facility]? No      3. Are you open to using the [facility] (Type [facility]. ). No    4. Which pharmacy/location (including street and city if local pharmacy) is medication to be sent to?[id number] - [place], [place] - [address] [place] [address]    5. Do they need a 30 day or 90 day supply? 90  "

## 2024-03-01 ENCOUNTER — Inpatient Hospital Stay (HOSPITAL_COMMUNITY): Admission: RE | Admit: 2024-03-01 | Source: Ambulatory Visit

## 2024-09-11 ENCOUNTER — Other Ambulatory Visit

## 2024-09-18 ENCOUNTER — Ambulatory Visit: Admitting: Internal Medicine
